# Patient Record
Sex: Male | Born: 1945
Health system: Southern US, Community
[De-identification: ages and names within clinical notes are randomized; demographics above are authoritative.]

## PROBLEM LIST (undated history)

## (undated) DIAGNOSIS — K760 Fatty (change of) liver, not elsewhere classified: Secondary | ICD-10-CM

## (undated) DIAGNOSIS — N400 Enlarged prostate without lower urinary tract symptoms: Secondary | ICD-10-CM

## (undated) DIAGNOSIS — M1990 Unspecified osteoarthritis, unspecified site: Secondary | ICD-10-CM

## (undated) DIAGNOSIS — E119 Type 2 diabetes mellitus without complications: Secondary | ICD-10-CM

## (undated) DIAGNOSIS — I2510 Atherosclerotic heart disease of native coronary artery without angina pectoris: Secondary | ICD-10-CM

## (undated) DIAGNOSIS — D649 Anemia, unspecified: Secondary | ICD-10-CM

## (undated) DIAGNOSIS — F419 Anxiety disorder, unspecified: Secondary | ICD-10-CM

## (undated) DIAGNOSIS — M51369 Other intervertebral disc degeneration, lumbar region without mention of lumbar back pain or lower extremity pain: Secondary | ICD-10-CM

## (undated) DIAGNOSIS — E785 Hyperlipidemia, unspecified: Secondary | ICD-10-CM

## (undated) DIAGNOSIS — J449 Chronic obstructive pulmonary disease, unspecified: Secondary | ICD-10-CM

## (undated) DIAGNOSIS — M5136 Other intervertebral disc degeneration, lumbar region: Secondary | ICD-10-CM

## (undated) DIAGNOSIS — K219 Gastro-esophageal reflux disease without esophagitis: Secondary | ICD-10-CM

## (undated) DIAGNOSIS — R0600 Dyspnea, unspecified: Secondary | ICD-10-CM

## (undated) DIAGNOSIS — I10 Essential (primary) hypertension: Secondary | ICD-10-CM

## (undated) DIAGNOSIS — R519 Headache, unspecified: Secondary | ICD-10-CM

## (undated) DIAGNOSIS — I4891 Unspecified atrial fibrillation: Secondary | ICD-10-CM

## (undated) HISTORY — DX: Unspecified atrial fibrillation: I48.91

## (undated) HISTORY — DX: Benign prostatic hyperplasia without lower urinary tract symptoms: N40.0

## (undated) HISTORY — DX: Other intervertebral disc degeneration, lumbar region without mention of lumbar back pain or lower extremity pain: M51.369

## (undated) HISTORY — DX: Other intervertebral disc degeneration, lumbar region: M51.36

## (undated) HISTORY — DX: Chronic obstructive pulmonary disease, unspecified: J44.9

## (undated) HISTORY — PX: TONSILLECTOMY: SUR1361

## (undated) HISTORY — DX: Fatty (change of) liver, not elsewhere classified: K76.0

## (undated) HISTORY — PX: HERNIA REPAIR: SHX51

## (undated) HISTORY — PX: BACK SURGERY: SHX140

---

## 2012-08-05 ENCOUNTER — Emergency Department (HOSPITAL_COMMUNITY): Payer: Medicare Other

## 2012-08-05 ENCOUNTER — Encounter (HOSPITAL_COMMUNITY): Payer: Self-pay | Admitting: Emergency Medicine

## 2012-08-05 ENCOUNTER — Emergency Department (HOSPITAL_COMMUNITY)
Admission: EM | Admit: 2012-08-05 | Discharge: 2012-08-05 | Disposition: A | Discharge status: Home / Self Care | Payer: Medicare Other | Attending: Emergency Medicine | Admitting: Emergency Medicine

## 2012-08-05 DIAGNOSIS — R350 Frequency of micturition: Secondary | ICD-10-CM | POA: Insufficient documentation

## 2012-08-05 DIAGNOSIS — M129 Arthropathy, unspecified: Secondary | ICD-10-CM | POA: Insufficient documentation

## 2012-08-05 DIAGNOSIS — Z79899 Other long term (current) drug therapy: Secondary | ICD-10-CM | POA: Insufficient documentation

## 2012-08-05 DIAGNOSIS — R300 Dysuria: Secondary | ICD-10-CM | POA: Insufficient documentation

## 2012-08-05 DIAGNOSIS — E119 Type 2 diabetes mellitus without complications: Secondary | ICD-10-CM | POA: Insufficient documentation

## 2012-08-05 DIAGNOSIS — N509 Disorder of male genital organs, unspecified: Secondary | ICD-10-CM | POA: Insufficient documentation

## 2012-08-05 DIAGNOSIS — E785 Hyperlipidemia, unspecified: Secondary | ICD-10-CM | POA: Insufficient documentation

## 2012-08-05 DIAGNOSIS — R310 Gross hematuria: Secondary | ICD-10-CM | POA: Insufficient documentation

## 2012-08-05 DIAGNOSIS — K219 Gastro-esophageal reflux disease without esophagitis: Secondary | ICD-10-CM | POA: Insufficient documentation

## 2012-08-05 DIAGNOSIS — F411 Generalized anxiety disorder: Secondary | ICD-10-CM | POA: Insufficient documentation

## 2012-08-05 DIAGNOSIS — I10 Essential (primary) hypertension: Secondary | ICD-10-CM | POA: Insufficient documentation

## 2012-08-05 DIAGNOSIS — R319 Hematuria, unspecified: Secondary | ICD-10-CM

## 2012-08-05 DIAGNOSIS — F17200 Nicotine dependence, unspecified, uncomplicated: Secondary | ICD-10-CM | POA: Insufficient documentation

## 2012-08-05 HISTORY — DX: Unspecified osteoarthritis, unspecified site: M19.90

## 2012-08-05 HISTORY — DX: Hyperlipidemia, unspecified: E78.5

## 2012-08-05 HISTORY — DX: Anxiety disorder, unspecified: F41.9

## 2012-08-05 HISTORY — DX: Gastro-esophageal reflux disease without esophagitis: K21.9

## 2012-08-05 HISTORY — DX: Type 2 diabetes mellitus without complications: E11.9

## 2012-08-05 HISTORY — DX: Essential (primary) hypertension: I10

## 2012-08-05 LAB — CBC WITH DIFFERENTIAL/PLATELET
Basophils Absolute: 0 10*3/uL (ref 0.0–0.1)
Basophils Relative: 0 % (ref 0–1)
Eosinophils Absolute: 0.2 10*3/uL (ref 0.0–0.7)
Eosinophils Relative: 2 % (ref 0–5)
HCT: 49.6 % (ref 39.0–52.0)
Hemoglobin: 16.9 g/dL (ref 13.0–17.0)
Lymphocytes Relative: 36 % (ref 12–46)
Lymphs Abs: 3.4 10*3/uL (ref 0.7–4.0)
MCH: 31.9 pg (ref 26.0–34.0)
MCHC: 34.1 g/dL (ref 30.0–36.0)
MCV: 93.8 fL (ref 78.0–100.0)
Monocytes Absolute: 0.7 10*3/uL (ref 0.1–1.0)
Monocytes Relative: 7 % (ref 3–12)
Neutro Abs: 5 10*3/uL (ref 1.7–7.7)
Neutrophils Relative %: 54 % (ref 43–77)
Platelets: 125 10*3/uL — ABNORMAL LOW (ref 150–400)
RBC: 5.29 MIL/uL (ref 4.22–5.81)
RDW: 12.9 % (ref 11.5–15.5)
WBC: 9.2 10*3/uL (ref 4.0–10.5)

## 2012-08-05 LAB — BASIC METABOLIC PANEL
BUN: 18 mg/dL (ref 6–23)
CO2: 26 mEq/L (ref 19–32)
Calcium: 9.9 mg/dL (ref 8.4–10.5)
Chloride: 99 mEq/L (ref 96–112)
Creatinine, Ser: 0.88 mg/dL (ref 0.50–1.35)
GFR calc Af Amer: >90 mL/min (ref >90)
GFR calc non Af Amer: 88 mL/min — ABNORMAL LOW (ref >90)
Glucose, Bld: 121 mg/dL — ABNORMAL HIGH (ref 70–99)
Potassium: 4.2 mEq/L (ref 3.5–5.1)
Sodium: 135 mEq/L (ref 135–145)

## 2012-08-05 LAB — URINALYSIS, ROUTINE W REFLEX MICROSCOPIC
Bilirubin Urine: NEGATIVE
Glucose, UA: NEGATIVE mg/dL
Ketones, ur: NEGATIVE mg/dL
Leukocytes, UA: NEGATIVE
Nitrite: NEGATIVE
Protein, ur: NEGATIVE mg/dL
Specific Gravity, Urine: 1.025 (ref 1.005–1.030)
Urobilinogen, UA: 0.2 mg/dL (ref 0.0–1.0)
pH: 5.5 (ref 5.0–8.0)

## 2012-08-05 LAB — URINE MICROSCOPIC-ADD ON

## 2012-08-05 MED ORDER — CIPROFLOXACIN HCL 500 MG PO TABS: 500.0000 mg | ORAL_TABLET | Freq: Two times a day (BID) | ORAL | Status: DC

## 2012-08-05 NOTE — ED Notes (Signed)
Pt c/o blood in urine that started yesterday. Pt states sometimes his urine is clear and sometimes is has blood/clots in it.

## 2012-08-05 NOTE — ED Provider Notes (Signed)
History   This chart was scribed for Nicolas Lyons, MD by Nicolas Berry, ED Scribe. The patient was seen in room APA07/APA07. Patient's care was started at 0912.  CSN: [id number]  Arrival date & time 08/05/12  4098   First MD Initiated Contact with Patient 08/05/12 [phone]      Chief Complaint  Patient presents with  . Hematuria   Patient is a 66 y.o. male presenting with hematuria. The history is provided by the patient. No language interpreter was used.  Hematuria This is a recurrent problem. The current episode started yesterday. The problem has been waxing and waning since onset. He describes the hematuria as gross hematuria. The hematuria occurs throughout his entire urinary stream. He reports clotting at the middle of his urine stream. The pain is mild. Urine color: bright red. Irritative symptoms do not include frequency or nocturia. Pertinent negatives include no abdominal pain, facial swelling, nausea or vomiting. He is sexually active. His past medical history is significant for hypertension and tobacco use. There is no history of kidney stones.    Nicolas Berry is a 66 y.o. male followed by the [facility] hospital who presents to the Emergency Department complaining of 1 day of recurrent, waxing and waning, moderate to sever bright red hematuria without dysuria. Pt typically experience mild hesitancy, increased frequency, and urgency at baseline, though symptoms represent a moderate deviation. He reports moderate testicular pain yesterday, though pain subsided prior to onset. Last night, Pt had "flush bright red, clot spotted" hematuria. The next 3 urination were described as abnormally clear, and this morning Pt's urine is persistently pink. Symptoms have not been treated PTA. Pt also reports blood in semen 2 weeks ago, after using a penile enlargement pump.No abdominal pain, fever, chills, cough, congestion, rhinorrhea, chest pain, SOB, or n/v/d. Pt denies h/o PSA testing or kidney stones.  Medical Hx includes HTN, GERD, Diabetes mellitus w/o complication, and hyperlipidemia. Pt is a current 2 pack/day smoker, admits the use of marijuana, and denies the consumption of alcohol.     Past Medical History  Diagnosis Date  . Hypertension   . Diabetes mellitus without complication   . GERD (gastroesophageal reflux disease)   . Hyperlipidemia   . Arthritis   . Anxiety     Past Surgical History  Procedure Date  . Hernia repair   . Back surgery     No family history on file.  History  Substance Use Topics  . Smoking status: Current Every Day Smoker -- 2.0 packs/day  . Smokeless tobacco: Not on file  . Alcohol Use: No      Review of Systems  HENT: Negative for facial swelling.   Gastrointestinal: Negative for nausea, vomiting and abdominal pain.  Genitourinary: Positive for hematuria and testicular pain. Negative for frequency and nocturia.  All other systems reviewed and are negative.    Allergies  Review of patient's allergies indicates no known allergies.  Home Medications   Current Outpatient Rx  Name  Route  Sig  Dispense  Refill  . ACARBOSE 50 MG PO TABS   Oral   Take 25 mg by mouth daily.         . ASPIRIN EC 81 MG PO TBEC   Oral   Take 81 mg by mouth daily.         Marland Kitchen CLONAZEPAM 0.5 MG PO TABS   Oral   Take 0.5 mg by mouth 2 (two) times daily as needed.         Marland Kitchen  HYDROCODONE-ACETAMINOPHEN 5-325 MG PO TABS   Oral   Take 1 tablet by mouth 2 (two) times daily.         Marland Kitchen LISINOPRIL 40 MG PO TABS   Oral   Take 40 mg by mouth daily.         Marland Kitchen METFORMIN HCL 1000 MG PO TABS   Oral   Take 1,000 mg by mouth 2 (two) times daily.         Marland Kitchen FISH OIL 1000 MG PO CAPS   Oral   Take 1,000 mg by mouth daily.          Marland Kitchen OMEPRAZOLE 20 MG PO CPDR   Oral   Take 20 mg by mouth daily.         Marland Kitchen SIMVASTATIN 80 MG PO TABS   Oral   Take 40 mg by mouth at bedtime.         Marland Kitchen VITAMIN D (ERGOCALCIFEROL) 50000 UNITS PO CAPS   Oral    Take 50,000 Units by mouth every 30 (thirty) days.           BP 167/85  Pulse 83  Temp 97.7 F (36.5 C) (Oral)  Resp 15  Ht 5\' 10"  (1.778 m)  Wt 189 lb (85.73 kg)  BMI 27.12 kg/m2  SpO2 97%  Physical Exam  Constitutional: He is oriented to person, place, and time. He appears well-developed and well-nourished.  HENT:  Head: Normocephalic and atraumatic.  Mouth/Throat: No oropharyngeal exudate.  Eyes: EOM are normal. Pupils are equal, round, and reactive to light. No scleral icterus.  Neck: Normal range of motion. Neck supple. No tracheal deviation present.  Cardiovascular: Normal rate, regular rhythm and normal heart sounds.   No murmur heard. Pulmonary/Chest: Effort normal and breath sounds normal. No respiratory distress. He has no wheezes.  Abdominal: Soft. He exhibits no mass. There is no rebound and no guarding.       Mild LLQ tenderness to palpation.  Musculoskeletal: Normal range of motion.       No CVA tenderness.  Neurological: He is alert and oriented to person, place, and time. No cranial nerve deficit.  Skin: Skin is warm and dry. No rash noted.  Psychiatric: He has a normal mood and affect. His behavior is normal.    ED Course  Procedures DIAGNOSTIC STUDIES: Oxygen Saturation is 97% on room air, normal by my interpretation.    COORDINATION OF CARE: 09:45- Evaluated Pt. Pt is awake, alert, and without distress. 09:56- Ordered CT Abdomen Pelvis Wo Contrast 1 time imaging.    Labs Reviewed  URINALYSIS, ROUTINE W REFLEX MICROSCOPIC - Abnormal; Notable for the following:    Hgb urine dipstick LARGE (*)     All other components within normal limits  CBC WITH DIFFERENTIAL - Abnormal; Notable for the following:    Platelets 125 (*)     All other components within normal limits  BASIC METABOLIC PANEL - Abnormal; Notable for the following:    Glucose, Bld 121 (*)     GFR calc non Af Amer 88 (*)     All other components within normal limits  URINE  MICROSCOPIC-ADD ON   Ct Abdomen Pelvis Wo Contrast  08/05/2012  *RADIOLOGY REPORT*  Clinical Data: Low back pain and hematuria.  CT ABDOMEN AND PELVIS WITHOUT CONTRAST  Technique:  Multidetector CT imaging of the abdomen and pelvis was performed following the standard protocol without intravenous contrast.  Comparison: None.  Findings: Lung bases are clear.  There  is no evidence for free air.  Unenhanced CT was performed per clinician order.  Lack of IV contrast limits sensitivity and specificity, especially for evaluation of abdominal/pelvic solid viscera.  There is no evidence for kidney stones or hydronephrosis.  There is diffuse low density throughout the liver parenchyma suggesting hepatic steatosis.  In addition, there are multiple low- density structures scattered throughout the liver parenchyma.  The largest lesion is in the inferior right hepatic lobe and measures up to 3.4 cm.  The Hounsfield units of the largest lesions are water attenuation.  These findings are indeterminate but most likely represent hepatic cysts.  Normal appearance of the spleen.  There is a 0.4 cm low density structure near the pancreatic head and neck.  Hounsfield units suggest that this may be a fatty lesion.  There are mitral annular calcifications.  No gross abnormality to the stomach, duodenum, adrenal glands or gallbladder.  There are prominent lymph nodes in the upper abdomen within the gastrohepatic ligament.  Largest node roughly measures 1.6 cm in the short axis on sequence #3, image 21. Precaval lymph node measures 1.2 cm in short axis on image 28. There are atherosclerotic calcifications involving the abdominal aorta without aneurysmal dilatation.  Right iliac lymph node measures 0.8 cm in the short axis on image 69.  Prostate is prominent for size and measures 5.2 cm in the transverse dimension with calcifications.  There is diverticulosis involving the sigmoid colon without evidence of acute inflammation. Normal  appearance of the appendix.  No evidence for bowel dilatation.  There is irregularity involving the right iliac wing this may be postsurgical or post-traumatic.  Hypertrophy of the facets in the lower lumbar spine consistent with degenerative changes.  There is a disc space loss at L5-S1 and disc bulge at L4-L5 with canal narrowing at this level.  IMPRESSION: No evidence for kidney stones.  Degenerative facet and disc disease in the lower lumbar spine. There is concern for central canal narrowing at L4-L5 as described. This could be further evaluated with a lumbar spine MRI if needed.  Diffuse low density of the liver is suggestive for hepatic steatosis.  There are multiple low-density structures throughout the liver and the largest have water attenuation.  Findings most likely represent cysts although they are indeterminate on this noncontrast examination.  In addition, the patient has prominent lymph nodes in the upper abdomen.  These findings are nonspecific but could be reactive.  Please correlate with any history of malignancy.  If there is concern for a neoplastic process, recommend further evaluation with MRI.  0.4 cm low density structure in the pancreatic neck is indeterminate and could represent focal fat.  Diverticulosis without acute inflammation.   Original Report Authenticated By: Richarda Overlie, M.D.      No diagnosis found.    MDM  The patient presents with painless hematuria.  The ua shows blood but no infection.  The ct reveals an enlarged prostate but no other acute process.  I will treat him with cipro for presumptive prostatitis and arrange follow up with urology in the next few days.        I personally performed the services described in this documentation, which was scribed in my presence. The recorded information has been reviewed and is accurate.      Nicolas Lyons, MD 08/05/12 1315

## 2012-08-23 ENCOUNTER — Other Ambulatory Visit (HOSPITAL_COMMUNITY): Payer: Self-pay | Admitting: Family Medicine

## 2012-08-23 DIAGNOSIS — R9389 Abnormal findings on diagnostic imaging of other specified body structures: Secondary | ICD-10-CM

## 2012-08-25 ENCOUNTER — Ambulatory Visit (HOSPITAL_COMMUNITY)
Admission: RE | Admit: 2012-08-25 | Discharge: 2012-08-25 | Disposition: A | Discharge status: Home / Self Care | Payer: Medicare Other | Source: Ambulatory Visit | Attending: Family Medicine | Admitting: Family Medicine

## 2012-08-25 DIAGNOSIS — R310 Gross hematuria: Secondary | ICD-10-CM | POA: Insufficient documentation

## 2012-08-25 DIAGNOSIS — R9389 Abnormal findings on diagnostic imaging of other specified body structures: Secondary | ICD-10-CM | POA: Insufficient documentation

## 2012-08-25 DIAGNOSIS — K7689 Other specified diseases of liver: Secondary | ICD-10-CM | POA: Insufficient documentation

## 2012-08-25 MED ORDER — GADOBENATE DIMEGLUMINE 529 MG/ML IV SOLN
17.0000 mL | Freq: Once | INTRAVENOUS | Status: AC | PRN
  Administered 2012-08-25: 17 mL via INTRAVENOUS

## 2012-09-09 ENCOUNTER — Ambulatory Visit (INDEPENDENT_AMBULATORY_CARE_PROVIDER_SITE_OTHER): Payer: Medicare Other | Admitting: Family Medicine

## 2012-09-09 ENCOUNTER — Encounter: Payer: Self-pay | Admitting: Family Medicine

## 2012-09-09 VITALS — BP 124/72 | HR 93 | Resp 18 | Ht 69.0 in | Wt 185.1 lb

## 2012-09-09 DIAGNOSIS — I10 Essential (primary) hypertension: Secondary | ICD-10-CM | POA: Insufficient documentation

## 2012-09-09 DIAGNOSIS — Z720 Tobacco use: Secondary | ICD-10-CM | POA: Insufficient documentation

## 2012-09-09 DIAGNOSIS — F411 Generalized anxiety disorder: Secondary | ICD-10-CM

## 2012-09-09 DIAGNOSIS — J449 Chronic obstructive pulmonary disease, unspecified: Secondary | ICD-10-CM

## 2012-09-09 DIAGNOSIS — E119 Type 2 diabetes mellitus without complications: Secondary | ICD-10-CM | POA: Insufficient documentation

## 2012-09-09 DIAGNOSIS — F17200 Nicotine dependence, unspecified, uncomplicated: Secondary | ICD-10-CM

## 2012-09-09 DIAGNOSIS — M159 Polyosteoarthritis, unspecified: Secondary | ICD-10-CM | POA: Insufficient documentation

## 2012-09-09 DIAGNOSIS — N400 Enlarged prostate without lower urinary tract symptoms: Secondary | ICD-10-CM | POA: Insufficient documentation

## 2012-09-09 DIAGNOSIS — E785 Hyperlipidemia, unspecified: Secondary | ICD-10-CM

## 2012-09-09 DIAGNOSIS — F419 Anxiety disorder, unspecified: Secondary | ICD-10-CM | POA: Insufficient documentation

## 2012-09-09 NOTE — Assessment & Plan Note (Signed)
He is being followed by urology. For hematuria as well as BPH. I will obtain records regarding this he is due for cystoscopy

## 2012-09-09 NOTE — Patient Instructions (Signed)
I will obtain records from Dr.Tapper/ Dr .Donnetta Hail Refer Gastroenterology for colonoscopy and swallowing Take the mirlax/PEG once a day in water or clear juice for bowel Work on the smoking! I will get your last set of labs  F/U 2 months for diabetes

## 2012-09-09 NOTE — Assessment & Plan Note (Signed)
He is a chronic smoker's cough. He smokes 2 packs a day. He would need an update PFT. I will obtain his records he states that he is on Symbicort and albuterol his pharmacy only has the Provera he may be getting the Symbicort from the [employer].

## 2012-09-09 NOTE — Assessment & Plan Note (Signed)
Continue statin therapy.

## 2012-09-09 NOTE — Assessment & Plan Note (Signed)
Advise him to take medications as prescribed. I will obtain his last set of labs and his A1c his fasting blood sugars look good.

## 2012-09-09 NOTE — Assessment & Plan Note (Signed)
Blood pressure looks good today we'll continue lisinopril at current dose.

## 2012-09-09 NOTE — Assessment & Plan Note (Signed)
We discussed tobacco cessation he has a very heavy smoker. He states that he wants to quit cold [place].

## 2012-09-09 NOTE — Assessment & Plan Note (Signed)
He'll continue his Norco prescribed by the [place], he typically takes this twice a day

## 2012-09-09 NOTE — Assessment & Plan Note (Signed)
He states that he has done very well. Advised not to stop the benzodiazepines cold [place] because of withdrawal symptoms. He can decrease to one tablet a day for while and we will see how he does at the next visit before stopping completely

## 2012-09-09 NOTE — Progress Notes (Signed)
  Subjective:    Patient ID: Nicolas Berry, male    DOB: 26-Jul-1946, 66 y.o.   MRN: [id number]  HPI Patient presents to establish care. Previous PCP Dr. Margo Common in [facility]. He is also followed at the [facility]. He was in the branch of the [employer] for 3 years. He made a career out of Warden/ranger.  Medications and history reviewed.  He is due for colonoscopy. He also has episodes of his pills and food getting stuck upon swallowing. He has pain after he eats most days. He uses omeprazole daily.  Anxiety his history of anxiety secondary to some stressful moments in his life. He's been taking Klonopin twice a day. He's never been on any long-acting medication. He wants to start tapering off of this. He does not think he needs at this time.  Chronic cough he has history of chronic cough initially did not state he has COPD but states he uses out the albuterol/ Symbicort inhaler. He smokes 2 packs per day. He's had a PFT to see this was done many years ago.  Diabetes mellitus states his last A1c was 7.1% he was started on Acarbose at the last visit he has been on metformin 1 g twice a day. His fasting blood sugars are typically 100-120. He does not take acarbose daily as prescribed  Chronic pain- history of degenerative disc disease and knee pain. He's on hydrocodone prescribed by the [place]. He also has osteoarthritis.  Hematuria his being worked up currently by urology for hematuria. Initially started on November 9 after coming back from her trip. He has CT scan which revealed fatty liver and enlarged prostate. He also has benign cyst on his liver. He is due for cystoscopy in January with [facility] Review of Systems  GEN- denies fatigue, fever, weight loss,weakness, recent illness HEENT- denies eye drainage, change in vision, nasal discharge, CVS- denies chest pain, palpitations RESP- denies SOB,+ cough, wheeze ABD- denies N/V, change in stools, abd  pain GU- denies dysuria, hematuria, dribbling, incontinence MSK- denies joint pain, muscle aches, injury Neuro- denies headache, dizziness, syncope, seizure activity, occ tingling numbness of feet       Objective:   Physical Exam GEN- NAD, alert and oriented x3 HEENT- PERRL, EOMI, non injected sclera, pink conjunctiva, MMM, oropharynx clear Neck- Supple,  CVS- RRR, no murmur RESP-CTAB, harsh smokers cough ABD-NABS,Soft,NT,ND  EXT- No edema Pulses- Radial, DP- 2+ Psych- normal affect and mood Diabetic- no open lesion, normal monofilament        Assessment & Plan:

## 2012-10-07 ENCOUNTER — Ambulatory Visit (INDEPENDENT_AMBULATORY_CARE_PROVIDER_SITE_OTHER): Payer: Medicare Other | Admitting: Urology

## 2012-10-07 DIAGNOSIS — N138 Other obstructive and reflux uropathy: Secondary | ICD-10-CM

## 2012-10-07 DIAGNOSIS — R310 Gross hematuria: Secondary | ICD-10-CM

## 2012-10-07 DIAGNOSIS — N529 Male erectile dysfunction, unspecified: Secondary | ICD-10-CM

## 2012-10-07 DIAGNOSIS — N401 Enlarged prostate with lower urinary tract symptoms: Secondary | ICD-10-CM

## 2013-02-07 ENCOUNTER — Ambulatory Visit (INDEPENDENT_AMBULATORY_CARE_PROVIDER_SITE_OTHER): Payer: Medicare Other | Admitting: Urology

## 2013-02-07 DIAGNOSIS — R310 Gross hematuria: Secondary | ICD-10-CM

## 2013-02-07 DIAGNOSIS — N400 Enlarged prostate without lower urinary tract symptoms: Secondary | ICD-10-CM

## 2013-02-07 DIAGNOSIS — N529 Male erectile dysfunction, unspecified: Secondary | ICD-10-CM

## 2013-04-07 ENCOUNTER — Ambulatory Visit: Payer: Medicare Other | Admitting: Urology

## 2013-05-16 ENCOUNTER — Ambulatory Visit (INDEPENDENT_AMBULATORY_CARE_PROVIDER_SITE_OTHER): Payer: Medicare Other | Admitting: Urology

## 2013-05-16 DIAGNOSIS — N400 Enlarged prostate without lower urinary tract symptoms: Secondary | ICD-10-CM

## 2014-05-29 ENCOUNTER — Ambulatory Visit: Payer: Medicare Other | Admitting: Urology

## 2014-07-10 ENCOUNTER — Ambulatory Visit (INDEPENDENT_AMBULATORY_CARE_PROVIDER_SITE_OTHER): Payer: Medicare Other | Admitting: Urology

## 2014-07-10 DIAGNOSIS — R351 Nocturia: Secondary | ICD-10-CM

## 2014-07-10 DIAGNOSIS — N400 Enlarged prostate without lower urinary tract symptoms: Secondary | ICD-10-CM

## 2015-07-30 ENCOUNTER — Ambulatory Visit: Payer: Medicare Other | Admitting: Urology

## 2016-05-17 ENCOUNTER — Inpatient Hospital Stay (HOSPITAL_COMMUNITY)
Admission: EM | Admit: 2016-05-17 | Discharge: 2016-05-19 | DRG: 309 | Disposition: A | Discharge status: Home / Self Care | Payer: Medicare Other | Attending: Internal Medicine | Admitting: Internal Medicine

## 2016-05-17 ENCOUNTER — Encounter (HOSPITAL_COMMUNITY): Payer: Self-pay | Admitting: Emergency Medicine

## 2016-05-17 ENCOUNTER — Emergency Department (HOSPITAL_COMMUNITY): Payer: Medicare Other

## 2016-05-17 DIAGNOSIS — I4891 Unspecified atrial fibrillation: Principal | ICD-10-CM

## 2016-05-17 DIAGNOSIS — Z833 Family history of diabetes mellitus: Secondary | ICD-10-CM

## 2016-05-17 DIAGNOSIS — Z720 Tobacco use: Secondary | ICD-10-CM | POA: Diagnosis present

## 2016-05-17 DIAGNOSIS — I10 Essential (primary) hypertension: Secondary | ICD-10-CM | POA: Diagnosis present

## 2016-05-17 DIAGNOSIS — Z7984 Long term (current) use of oral hypoglycemic drugs: Secondary | ICD-10-CM

## 2016-05-17 DIAGNOSIS — I248 Other forms of acute ischemic heart disease: Secondary | ICD-10-CM | POA: Diagnosis present

## 2016-05-17 DIAGNOSIS — N400 Enlarged prostate without lower urinary tract symptoms: Secondary | ICD-10-CM | POA: Diagnosis not present

## 2016-05-17 DIAGNOSIS — J449 Chronic obstructive pulmonary disease, unspecified: Secondary | ICD-10-CM | POA: Diagnosis not present

## 2016-05-17 DIAGNOSIS — E119 Type 2 diabetes mellitus without complications: Secondary | ICD-10-CM | POA: Diagnosis not present

## 2016-05-17 DIAGNOSIS — Z23 Encounter for immunization: Secondary | ICD-10-CM

## 2016-05-17 DIAGNOSIS — K219 Gastro-esophageal reflux disease without esophagitis: Secondary | ICD-10-CM | POA: Diagnosis present

## 2016-05-17 DIAGNOSIS — J439 Emphysema, unspecified: Secondary | ICD-10-CM | POA: Diagnosis not present

## 2016-05-17 DIAGNOSIS — I480 Paroxysmal atrial fibrillation: Secondary | ICD-10-CM | POA: Diagnosis not present

## 2016-05-17 DIAGNOSIS — Z79891 Long term (current) use of opiate analgesic: Secondary | ICD-10-CM

## 2016-05-17 DIAGNOSIS — D72829 Elevated white blood cell count, unspecified: Secondary | ICD-10-CM | POA: Diagnosis present

## 2016-05-17 DIAGNOSIS — Z7982 Long term (current) use of aspirin: Secondary | ICD-10-CM

## 2016-05-17 DIAGNOSIS — K5900 Constipation, unspecified: Secondary | ICD-10-CM | POA: Diagnosis present

## 2016-05-17 DIAGNOSIS — M159 Polyosteoarthritis, unspecified: Secondary | ICD-10-CM | POA: Diagnosis present

## 2016-05-17 DIAGNOSIS — R7989 Other specified abnormal findings of blood chemistry: Secondary | ICD-10-CM

## 2016-05-17 DIAGNOSIS — F17210 Nicotine dependence, cigarettes, uncomplicated: Secondary | ICD-10-CM | POA: Diagnosis present

## 2016-05-17 DIAGNOSIS — I959 Hypotension, unspecified: Secondary | ICD-10-CM | POA: Diagnosis present

## 2016-05-17 DIAGNOSIS — E785 Hyperlipidemia, unspecified: Secondary | ICD-10-CM | POA: Diagnosis present

## 2016-05-17 DIAGNOSIS — Z8052 Family history of malignant neoplasm of bladder: Secondary | ICD-10-CM

## 2016-05-17 DIAGNOSIS — Z7951 Long term (current) use of inhaled steroids: Secondary | ICD-10-CM

## 2016-05-17 DIAGNOSIS — R778 Other specified abnormalities of plasma proteins: Secondary | ICD-10-CM | POA: Diagnosis present

## 2016-05-17 DIAGNOSIS — R079 Chest pain, unspecified: Secondary | ICD-10-CM

## 2016-05-17 DIAGNOSIS — K760 Fatty (change of) liver, not elsewhere classified: Secondary | ICD-10-CM | POA: Diagnosis not present

## 2016-05-17 DIAGNOSIS — Z7901 Long term (current) use of anticoagulants: Secondary | ICD-10-CM

## 2016-05-17 DIAGNOSIS — R0602 Shortness of breath: Secondary | ICD-10-CM | POA: Diagnosis not present

## 2016-05-17 LAB — URINALYSIS, ROUTINE W REFLEX MICROSCOPIC
Bilirubin Urine: NEGATIVE
Glucose, UA: NEGATIVE mg/dL
Hgb urine dipstick: NEGATIVE
Leukocytes, UA: NEGATIVE
Nitrite: NEGATIVE
Protein, ur: NEGATIVE mg/dL
Specific Gravity, Urine: 1.02 (ref 1.005–1.030)
pH: 5.5 (ref 5.0–8.0)

## 2016-05-17 LAB — CBC
HCT: 46.3 % (ref 39.0–52.0)
Hemoglobin: 15.5 g/dL (ref 13.0–17.0)
MCH: 32 pg (ref 26.0–34.0)
MCHC: 33.5 g/dL (ref 30.0–36.0)
MCV: 95.7 fL (ref 78.0–100.0)
Platelets: 156 10*3/uL (ref 150–400)
RBC: 4.84 MIL/uL (ref 4.22–5.81)
RDW: 13.4 % (ref 11.5–15.5)
WBC: 13.4 10*3/uL — ABNORMAL HIGH (ref 4.0–10.5)

## 2016-05-17 LAB — BASIC METABOLIC PANEL
Anion gap: 7 (ref 5–15)
BUN: 25 mg/dL — ABNORMAL HIGH (ref 6–20)
CO2: 23 mmol/L (ref 22–32)
Calcium: 9.4 mg/dL (ref 8.9–10.3)
Chloride: 107 mmol/L (ref 101–111)
Creatinine, Ser: 1.24 mg/dL (ref 0.61–1.24)
GFR calc Af Amer: >60 mL/min (ref >60)
GFR calc non Af Amer: 58 mL/min — ABNORMAL LOW (ref >60)
Glucose, Bld: 106 mg/dL — ABNORMAL HIGH (ref 65–99)
Potassium: 4 mmol/L (ref 3.5–5.1)
Sodium: 137 mmol/L (ref 135–145)

## 2016-05-17 LAB — PROTIME-INR
INR: 1.05
Prothrombin Time: 13.7 seconds (ref 11.4–15.2)

## 2016-05-17 LAB — TSH: TSH: 2.164 u[IU]/mL (ref 0.350–4.500)

## 2016-05-17 LAB — APTT: aPTT: 34 seconds (ref 24–36)

## 2016-05-17 LAB — D-DIMER, QUANTITATIVE: D-Dimer, Quant: 0.44 ug/mL-FEU (ref 0.00–0.50)

## 2016-05-17 LAB — MAGNESIUM: Magnesium: 1.5 mg/dL — ABNORMAL LOW (ref 1.7–2.4)

## 2016-05-17 LAB — TROPONIN I: Troponin I: 0.03 ng/mL (ref <0.03)

## 2016-05-17 MED ORDER — FINASTERIDE 5 MG PO TABS
5.0000 mg | ORAL_TABLET | Freq: Every day | ORAL | Status: DC
  Administered 2016-05-18 – 2016-05-19 (×2): 5 mg via ORAL
  Filled 2016-05-17 (×3): qty 1

## 2016-05-17 MED ORDER — CLONAZEPAM 0.5 MG PO TABS: 0.5000 mg | ORAL_TABLET | Freq: Two times a day (BID) | ORAL | Status: DC | PRN

## 2016-05-17 MED ORDER — MOMETASONE FURO-FORMOTEROL FUM 200-5 MCG/ACT IN AERO
2.0000 | INHALATION_SPRAY | Freq: Two times a day (BID) | RESPIRATORY_TRACT | Status: DC
  Administered 2016-05-18 – 2016-05-19 (×2): 2 via RESPIRATORY_TRACT
  Filled 2016-05-17: qty 8.8

## 2016-05-17 MED ORDER — ASPIRIN EC 81 MG PO TBEC
81.0000 mg | DELAYED_RELEASE_TABLET | Freq: Every day | ORAL | Status: DC
  Administered 2016-05-18 – 2016-05-19 (×2): 81 mg via ORAL
  Filled 2016-05-17 (×2): qty 1

## 2016-05-17 MED ORDER — HEPARIN BOLUS VIA INFUSION
4000.0000 [IU] | Freq: Once | INTRAVENOUS | Status: AC
  Administered 2016-05-17: 4000 [IU] via INTRAVENOUS

## 2016-05-17 MED ORDER — ASPIRIN 81 MG PO CHEW
324.0000 mg | CHEWABLE_TABLET | Freq: Once | ORAL | Status: AC
  Administered 2016-05-17: 324 mg via ORAL
  Filled 2016-05-17: qty 4

## 2016-05-17 MED ORDER — SODIUM CHLORIDE 0.9 % IV SOLN
INTRAVENOUS | Status: DC
  Administered 2016-05-17: 10 mL/h via INTRAVENOUS
  Administered 2016-05-18: 16:00:00 via INTRAVENOUS

## 2016-05-17 MED ORDER — INSULIN ASPART 100 UNIT/ML ~~LOC~~ SOLN
0.0000 [IU] | Freq: Three times a day (TID) | SUBCUTANEOUS | Status: DC
  Administered 2016-05-18 – 2016-05-19 (×2): 2 [IU] via SUBCUTANEOUS

## 2016-05-17 MED ORDER — DILTIAZEM HCL 100 MG IV SOLR
5.0000 mg/h | INTRAVENOUS | Status: DC
  Administered 2016-05-17: 5 mg/h via INTRAVENOUS
  Filled 2016-05-17: qty 100

## 2016-05-17 MED ORDER — ATORVASTATIN CALCIUM 40 MG PO TABS
40.0000 mg | ORAL_TABLET | Freq: Every day | ORAL | Status: DC
  Administered 2016-05-18 – 2016-05-19 (×2): 40 mg via ORAL
  Filled 2016-05-17 (×2): qty 1

## 2016-05-17 MED ORDER — DILTIAZEM HCL 100 MG IV SOLR
5.0000 mg/h | INTRAVENOUS | Status: DC
  Administered 2016-05-17: 7.5 mg/h via INTRAVENOUS

## 2016-05-17 MED ORDER — ONDANSETRON HCL 4 MG/2ML IJ SOLN
4.0000 mg | Freq: Four times a day (QID) | INTRAMUSCULAR | Status: DC | PRN
Start: 2016-05-17 — End: 2016-05-19

## 2016-05-17 MED ORDER — SODIUM CHLORIDE 0.9 % IV BOLUS (SEPSIS)
500.0000 mL | Freq: Once | INTRAVENOUS | Status: AC
  Administered 2016-05-17: 500 mL via INTRAVENOUS

## 2016-05-17 MED ORDER — HEPARIN (PORCINE) IN NACL 100-0.45 UNIT/ML-% IJ SOLN
1000.0000 [IU]/h | INTRAMUSCULAR | Status: DC
  Administered 2016-05-17 – 2016-05-18 (×2): 1000 [IU]/h via INTRAVENOUS
  Filled 2016-05-17 (×2): qty 250

## 2016-05-17 MED ORDER — OXYCODONE-ACETAMINOPHEN 5-325 MG PO TABS: 1.0000 | ORAL_TABLET | Freq: Four times a day (QID) | ORAL | Status: DC | PRN

## 2016-05-17 MED ORDER — PNEUMOCOCCAL VAC POLYVALENT 25 MCG/0.5ML IJ INJ
0.5000 mL | INJECTION | INTRAMUSCULAR | Status: AC
  Administered 2016-05-18: 0.5 mL via INTRAMUSCULAR
  Filled 2016-05-17: qty 0.5

## 2016-05-17 MED ORDER — DILTIAZEM LOAD VIA INFUSION
10.0000 mg | Freq: Once | INTRAVENOUS | Status: AC
Start: 2016-05-17 — End: 2016-05-17
  Administered 2016-05-17: 10 mg via INTRAVENOUS
  Filled 2016-05-17: qty 10

## 2016-05-17 NOTE — ED Notes (Signed)
Attempted to call report to ICU at 2016 was told they would call me back. Re-attempt to call report made by myself at this time spoke with Jeneen Rinks who said it'd be another half an hour.

## 2016-05-17 NOTE — ED Provider Notes (Signed)
[name] DEPT Provider Note   CSN: [id number] Arrival date & time: 05/17/16  1621     History   Chief Complaint Chief Complaint  Patient presents with  . Hypotension    HPI Nicolas Berry is a 70 y.o. male.  The history is provided by the patient and the spouse.  Patient presents with generalized weakness. States he feels bad. States that he became dizzy. States he has felt like this over the last few weeks when he takes his lisinopril and metformin. States it got worse today. Does not feel his heart racing. He has had swelling in his right leg and needs a knee replacement. Also has some shortness of breath. States he was golfing today and began to feel worse. No real chest pain. Has swelling in his right knee. No fevers or chills.  Found to be in new onset A. fib with RVR. No history of A. fib. Chadsvasc score of 3 for age hypertension and diabetes.  Past Medical History:  Diagnosis Date  . Anxiety    OA multiple joints  . Arthritis   . BPH (benign prostatic hyperplasia)   . COPD (chronic obstructive pulmonary disease) ([place])   . DDD (degenerative disc disease), lumbar   . Diabetes mellitus without complication ([place])   . Fatty liver   . GERD (gastroesophageal reflux disease)   . Hyperlipidemia   . Hypertension     Patient Active Problem List   Diagnosis Date Noted  . Diabetes mellitus ([place]) 09/09/2012  . Essential hypertension, benign 09/09/2012  . Hyperlipidemia 09/09/2012  . BPH (benign prostatic hyperplasia) 09/09/2012  . COPD (chronic obstructive pulmonary disease) ([place]) 09/09/2012  . Generalized OA 09/09/2012  . Anxiety 09/09/2012  . Tobacco use 09/09/2012    Past Surgical History:  Procedure Laterality Date  . BACK SURGERY    . HERNIA REPAIR         Home Medications    Prior to Admission medications   Medication Sig Start Date End Date Taking? Authorizing Provider  acarbose (PRECOSE) 50 MG tablet Take 25 mg by mouth daily.   Yes Historical  Provider, MD  albuterol (PROVENTIL HFA;VENTOLIN HFA) 108 (90 BASE) MCG/ACT inhaler Inhale 2 puffs into the lungs every 4 (four) hours as needed.   Yes Historical Provider, MD  aspirin EC 81 MG tablet Take 81 mg by mouth daily.   Yes Historical Provider, MD  budesonide-formoterol (SYMBICORT) 160-4.5 MCG/ACT inhaler Inhale 2 puffs into the lungs 2 (two) times daily. dOSE UNKNOWN   Yes Historical Provider, MD  clonazePAM (KLONOPIN) 0.5 MG tablet Take 0.5 mg by mouth 2 (two) times daily as needed.   Yes Historical Provider, MD  finasteride (PROSCAR) 5 MG tablet Take 5 mg by mouth daily.   Yes Historical Provider, MD  HYDROcodone-acetaminophen (NORCO/VICODIN) 5-325 MG per tablet Take 1 tablet by mouth every 6 (six) hours as needed.    Yes Historical Provider, MD  lisinopril (PRINIVIL,ZESTRIL) 40 MG tablet Take 40 mg by mouth daily.   Yes Historical Provider, MD  meloxicam (MOBIC) 15 MG tablet Take 7.5 mg by mouth daily.   Yes Historical Provider, MD  metFORMIN (GLUCOPHAGE) 1000 MG tablet Take 1,000 mg by mouth 2 (two) times daily.   Yes Historical Provider, MD  simvastatin (ZOCOR) 80 MG tablet Take 40 mg by mouth at bedtime.   Yes Historical Provider, MD  traZODone (DESYREL) 100 MG tablet Take 100 mg by mouth at bedtime.   Yes Historical Provider, MD  Vitamin D, Ergocalciferol, (DRISDOL)  50000 UNITS CAPS Take 50,000 Units by mouth every 30 (thirty) days.   Yes Historical Provider, MD    Family History Family History  Problem Relation Age of Onset  . Cancer Mother     bladder  . Diabetes Father   . Cancer Sister     breast     Social History Social History  Substance Use Topics  . Smoking status: Current Every Day Smoker    Packs/day: 2.00    Types: Cigarettes  . Smokeless tobacco: Never Used  . Alcohol use No     Allergies   Review of patient's allergies indicates no known allergies.   Review of Systems Review of Systems  Constitutional: Positive for fatigue. Negative for  activity change and appetite change.  Eyes: Negative for pain.  Respiratory: Positive for shortness of breath. Negative for chest tightness.   Cardiovascular: Negative for chest pain and leg swelling.  Gastrointestinal: Negative for abdominal pain, diarrhea, nausea and vomiting.  Genitourinary: Negative for flank pain.  Musculoskeletal: Negative for back pain and neck stiffness.  Skin: Negative for rash.  Neurological: Positive for weakness. Negative for numbness and headaches.  Psychiatric/Behavioral: Negative for behavioral problems.     Physical Exam Updated Vital Signs BP 94/72   Pulse 120   Temp 98.1 F (36.7 C) (Temporal)   Resp (!) 30   Ht 5\' 10"  (1.778 m)   Wt 150 lb (68 kg)   SpO2 94%   BMI 21.52 kg/m   Physical Exam  Constitutional: He appears well-developed.  HENT:  Head: Atraumatic.  Eyes: EOM are normal.  Neck: Neck supple.  Cardiovascular:  Irregular tachycardia  Pulmonary/Chest: Effort normal.  Abdominal: Soft. There is no tenderness.  Musculoskeletal: He exhibits edema.  Effusion of right knee. Slight swelling in the area around the knee.  Neurological: He is alert.  Skin: Skin is warm.  Psychiatric: He has a normal mood and affect.     ED Treatments / Results  Labs (all labs ordered are listed, but only abnormal results are displayed) Labs Reviewed  BASIC METABOLIC PANEL - Abnormal; Notable for the following:       Result Value   Glucose, Bld 106 (*)    BUN 25 (*)    GFR calc non Af Amer 58 (*)    All other components within normal limits  CBC - Abnormal; Notable for the following:    WBC 13.4 (*)    All other components within normal limits  TROPONIN I - Abnormal; Notable for the following:    Troponin I 0.03 (*)    All other components within normal limits  MAGNESIUM - Abnormal; Notable for the following:    Magnesium 1.5 (*)    All other components within normal limits  D-DIMER, QUANTITATIVE (NOT AT [facility])  APTT  PROTIME-INR  TSH    URINALYSIS, ROUTINE W REFLEX MICROSCOPIC (NOT AT [facility])  CBC  HEPARIN LEVEL (UNFRACTIONATED)  CBG MONITORING, ED    EKG  EKG Interpretation  Date/Time:  Sunday May 17 2016 16:37:57 EDT Ventricular Rate:  141 PR Interval:    QRS Duration: 66 QT Interval:  252 QTC Calculation: 385 R Axis:   -5 Text Interpretation:  Atrial fibrillation with rapid ventricular response Nonspecific ST abnormality Abnormal ECG Confirmed by Alvino Chapel  MD, Aslan Himes [phone]) on 05/17/2016 4:49:37 PM       Radiology Dg Chest Port 1 View  Result Date: 05/17/2016 CLINICAL DATA:  Worsening shortness of breath today with tachycardia. Smoker. EXAM: PORTABLE  CHEST 1 VIEW COMPARISON:  Limited correlation made with abdominal CT 08/05/2012. FINDINGS: 1713 hours. Lordotic positioning. The heart size and mediastinal contours are normal. The lungs are clear. There is no pleural effusion or pneumothorax. No acute osseous findings are identified. Telemetry leads overlie the chest. IMPRESSION: No active cardiopulmonary process. Electronically Signed   By: Richardean Sale M.D.   On: 05/17/2016 17:29    Procedures Procedures (including critical care time)  Medications Ordered in ED Medications  diltiazem (CARDIZEM) 1 mg/mL load via infusion 10 mg (10 mg Intravenous Bolus from Bag 05/17/16 1732)    And  diltiazem (CARDIZEM) 100 mg in dextrose 5 % 100 mL (1 mg/mL) infusion (7.5 mg/hr Intravenous Rate/Dose Change 05/17/16 1833)  heparin bolus via infusion 4,000 Units (not administered)    Followed by  heparin ADULT infusion 100 units/mL (25000 units/255mL sodium chloride 0.45%) (not administered)  sodium chloride 0.9 % bolus 500 mL (0 mLs Intravenous Stopped 05/17/16 1852)  aspirin chewable tablet 324 mg (324 mg Oral Given 05/17/16 1831)     Initial Impression / Assessment and Plan / ED Course  I have reviewed the triage vital signs and the nursing notes.  Pertinent labs & imaging results that were available during my  care of the patient were reviewed by me and considered in my medical decision making (see chart for details).  Clinical Course    Patient presents feeling weak. States that feeling on and off for a couple weeks. Occasional chest pain during that time 2. Found to be in new onset A. fib with RVR. Some hypertension. Heart rate improved with Cardizem. Troponin minimally elevated. EKG reassuring for ischemia. Will start heparin and Cardizem admit to internal medicine.  CRITICAL CARE Performed by: Mackie Pai Total critical care time: 30 minutes Critical care time was exclusive of separately billable procedures and treating other patients. Critical care was necessary to treat or prevent imminent or life-threatening deterioration. Critical care was time spent personally by me on the following activities: development of treatment plan with patient and/or surrogate as well as nursing, discussions with consultants, evaluation of patient's response to treatment, examination of patient, obtaining history from patient or surrogate, ordering and performing treatments and interventions, ordering and review of laboratory studies, ordering and review of radiographic studies, pulse oximetry and re-evaluation of patient's condition.   Final Clinical Impressions(s) / ED Diagnoses   Final diagnoses:  Atrial fibrillation with rapid ventricular response [facility])    New Prescriptions New Prescriptions   No medications on file     Davonna Belling, MD 05/17/16 [phone]

## 2016-05-17 NOTE — H&P (Signed)
TRH H&P   Patient Demographics:    Nicolas Berry, is a 70 y.o. male  MRN: [id number]  DOB - May 24, 1946  Admit Date - 05/17/2016  Outpatient Primary MD for the patient is TAPPER,DAVID B, MD   Patient coming from: Home   Chief Complaint  Patient presents with  . Hypotension      HPI:    Nicolas Berry  is a 70 y.o. male, With history of diabetes mellitus, hypertension, osteoarthritis, fatty liver, hyperlipidemia who came to the hospital after patient felt dizzy and generalized weakness while playing called. Patient says he feels like that he takes simvastatin and metformin. Today it became worse so he came to the hospital for further evaluation. He also had shortness of breath but no chest pain.  He denies passing out. No nausea vomiting or diarrhea.   In the ED EKG revealed new onset atrial fibrillation, with CHA2DS2VASc score of 3 Patient started on heparin infusion per pharmacy, IV Cardizem.    Review of systems:    In addition to the HPI above,  No Fever-chills, No Headache, No changes with Vision or hearing, No problems swallowing food or Liquids, No Abdominal pain, No Nausea or Vomiting, bowel movements are regular, No Blood in stool or Urine, No dysuria, No new skin rashes or bruises, No new joints pains-aches,  No new weakness, tingling, numbness in any extremity, No recent weight gain or loss, No polyuria, polydypsia or polyphagia, No significant Mental Stressors.  A full 10 point Review of Systems was done, except as stated above, all other Review of Systems were negative.   With Past History of the following :    Past Medical History:  Diagnosis Date  . Anxiety    OA multiple joints  . Arthritis   . BPH (benign prostatic hyperplasia)   . COPD (chronic obstructive pulmonary disease) ([place])   . DDD (degenerative disc disease), lumbar   . Diabetes mellitus without  complication ([place])   . Fatty liver   . GERD (gastroesophageal reflux disease)   . Hyperlipidemia   . Hypertension       Past Surgical History:  Procedure Laterality Date  . BACK SURGERY    . HERNIA REPAIR        Social History:     Social History  Substance Use Topics  . Smoking status: Current Every Day Smoker    Packs/day: 2.00    Types: Cigarettes  . Smokeless tobacco: Never Used  . Alcohol use No       Family History :     Family History  Problem Relation Age of Onset  . Cancer Mother     bladder  . Diabetes Father   . Cancer Sister     breast       Home Medications:   Prior to Admission medications   Medication Sig Start Date End Date Taking? Authorizing Provider  acarbose (PRECOSE) 50 MG tablet Take 25 mg by mouth daily.   Yes Historical Provider, MD  albuterol (PROVENTIL  HFA;VENTOLIN HFA) 108 (90 BASE) MCG/ACT inhaler Inhale 2 puffs into the lungs every 4 (four) hours as needed.   Yes Historical Provider, MD  aspirin EC 81 MG tablet Take 81 mg by mouth daily.   Yes Historical Provider, MD  budesonide-formoterol (SYMBICORT) 160-4.5 MCG/ACT inhaler Inhale 2 puffs into the lungs 2 (two) times daily. dOSE UNKNOWN   Yes Historical Provider, MD  clonazePAM (KLONOPIN) 0.5 MG tablet Take 0.5 mg by mouth 2 (two) times daily as needed.   Yes Historical Provider, MD  finasteride (PROSCAR) 5 MG tablet Take 5 mg by mouth daily.   Yes Historical Provider, MD  HYDROcodone-acetaminophen (NORCO/VICODIN) 5-325 MG per tablet Take 1 tablet by mouth every 6 (six) hours as needed.    Yes Historical Provider, MD  lisinopril (PRINIVIL,ZESTRIL) 40 MG tablet Take 40 mg by mouth daily.   Yes Historical Provider, MD  meloxicam (MOBIC) 15 MG tablet Take 7.5 mg by mouth daily.   Yes Historical Provider, MD  metFORMIN (GLUCOPHAGE) 1000 MG tablet Take 1,000 mg by mouth 2 (two) times daily.   Yes Historical Provider, MD  simvastatin (ZOCOR) 80 MG tablet Take 40 mg by mouth at bedtime.    Yes Historical Provider, MD  traZODone (DESYREL) 100 MG tablet Take 100 mg by mouth at bedtime.   Yes Historical Provider, MD  Vitamin D, Ergocalciferol, (DRISDOL) 50000 UNITS CAPS Take 50,000 Units by mouth every 30 (thirty) days.   Yes Historical Provider, MD     Allergies:    No Known Allergies   Physical Exam:   Vitals  Blood pressure 94/72, pulse 120, temperature 98.1 F (36.7 C), temperature source Temporal, resp. rate (!) 30, height 5\' 10"  (1.778 m), weight 68 kg (150 lb), SpO2 94 %.   1. General Caucasian male* lying in bed in NAD, cooperative with exam   2. Normal affect and insight, Awake Alert, Oriented X 3.  3. No F.N deficits, ALL C.Nerves Intact, Strength 5/5 all 4 extremities, Sensation intact all 4 extremities, Plantars down going.  4. Ears and Eyes appear Normal, Conjunctivae clear, PERRLA. Moist Oral Mucosa.  5. Supple Neck, No JVD, No cervical lymphadenopathy appriciated, No Carotid Bruits.  6. Symmetrical Chest wall movement, Good air movement bilaterally, CTAB.  7.  irregular rhythm, No Gallops, Rubs, 3/6 systolic murmur in the mitral area, No Parasternal Heave.No Leg edema  8. Positive Bowel Sounds, Abdomen Soft, No tenderness, No organomegaly appriciated,No rebound -guarding or rigidity.  9.  No Cyanosis, Normal Skin Turgor, No Skin Rash or Bruise.  10. Good muscle tone,   right knee joint swollen, nontender no warmth noted, no erythema, no effusions, Normal ROM.      Data Review:    CBC  Recent Labs Lab 05/17/16 1646  WBC 13.4*  HGB 15.5  HCT 46.3  PLT 156  MCV 95.7  MCH 32.0  MCHC 33.5  RDW 13.4   ------------------------------------------------------------------------------------------------------------------  Chemistries   Recent Labs Lab 05/17/16 1646  NA 137  K 4.0  CL 107  CO2 23  GLUCOSE 106*  BUN 25*  CREATININE 1.24  CALCIUM 9.4  MG 1.5*    ------------------------------------------------------------------------------------------------------------------  ------------------------------------------------------------------------------------------------------------------ GFR: Estimated Creatinine Clearance: 54.1 mL/min (by C-G formula based on SCr of 1.24 mg/dL). Liver Function Tests: No results for input(s): AST, ALT, ALKPHOS, BILITOT, PROT, ALBUMIN in the last 168 hours. No results for input(s): LIPASE, AMYLASE in the last 168 hours. No results for input(s): AMMONIA in the last 168 hours. Coagulation Profile:  Recent Labs  Lab 05/17/16 1646  INR 1.05   Cardiac Enzymes:  Recent Labs Lab 05/17/16 1646  TROPONINI 0.03*   Thyroid Function Tests:  Recent Labs  05/17/16 1714  TSH 2.164   Anemia Panel: No results for input(s): VITAMINB12, FOLATE, FERRITIN, TIBC, IRON, RETICCTPCT in the last 72 hours.  --------------------------------------------------------------------------------------------------------------- Urine analysis:    Component Value Date/Time   COLORURINE YELLOW 08/05/2012 0957   APPEARANCEUR CLEAR 08/05/2012 0957   LABSPEC 1.025 08/05/2012 0957   PHURINE 5.5 08/05/2012 0957   GLUCOSEU NEGATIVE 08/05/2012 0957   HGBUR LARGE (A) 08/05/2012 0957   BILIRUBINUR NEGATIVE 08/05/2012 0957   KETONESUR NEGATIVE 08/05/2012 0957   PROTEINUR NEGATIVE 08/05/2012 0957   UROBILINOGEN 0.2 08/05/2012 0957   NITRITE NEGATIVE 08/05/2012 0957   LEUKOCYTESUR NEGATIVE 08/05/2012 0957      ----------------------------------------------------------------------------------------------------------------   Imaging Results:    Dg Chest Port 1 View  Result Date: 05/17/2016 CLINICAL DATA:  Worsening shortness of breath today with tachycardia. Smoker. EXAM: PORTABLE CHEST 1 VIEW COMPARISON:  Limited correlation made with abdominal CT 08/05/2012. FINDINGS: 1713 hours. Lordotic positioning. The heart size and  mediastinal contours are normal. The lungs are clear. There is no pleural effusion or pneumothorax. No acute osseous findings are identified. Telemetry leads overlie the chest. IMPRESSION: No active cardiopulmonary process. Electronically Signed   By: Richardean Sale M.D.   On: 05/17/2016 17:29    My personal review of EKG: Atrial fibrillation    Assessment & Plan:    Active Problems:   Diabetes mellitus ([place])   Essential hypertension, benign   BPH (benign prostatic hyperplasia)   COPD (chronic obstructive pulmonary disease) ([facility])   Atrial fibrillation ([place])   1. New onset atrial fibrillation-patient presenting with new onset A. fib, started on heparin per pharmacy, Cardizem infusion. Admit to stepdown, check echo in and. Cardiology consultation.CHA2DS2VASc score is 3, patient will require lifelong anticoagulation .  2. Diabetes mellitus - hold metformin, start sliding scale insulin with NovoLog.  3. Hypertension -patient started on Cardizem, hold lisinopril at this time.  4. COPD - stable, patient is a smoker smokes 2 packs per day. Continue Symbicort  5.  Tobacco abuse- counsuled  for smoking   DVT  Prophylaxis-   patient on heparin    Family Communication: No family at bedside   Code Status:  Full code  Admission status: Observation    Time spent in minutes : 60 minutes   Kiyo Heal S M.D on 05/17/2016 at 7:54 PM  Between 7am to 7pm - Pager - [phone]. After 7pm go to [email or web address] - password [facility]  [facility] - Office  [phone]

## 2016-05-17 NOTE — ED Triage Notes (Signed)
Pt reports he played golf today and became dizzy while playing and just not feeling well.  Thought his blood pressure may have been high and went home and took his blood pressure medication.  Wife states pt's blood pressure has been running low, but he continues to take his meds as directed.

## 2016-05-17 NOTE — Progress Notes (Signed)
ANTICOAGULATION CONSULT NOTE - Initial Consult  Pharmacy Consult for heparin Indication: atrial fibrillation  No Known Allergies  Patient Measurements: Height: 5\' 10"  (177.8 cm) Weight: 150 lb (68 kg) IBW/kg (Calculated) : 73 HEPARIN DW (KG): 68  Vital Signs: Temp: 98.1 F (36.7 C) (08/27 1635) Temp Source: Temporal (08/27 1635) BP: 91/61 (08/27 1800) Pulse Rate: 106 (08/27 1830)  Labs:  Recent Labs  05/17/16 1646  HGB 15.5  HCT 46.3  PLT 156  APTT 34  LABPROT 13.7  INR 1.05  CREATININE 1.24  TROPONINI 0.03*    Estimated Creatinine Clearance: 54.1 mL/min (by C-G formula based on SCr of 1.24 mg/dL).   Medical History: Past Medical History:  Diagnosis Date  . Anxiety    OA multiple joints  . Arthritis   . BPH (benign prostatic hyperplasia)   . COPD (chronic obstructive pulmonary disease) ([place])   . DDD (degenerative disc disease), lumbar   . Diabetes mellitus without complication ([place])   . Fatty liver   . GERD (gastroesophageal reflux disease)   . Hyperlipidemia   . Hypertension     Medications:  See med rec  Assessment: 70 yo male presented with dizziness and not feeling well over last few weeks. His heart was racing and he had swelling in his right leg.  Has some SOB. Found to be in new onset Afib. Chadsvasc score of 3. Heparin to be initiated.  Goal of Therapy:  Heparin level 0.3-0.7 units/ml Monitor platelets by anticoagulation protocol: Yes   Plan:  Give 4000 units bolus x 1 Start heparin infusion at 1000 units/hr Check anti-Xa level in 6-8 hours and daily while on heparin Continue to monitor H&H and platelets  Isac Sarna, BS Vena [place], BCPS Clinical Pharmacist Pager [phone] 05/17/2016,6:40 PM

## 2016-05-17 NOTE — ED Notes (Signed)
Report given to [facility] in ICU, all questions answered

## 2016-05-18 ENCOUNTER — Observation Stay (HOSPITAL_BASED_OUTPATIENT_CLINIC_OR_DEPARTMENT_OTHER): Payer: Medicare Other

## 2016-05-18 DIAGNOSIS — I481 Persistent atrial fibrillation: Secondary | ICD-10-CM

## 2016-05-18 DIAGNOSIS — D72829 Elevated white blood cell count, unspecified: Secondary | ICD-10-CM | POA: Diagnosis not present

## 2016-05-18 DIAGNOSIS — I248 Other forms of acute ischemic heart disease: Secondary | ICD-10-CM | POA: Diagnosis not present

## 2016-05-18 DIAGNOSIS — K760 Fatty (change of) liver, not elsewhere classified: Secondary | ICD-10-CM | POA: Diagnosis not present

## 2016-05-18 DIAGNOSIS — Z8052 Family history of malignant neoplasm of bladder: Secondary | ICD-10-CM | POA: Diagnosis not present

## 2016-05-18 DIAGNOSIS — E119 Type 2 diabetes mellitus without complications: Secondary | ICD-10-CM | POA: Diagnosis not present

## 2016-05-18 DIAGNOSIS — Z833 Family history of diabetes mellitus: Secondary | ICD-10-CM | POA: Diagnosis not present

## 2016-05-18 DIAGNOSIS — K219 Gastro-esophageal reflux disease without esophagitis: Secondary | ICD-10-CM | POA: Diagnosis present

## 2016-05-18 DIAGNOSIS — E785 Hyperlipidemia, unspecified: Secondary | ICD-10-CM | POA: Diagnosis not present

## 2016-05-18 DIAGNOSIS — N400 Enlarged prostate without lower urinary tract symptoms: Secondary | ICD-10-CM | POA: Diagnosis not present

## 2016-05-18 DIAGNOSIS — I959 Hypotension, unspecified: Secondary | ICD-10-CM | POA: Diagnosis not present

## 2016-05-18 DIAGNOSIS — Z7951 Long term (current) use of inhaled steroids: Secondary | ICD-10-CM | POA: Diagnosis not present

## 2016-05-18 DIAGNOSIS — Z7982 Long term (current) use of aspirin: Secondary | ICD-10-CM | POA: Diagnosis not present

## 2016-05-18 DIAGNOSIS — I10 Essential (primary) hypertension: Secondary | ICD-10-CM | POA: Diagnosis not present

## 2016-05-18 DIAGNOSIS — J449 Chronic obstructive pulmonary disease, unspecified: Secondary | ICD-10-CM | POA: Diagnosis not present

## 2016-05-18 DIAGNOSIS — Z23 Encounter for immunization: Secondary | ICD-10-CM | POA: Diagnosis not present

## 2016-05-18 DIAGNOSIS — Z79891 Long term (current) use of opiate analgesic: Secondary | ICD-10-CM | POA: Diagnosis not present

## 2016-05-18 DIAGNOSIS — Z7984 Long term (current) use of oral hypoglycemic drugs: Secondary | ICD-10-CM | POA: Diagnosis not present

## 2016-05-18 DIAGNOSIS — I4891 Unspecified atrial fibrillation: Secondary | ICD-10-CM | POA: Diagnosis not present

## 2016-05-18 DIAGNOSIS — J439 Emphysema, unspecified: Secondary | ICD-10-CM | POA: Diagnosis not present

## 2016-05-18 DIAGNOSIS — K5900 Constipation, unspecified: Secondary | ICD-10-CM | POA: Diagnosis present

## 2016-05-18 DIAGNOSIS — R079 Chest pain, unspecified: Secondary | ICD-10-CM

## 2016-05-18 DIAGNOSIS — M159 Polyosteoarthritis, unspecified: Secondary | ICD-10-CM | POA: Diagnosis present

## 2016-05-18 DIAGNOSIS — R7989 Other specified abnormal findings of blood chemistry: Secondary | ICD-10-CM

## 2016-05-18 DIAGNOSIS — F17210 Nicotine dependence, cigarettes, uncomplicated: Secondary | ICD-10-CM | POA: Diagnosis not present

## 2016-05-18 LAB — ECHOCARDIOGRAM COMPLETE
AO mean calculated velocity dopler: 142 cm/s
AV Area VTI index: 1.4 cm2/m2
AV Area VTI: 2.46 cm2
AV Area mean vel: 2.15 cm2
AV Mean grad: 9 mmHg
AV Peak grad: 15 mmHg
AV VEL mean LVOT/AV: 0.68
AV area mean vel ind: 1.14 cm2/m2
AV peak Index: 1.31
AV pk vel: 194 cm/s
AV vel: 2.64
Ao pk vel: 0.78 m/s
E decel time: 303 ms
E/e' ratio: 17.29
FS: 46 % — AB (ref 28–44)
Height: 70 in
IVS/LV PW RATIO, ED: 1.28
LA ID, A-P, ES: 40 mm
LA diam end sys: 40 mm
LA diam index: 2.12 cm/m2
LA vol A4C: 63.7 mL
LA vol index: 30.3 mL/m2
LA vol: 57 mL
LV E/e' medial: 17.29
LV E/e'average: 17.29
LV PW d: 12.9 mm — AB (ref 0.6–1.1)
LV dias vol index: 35 mL/m2
LV dias vol: 66 mL (ref 62–150)
LV e' LATERAL: 6.42 cm/s
LV sys vol index: 12 mL/m2
LV sys vol: 22 mL (ref 21–61)
LVOT SV: 116 mL
LVOT VTI: 36.8 cm
LVOT area: 3.14 cm2
LVOT diameter: 20 mm
LVOT peak VTI: 0.84 cm
LVOT peak grad rest: 9 mmHg
LVOT peak vel: 152 cm/s
Lateral S' vel: 18.8 cm/s
MV Dec: 303
MV Peak grad: 5 mmHg
MV pk A vel: 136 m/s
MV pk E vel: 111 m/s
RV sys press: 32 mmHg
Reg peak vel: 268 cm/s
Simpson's disk: 66
Stroke v: 43 mL
TAPSE: 27.7 mm
TDI e' lateral: 6.42
TDI e' medial: 5.77
TR max vel: 268 cm/s
VTI: 43.8 cm
Valve area index: 1.4
Valve area: 2.64 cm2
Weight: 2532.64 [oz_av]

## 2016-05-18 LAB — BASIC METABOLIC PANEL WITH GFR
Anion gap: 7 (ref 5–15)
BUN: 21 mg/dL — ABNORMAL HIGH (ref 6–20)
CO2: 24 mmol/L (ref 22–32)
Calcium: 8.8 mg/dL — ABNORMAL LOW (ref 8.9–10.3)
Chloride: 106 mmol/L (ref 101–111)
Creatinine, Ser: 0.87 mg/dL (ref 0.61–1.24)
GFR calc Af Amer: >60 mL/min
GFR calc non Af Amer: >60 mL/min
Glucose, Bld: 106 mg/dL — ABNORMAL HIGH (ref 65–99)
Potassium: 3.7 mmol/L (ref 3.5–5.1)
Sodium: 137 mmol/L (ref 135–145)

## 2016-05-18 LAB — LIPID PANEL
Cholesterol: 118 mg/dL (ref 0–200)
HDL: 31 mg/dL — ABNORMAL LOW (ref >40)
LDL Cholesterol: 58 mg/dL (ref 0–99)
Total CHOL/HDL Ratio: 3.8 RATIO
Triglycerides: 143 mg/dL (ref <150)
VLDL: 29 mg/dL (ref 0–40)

## 2016-05-18 LAB — GLUCOSE, CAPILLARY
Glucose-Capillary: 91 mg/dL (ref 65–99)
Glucose-Capillary: 114 mg/dL — ABNORMAL HIGH (ref 65–99)
Glucose-Capillary: 158 mg/dL — ABNORMAL HIGH (ref 65–99)
Glucose-Capillary: 103 mg/dL — ABNORMAL HIGH (ref 65–99)

## 2016-05-18 LAB — TROPONIN I
Troponin I: 0.24 ng/mL
Troponin I: 0.27 ng/mL
Troponin I: 0.22 ng/mL

## 2016-05-18 LAB — CBC
HCT: 41.6 % (ref 39.0–52.0)
Hemoglobin: 13.8 g/dL (ref 13.0–17.0)
MCH: 31.8 pg (ref 26.0–34.0)
MCHC: 33.2 g/dL (ref 30.0–36.0)
MCV: 95.9 fL (ref 78.0–100.0)
Platelets: 133 10*3/uL — ABNORMAL LOW (ref 150–400)
RBC: 4.34 MIL/uL (ref 4.22–5.81)
RDW: 13.5 % (ref 11.5–15.5)
WBC: 10.3 10*3/uL (ref 4.0–10.5)

## 2016-05-18 LAB — MAGNESIUM: Magnesium: 1.4 mg/dL — ABNORMAL LOW (ref 1.7–2.4)

## 2016-05-18 LAB — HEPARIN LEVEL (UNFRACTIONATED)
Heparin Unfractionated: 0.34 IU/mL (ref 0.30–0.70)
Heparin Unfractionated: 0.42 IU/mL (ref 0.30–0.70)

## 2016-05-18 LAB — T4, FREE: Free T4: 0.93 ng/dL (ref 0.61–1.12)

## 2016-05-18 LAB — BRAIN NATRIURETIC PEPTIDE: B Natriuretic Peptide: 96 pg/mL (ref 0.0–100.0)

## 2016-05-18 LAB — MRSA PCR SCREENING: MRSA by PCR: NEGATIVE

## 2016-05-18 MED ORDER — CARVEDILOL 3.125 MG PO TABS
3.1250 mg | ORAL_TABLET | Freq: Two times a day (BID) | ORAL | Status: DC
  Administered 2016-05-18 – 2016-05-19 (×3): 3.125 mg via ORAL
  Filled 2016-05-18 (×3): qty 1

## 2016-05-18 MED ORDER — TRAZODONE HCL 50 MG PO TABS
100.0000 mg | ORAL_TABLET | Freq: Every day | ORAL | Status: DC
  Administered 2016-05-18: 100 mg via ORAL
  Filled 2016-05-18: qty 2

## 2016-05-18 MED ORDER — MAGNESIUM SULFATE 2 GM/50ML IV SOLN
2.0000 g | INTRAVENOUS | Status: AC
  Administered 2016-05-18 (×2): 2 g via INTRAVENOUS
  Filled 2016-05-18 (×2): qty 50

## 2016-05-18 MED ORDER — MILK AND MOLASSES ENEMA: 1.0000 | Freq: Once | RECTAL | Status: DC

## 2016-05-18 MED ORDER — MAGNESIUM SULFATE 4 GM/100ML IV SOLN: 4.0000 g | Freq: Once | INTRAVENOUS | Status: DC

## 2016-05-18 MED ORDER — APIXABAN 5 MG PO TABS
5.0000 mg | ORAL_TABLET | Freq: Two times a day (BID) | ORAL | Status: DC
  Administered 2016-05-18 – 2016-05-19 (×2): 5 mg via ORAL
  Filled 2016-05-18 (×3): qty 1

## 2016-05-18 MED ORDER — MAGNESIUM CITRATE PO SOLN
1.0000 | Freq: Once | ORAL | Status: AC
  Administered 2016-05-18: 1 via ORAL
  Filled 2016-05-18: qty 296

## 2016-05-18 NOTE — Progress Notes (Signed)
**Note De-Identified Tong Pieczynski Obfuscation** STAT EKG completed reported to RN and placed in patient chart.

## 2016-05-18 NOTE — Progress Notes (Addendum)
PROGRESS NOTE    Nicolas Berry  [id number] DOB: 27-Jul-1946 DOA: 05/17/2016 PCP: Nicolas Lair, MD    Brief Narrative:  58 yom with a past hx of DM, HTN, COPD, HLD, and afib presented with complaints of generalized weakness. While being evaluated he was noted to have an elevated WBC of 13.4 and an elevated troponin of 0.23. He was also noted to be hypotensive.  EKG revealed a new onset of atrial fibrillation with a CHADSVASC score of 3. Pt was admitted for further evaluation atrial fibrillation.    Assessment & Plan:   Active Problems:   Diabetes mellitus ([place])   Essential hypertension, benign   BPH (benign prostatic hyperplasia)   COPD (chronic obstructive pulmonary disease) ([facility])   Atrial fibrillation ([place])   1. Afib with RVR. His EKG revealed a new onset of atrial fibrillation with rapid ventricular response. He has a CHADSVASC score of 3 and he was started on heparin and Cardizem infusion. Since that time, he has converted back to sinus rhythm. Check echo. TSH is in normal range. Consult cardiology. Will likely be switched to eliquis prior to discharge. Started on low dose coreg to help maintain sinus rhythm 2. Elevated troponin. Troponin is 0.27, trending up. Will cycle troponin. Will check echo. Likely demand ischemia in the setting of tachycardia 3. DM. Continue to hold metformin. Continue SSI.  4. HTN. Continue to hold lisinopril. Blood pressures are soft 5. HLD. Continue statins.   6. COPD. Stable. Pt is a smoker, he smokes two packs a day. Continue outpatient regimen.  7. Tobacco abuse. Counseled on the importance of cessation.    DVT prophylaxis: Heparin infusion  Code Status: Full Family Communication: no  family bedside Disposition Plan: Discharge home once improved    Consultants:   Cardiology  Procedures:   None   Antimicrobials:  None    Subjective: Overall feeling better. Complains of constipation. No chest pain. Shortness of breath  improving.  Objective: Vitals:   05/18/16 0330 05/18/16 0345 05/18/16 0400 05/18/16 0500  BP: 98/60 (!) 87/63 114/60   Pulse: 61 (!) 59 67   Resp: 16 16 15    Temp:   (!) 96.8 F (36 C)   TempSrc:   Oral   SpO2: 96% 95% 94%   Weight:    71.8 kg (158 lb 4.6 oz)  Height:        Intake/Output Summary (Last 24 hours) at 05/18/16 0602 Last data filed at 05/18/16 0500  Gross per 24 hour  Intake              840 ml  Output              650 ml  Net              190 ml   Filed Weights   05/17/16 1633 05/17/16 2220 05/18/16 0500  Weight: 68 kg (150 lb) 71.8 kg (158 lb 4.6 oz) 71.8 kg (158 lb 4.6 oz)    Examination:  General exam: Appears calm and comfortable  Respiratory system: Clear to auscultation. Respiratory effort normal. Cardiovascular system: S1 & S2 heard, RRR. No JVD, murmurs, rubs, gallops or clicks. No pedal edema. Gastrointestinal system: Abdomen is nondistended, soft and nontender. No organomegaly or masses felt. Normal bowel sounds heard. Central nervous system: Alert and oriented. No focal neurological deficits. Extremities: Symmetric 5 x 5 power. Skin: No rashes, lesions or ulcers Psychiatry: Judgement and insight appear normal. Mood & affect appropriate.     Data  Reviewed: I have personally reviewed following labs and imaging studies  CBC:  Recent Labs Lab 05/17/16 1646  WBC 13.4*  HGB 15.5  HCT 46.3  MCV 95.7  PLT [id number]   Basic Metabolic Panel:  Recent Labs Lab 05/17/16 1646 05/17/16 2332 05/18/16 0442  NA 137  --  137  K 4.0  --  3.7  CL 107  --  106  CO2 23  --  24  GLUCOSE 106*  --  106*  BUN 25*  --  21*  CREATININE 1.24  --  0.87  CALCIUM 9.4  --  8.8*  MG 1.5* 1.4*  --    GFR: Estimated Creatinine Clearance: 81.4 mL/min (by C-G formula based on SCr of 0.87 mg/dL). Liver Function Tests: No results for input(s): AST, ALT, ALKPHOS, BILITOT, PROT, ALBUMIN in the last 168 hours. No results for input(s): LIPASE, AMYLASE in the last 168  hours. No results for input(s): AMMONIA in the last 168 hours. Coagulation Profile:  Recent Labs Lab 05/17/16 1646  INR 1.05   Cardiac Enzymes:  Recent Labs Lab 05/17/16 1646 05/17/16 2332  TROPONINI 0.03* 0.24*   BNP (last 3 results) No results for input(s): PROBNP in the last 8760 hours. HbA1C: No results for input(s): HGBA1C in the last 72 hours. CBG: No results for input(s): GLUCAP in the last 168 hours. Lipid Profile: No results for input(s): CHOL, HDL, LDLCALC, TRIG, CHOLHDL, LDLDIRECT in the last 72 hours. Thyroid Function Tests:  Recent Labs  05/17/16 1714  TSH 2.164   Anemia Panel: No results for input(s): VITAMINB12, FOLATE, FERRITIN, TIBC, IRON, RETICCTPCT in the last 72 hours. Sepsis Labs: No results for input(s): PROCALCITON, LATICACIDVEN in the last 168 hours.  No results found for this or any previous visit (from the past 240 hour(s)).       Radiology Studies: Dg Chest Port 1 View  Result Date: 05/17/2016 CLINICAL DATA:  Worsening shortness of breath today with tachycardia. Smoker. EXAM: PORTABLE CHEST 1 VIEW COMPARISON:  Limited correlation made with abdominal CT 08/05/2012. FINDINGS: 1713 hours. Lordotic positioning. The heart size and mediastinal contours are normal. The lungs are clear. There is no pleural effusion or pneumothorax. No acute osseous findings are identified. Telemetry leads overlie the chest. IMPRESSION: No active cardiopulmonary process. Electronically Signed   By: Nicolas Berry M.D.   On: 05/17/2016 17:29        Scheduled Meds: . aspirin EC  81 mg Oral Daily  . atorvastatin  40 mg Oral q1800  . finasteride  5 mg Oral Daily  . insulin aspart  0-9 Units Subcutaneous TID WC  . mometasone-formoterol  2 puff Inhalation BID  . pneumococcal 23 valent vaccine  0.5 mL Intramuscular Tomorrow-1000   Continuous Infusions: . sodium chloride 10 mL/hr (05/17/16 2215)  . diltiazem (CARDIZEM) infusion Stopped (05/18/16 0200)  .  heparin 1,000 Units/hr (05/18/16 0400)     LOS: 0 days    Time spent: 25 minutes     Nicolas Dike, MD [facility] If 7PM-7AM, please contact night-coverage [email or web address] Password [facility] 05/18/2016, 6:02 AM   Addendum 18:00:  Discussed with Dr. Harrington Berry, since patient has elevated troponin, he will undergo stress test in AM. Continue on low dose beta blocker for. Transition heparin infusion to oral agent. If stress test negative, anticipate discharge home tomorrow. If stress test is positive, he will need further work up. Transfer to telemetry.  Nicolas Berry

## 2016-05-18 NOTE — Progress Notes (Signed)
ANTICOAGULATION CONSULT NOTE - follow up  Pharmacy Consult for heparin Indication: atrial fibrillation  No Known Allergies  Patient Measurements: Height: 5\' 10"  (177.8 cm) Weight: 158 lb 4.6 oz (71.8 kg) IBW/kg (Calculated) : 73 HEPARIN DW (KG): 71.8  Vital Signs: Temp: 97 F (36.1 C) (08/28 1221) Temp Source: Oral (08/28 1221) BP: 90/62 (08/28 0600) Pulse Rate: 57 (08/28 0600)  Labs:  Recent Labs  05/17/16 1646 05/17/16 2332 05/18/16 0442 05/18/16 1035  HGB 15.5  --  13.8  --   HCT 46.3  --  41.6  --   PLT 156  --  133*  --   APTT 34  --   --   --   LABPROT 13.7  --   --   --   INR 1.05  --   --   --   HEPARINUNFRC  --   --  0.34 0.42  CREATININE 1.24  --  0.87  --   TROPONINI 0.03* 0.24* 0.27* 0.22*   Estimated Creatinine Clearance: 81.4 mL/min (by C-G formula based on SCr of 0.87 mg/dL).  Medical History: Past Medical History:  Diagnosis Date  . Anxiety    OA multiple joints  . Arthritis   . BPH (benign prostatic hyperplasia)   . COPD (chronic obstructive pulmonary disease) ([place])   . DDD (degenerative disc disease), lumbar   . Diabetes mellitus without complication ([place])   . Fatty liver   . GERD (gastroesophageal reflux disease)   . Hyperlipidemia   . Hypertension    Medications:  See med rec  Assessment: 70 yo male presented with dizziness and not feeling well over last few weeks. His heart was racing and he had swelling in his right leg.  Has some SOB. Found to be in new onset Afib. Chadsvasc score of 3. Heparin was initiated.  HL therapeutic x 2 consecutive checks.    Goal of Therapy:  Heparin level 0.3-0.7 units/ml Monitor platelets by anticoagulation protocol: Yes   Plan:  Continue Heparin at 1000 units/hr HL and CBC daily while on Heparin  Hart Robinsons, PharmD Clinical Pharmacist Pager:  [phone] 05/18/2016

## 2016-05-18 NOTE — Progress Notes (Signed)
ANTICOAGULATION CONSULT NOTE - follow up  Pharmacy Consult for heparin Indication: atrial fibrillation  No Known Allergies  Patient Measurements: Height: 5\' 10"  (177.8 cm) Weight: 158 lb 4.6 oz (71.8 kg) IBW/kg (Calculated) : 73 HEPARIN DW (KG): 71.8  Vital Signs: Temp: 96.8 F (36 C) (08/28 0400) Temp Source: Oral (08/28 0400) BP: 90/62 (08/28 0600) Pulse Rate: 57 (08/28 0600)  Labs:  Recent Labs  05/17/16 1646 05/17/16 2332 05/18/16 0442  HGB 15.5  --   --   HCT 46.3  --   --   PLT 156  --   --   APTT 34  --   --   LABPROT 13.7  --   --   INR 1.05  --   --   HEPARINUNFRC  --   --  0.34  CREATININE 1.24  --  0.87  TROPONINI 0.03* 0.24* 0.27*    Estimated Creatinine Clearance: 81.4 mL/min (by C-G formula based on SCr of 0.87 mg/dL).   Medical History: Past Medical History:  Diagnosis Date  . Anxiety    OA multiple joints  . Arthritis   . BPH (benign prostatic hyperplasia)   . COPD (chronic obstructive pulmonary disease) ([place])   . DDD (degenerative disc disease), lumbar   . Diabetes mellitus without complication ([place])   . Fatty liver   . GERD (gastroesophageal reflux disease)   . Hyperlipidemia   . Hypertension    Medications:  See med rec  Assessment: 70 yo male presented with dizziness and not feeling well over last few weeks. His heart was racing and he had swelling in his right leg.  Has some SOB. Found to be in new onset Afib. Chadsvasc score of 3. Heparin was initiated.  HL this morning is therapeutic.  Goal of Therapy:  Heparin level 0.3-0.7 units/ml Monitor platelets by anticoagulation protocol: Yes   Plan:  Continue Heparin at 1000 units/hr F/U confirmatory level in 6 hrs HL and CBC daily while on Heparin  Hart Robinsons, PharmD Clinical Pharmacist Pager:  [phone] 05/18/2016

## 2016-05-18 NOTE — Care Management Obs Status (Signed)
[facility] NOTIFICATION   Patient Details  Name: Nicolas Berry MRN: [id number] Date of Birth: [birth year]   Medicare Observation Status Notification Given:  Yes    Sherald Barge, RN 05/18/2016, 2:59 PM

## 2016-05-18 NOTE — Consult Note (Signed)
Reason for Consult:   New AF with RVR  Requesting Physician: [facility] Primary Cardiologist New -Dr Harrington Challenger  HPI:   70 y/o [place] followed at the [place]. He has a history of HTN, DM, dyslipidemia, and heavy smoking (2 ppd). He has no prior history of CAD or MI. He presented to the ED at [facility] 05/18/16 with generalized weakness. The pt reports he had been been golfing and returned home and began to feel poorly. He says he "just didn't feel well". He denies any chest pain but his wife says he has complained to her of chest pain off and on in the last few months.  Initially the pt attributed his weakness to an interaction between Glucophage and Zocor. He had actually already contacted the [place] to been seen about this but then last evening his symptoms progressed and he asked his wife to take him to the ED after he checked his B/P at home and found it to be low. He denies any awareness of tachycardia.  In the ED he was noted to be hypotensive and in AF with RVR. He also had an elevated Troponin- 0.03-on adm, pk of 0.27.  His EKG showed AF with RVR. He has since converted to NSR on Diltiazem IV, though he is off this now and on Coreg (new for him).     PMHx:  Past Medical History:  Diagnosis Date  . Anxiety    OA multiple joints  . Arthritis   . BPH (benign prostatic hyperplasia)   . COPD (chronic obstructive pulmonary disease) ([place])   . DDD (degenerative disc disease), lumbar   . Diabetes mellitus without complication ([place])   . Fatty liver   . GERD (gastroesophageal reflux disease)   . Hyperlipidemia   . Hypertension     Past Surgical History:  Procedure Laterality Date  . BACK SURGERY    . HERNIA REPAIR      SOCHx:  reports that he has been smoking Cigarettes.  He has been smoking about 2.00 packs per day. He has never used smokeless tobacco. He reports that he uses drugs, including Marijuana. He reports that he does not drink alcohol.  FAMHx: Family History   Problem Relation Age of Onset  . Cancer Mother     bladder  . Diabetes Father   . Cancer Sister     breast     ALLERGIES: No Known Allergies  ROS: Review of Systems: General: negative for chills, fever, night sweats or weight changes.  Cardiovascular: negative for  dyspnea on exertion, edema, orthopnea, palpitations, paroxysmal nocturnal dyspnea or shortness of breath HEENT: negative for any visual disturbances, blindness, glaucoma Dermatological: negative for rash Respiratory: negative for cough, hemoptysis, or wheezing Urologic: negative for hematuria or dysuria Abdominal: negative for nausea, vomiting, diarrhea, bright red blood per rectum, melena, or hematemesis Neurologic: negative for visual changes, syncope, or dizziness Musculoskeletal: negative for back pain, joint pain, or swelling Psych: cooperative and appropriate All other systems reviewed and are otherwise negative except as noted above.   HOME MEDICATIONS: Prior to Admission medications   Medication Sig Start Date End Date Taking? Authorizing Provider  acarbose (PRECOSE) 50 MG tablet Take 25 mg by mouth daily.   Yes Historical Provider, MD  albuterol (PROVENTIL HFA;VENTOLIN HFA) 108 (90 BASE) MCG/ACT inhaler Inhale 2 puffs into the lungs every 4 (four) hours as needed.   Yes Historical Provider, MD  aspirin EC 81 MG tablet Take 81  mg by mouth daily.   Yes Historical Provider, MD  budesonide-formoterol (SYMBICORT) 160-4.5 MCG/ACT inhaler Inhale 2 puffs into the lungs 2 (two) times daily. dOSE UNKNOWN   Yes Historical Provider, MD  clonazePAM (KLONOPIN) 0.5 MG tablet Take 0.5 mg by mouth 2 (two) times daily as needed.   Yes Historical Provider, MD  finasteride (PROSCAR) 5 MG tablet Take 5 mg by mouth daily.   Yes Historical Provider, MD  HYDROcodone-acetaminophen (NORCO/VICODIN) 5-325 MG per tablet Take 1 tablet by mouth every 6 (six) hours as needed.    Yes Historical Provider, MD  lisinopril (PRINIVIL,ZESTRIL) 40  MG tablet Take 40 mg by mouth daily.   Yes Historical Provider, MD  meloxicam (MOBIC) 15 MG tablet Take 7.5 mg by mouth daily.   Yes Historical Provider, MD  metFORMIN (GLUCOPHAGE) 1000 MG tablet Take 1,000 mg by mouth 2 (two) times daily.   Yes Historical Provider, MD  simvastatin (ZOCOR) 80 MG tablet Take 40 mg by mouth at bedtime.   Yes Historical Provider, MD  traZODone (DESYREL) 100 MG tablet Take 100 mg by mouth at bedtime.   Yes Historical Provider, MD  Vitamin D, Ergocalciferol, (DRISDOL) 50000 UNITS CAPS Take 50,000 Units by mouth every 30 (thirty) days.   Yes Historical Provider, MD    HOSPITAL MEDICATIONS: I have reviewed the patient's current medications.  VITALS: Blood pressure (!) 146/83, pulse 74, temperature 97 F (36.1 C), temperature source Oral, resp. rate 15, height 5\' 10"  (1.778 m), weight 158 lb 4.6 oz (71.8 kg), SpO2 98 %.  PHYSICAL EXAM: General appearance: alert, cooperative and no distress Neck: no JVD and LCA bruit Lungs: clear to auscultation bilaterally Heart: regular rate and rhythm Abdomen: soft, non-tender; bowel sounds normal; no masses,  no organomegaly Extremities: extremities normal, atraumatic, no cyanosis or edema Pulses: 2+ and symmetric Skin: Skin color, texture, turgor normal. No rashes or lesions Neurologic: Grossly normal  LABS: Results for orders placed or performed during the hospital encounter of 05/17/16 (from the past 24 hour(s))  Basic metabolic panel     Status: Abnormal   Collection Time: 05/17/16  4:46 PM  Result Value Ref Range   Sodium 137 135 - 145 mmol/L   Potassium 4.0 3.5 - 5.1 mmol/L   Chloride 107 101 - 111 mmol/L   CO2 23 22 - 32 mmol/L   Glucose, Bld 106 (H) 65 - 99 mg/dL   BUN 25 (H) 6 - 20 mg/dL   Creatinine, Ser 1.24 0.61 - 1.24 mg/dL   Calcium 9.4 8.9 - 10.3 mg/dL   GFR calc non Af Amer 58 (L) >60 mL/min   GFR calc Af Amer >60 >60 mL/min   Anion gap 7 5 - 15  CBC     Status: Abnormal   Collection Time:  05/17/16  4:46 PM  Result Value Ref Range   WBC 13.4 (H) 4.0 - 10.5 K/uL   RBC 4.84 4.22 - 5.81 MIL/uL   Hemoglobin 15.5 13.0 - 17.0 g/dL   HCT 46.3 39.0 - 52.0 %   MCV 95.7 78.0 - 100.0 fL   MCH 32.0 26.0 - 34.0 pg   MCHC 33.5 30.0 - 36.0 g/dL   RDW 13.4 11.5 - 15.5 %   Platelets 156 150 - 400 K/uL  D-dimer, quantitative (not at [facility])     Status: None   Collection Time: 05/17/16  4:46 PM  Result Value Ref Range   D-Dimer, Quant 0.44 0.00 - 0.50 ug/mL-FEU  APTT  (IF patient is  taking Pradaxa)     Status: None   Collection Time: 05/17/16  4:46 PM  Result Value Ref Range   aPTT 34 24 - 36 seconds  Protime-INR (if pt is taking coumadin)     Status: None   Collection Time: 05/17/16  4:46 PM  Result Value Ref Range   Prothrombin Time 13.7 11.4 - 15.2 seconds   INR 1.05   Troponin I     Status: Abnormal   Collection Time: 05/17/16  4:46 PM  Result Value Ref Range   Troponin I 0.03 (HH) <0.03 ng/mL  Magnesium     Status: Abnormal   Collection Time: 05/17/16  4:46 PM  Result Value Ref Range   Magnesium 1.5 (L) 1.7 - 2.4 mg/dL  TSH     Status: None   Collection Time: 05/17/16  5:14 PM  Result Value Ref Range   TSH 2.164 0.350 - 4.500 uIU/mL  Urinalysis, Routine w reflex microscopic     Status: Abnormal   Collection Time: 05/17/16  8:00 PM  Result Value Ref Range   Color, Urine YELLOW YELLOW   APPearance CLEAR CLEAR   Specific Gravity, Urine 1.020 1.005 - 1.030   pH 5.5 5.0 - 8.0   Glucose, UA NEGATIVE NEGATIVE mg/dL   Hgb urine dipstick NEGATIVE NEGATIVE   Bilirubin Urine NEGATIVE NEGATIVE   Ketones, ur TRACE (A) NEGATIVE mg/dL   Protein, ur NEGATIVE NEGATIVE mg/dL   Nitrite NEGATIVE NEGATIVE   Leukocytes, UA NEGATIVE NEGATIVE  T4, free     Status: None   Collection Time: 05/17/16 11:32 PM  Result Value Ref Range   Free T4 0.93 0.61 - 1.12 ng/dL  Magnesium     Status: Abnormal   Collection Time: 05/17/16 11:32 PM  Result Value Ref Range   Magnesium 1.4 (L) 1.7 - 2.4  mg/dL  Troponin I     Status: Abnormal   Collection Time: 05/17/16 11:32 PM  Result Value Ref Range   Troponin I 0.24 (HH) <0.03 ng/mL  Brain natriuretic peptide     Status: None   Collection Time: 05/17/16 11:32 PM  Result Value Ref Range   B Natriuretic Peptide 96.0 0.0 - 100.0 pg/mL  MRSA PCR Screening     Status: None   Collection Time: 05/18/16 12:30 AM  Result Value Ref Range   MRSA by PCR NEGATIVE NEGATIVE  CBC     Status: Abnormal   Collection Time: 05/18/16  4:42 AM  Result Value Ref Range   WBC 10.3 4.0 - 10.5 K/uL   RBC 4.34 4.22 - 5.81 MIL/uL   Hemoglobin 13.8 13.0 - 17.0 g/dL   HCT 41.6 39.0 - 52.0 %   MCV 95.9 78.0 - 100.0 fL   MCH 31.8 26.0 - 34.0 pg   MCHC 33.2 30.0 - 36.0 g/dL   RDW 13.5 11.5 - 15.5 %   Platelets 133 (L) 150 - 400 K/uL  Heparin level (unfractionated)     Status: None   Collection Time: 05/18/16  4:42 AM  Result Value Ref Range   Heparin Unfractionated 0.34 0.30 - 0.70 IU/mL  Troponin I     Status: Abnormal   Collection Time: 05/18/16  4:42 AM  Result Value Ref Range   Troponin I 0.27 (HH) <0.03 ng/mL  Basic metabolic panel     Status: Abnormal   Collection Time: 05/18/16  4:42 AM  Result Value Ref Range   Sodium 137 135 - 145 mmol/L   Potassium 3.7 3.5 - 5.1  mmol/L   Chloride 106 101 - 111 mmol/L   CO2 24 22 - 32 mmol/L   Glucose, Bld 106 (H) 65 - 99 mg/dL   BUN 21 (H) 6 - 20 mg/dL   Creatinine, Ser 0.87 0.61 - 1.24 mg/dL   Calcium 8.8 (L) 8.9 - 10.3 mg/dL   GFR calc non Af Amer >60 >60 mL/min   GFR calc Af Amer >60 >60 mL/min   Anion gap 7 5 - 15  Lipid panel     Status: Abnormal   Collection Time: 05/18/16  4:42 AM  Result Value Ref Range   Cholesterol 118 0 - 200 mg/dL   Triglycerides 143 <150 mg/dL   HDL 31 (L) >40 mg/dL   Total CHOL/HDL Ratio 3.8 RATIO   VLDL 29 0 - 40 mg/dL   LDL Cholesterol 58 0 - 99 mg/dL  Glucose, capillary     Status: None   Collection Time: 05/18/16  7:30 AM  Result Value Ref Range    Glucose-Capillary 91 65 - 99 mg/dL   Comment 1 Notify RN    Comment 2 Document in Chart   Troponin I     Status: Abnormal   Collection Time: 05/18/16 10:35 AM  Result Value Ref Range   Troponin I 0.22 (HH) <0.03 ng/mL  Heparin level (unfractionated)     Status: None   Collection Time: 05/18/16 10:35 AM  Result Value Ref Range   Heparin Unfractionated 0.42 0.30 - 0.70 IU/mL  Glucose, capillary     Status: Abnormal   Collection Time: 05/18/16 11:50 AM  Result Value Ref Range   Glucose-Capillary 114 (H) 65 - 99 mg/dL   Comment 1 Notify RN    Comment 2 Document in Chart     EKG: pending  IMAGING: Dg Chest Port 1 View  Result Date: 05/17/2016 CLINICAL DATA:  Worsening shortness of breath today with tachycardia. Smoker. EXAM: PORTABLE CHEST 1 VIEW COMPARISON:  Limited correlation made with abdominal CT 08/05/2012. FINDINGS: 1713 hours. Lordotic positioning. The heart size and mediastinal contours are normal. The lungs are clear. There is no pleural effusion or pneumothorax. No acute osseous findings are identified. Telemetry leads overlie the chest. IMPRESSION: No active cardiopulmonary process. Electronically Signed   By: Richardean Sale M.D.   On: 05/17/2016 17:29   Echo: 05/18/16 Study Conclusions  - Left ventricle: Turbulent flow through LVOT. Systolic anterior   motio of MV leaflets Peak gradient through LV/LVOT /AV is 15 mm   Hg at rest No signifiant obstruction at rest. The cavity size was   normal. Wall thickness was increased in a pattern of mild LVH.   Systolic function was vigorous. The estimated ejection fraction   was in the range of 65% to 70%. Doppler parameters are consistent   with abnormal left ventricular relaxation (grade 1 diastolic   dysfunction). - Aortic valve: AV is thickened, calcified with minimally   restricted motion. - Mitral valve: SAM of mitral valve leaflets. Calcified annulus.   Mildly thickened leaflets . - Left atrium: The atrium was mildly  dilated. - Pulmonary arteries: PA peak pressure: 32 mm Hg (S).  IMPRESSION: Active Problems:   Atrial fibrillation with RVR (HCC)   Diabetes mellitus (HCC)   Essential hypertension, benign   COPD (chronic obstructive pulmonary disease) (HCC)   Chest pain with moderate risk of acute coronary syndrome   Hyperlipidemia   BPH (benign prostatic hyperplasia)   Tobacco use   RECOMMENDATION: Check EKG in NSR. Will review with MD,  he needs further ischemic evaluation. The question is should this be done as an OP or in patient with his Troponin elelvation, (the pt wants to go home tonight). CHADs VASc is 3 for DM, HTN, and age, he will need anticoagulation.   Time Spent Directly with Patient: 5 minutes  Kerin Ransom, [place] beeper 05/18/2016, 4:21 PM   Pt seen and examined  I agree with findingas as noted above by L Kilroy Pt is a 70 yo with history of HTN, DM, tobacco use  Active  Has had intermitt chest discomfort in past  Yesterday felt bad  Came to ER  Hypotensive  HR high  IN Afib wit hRVR  Converted to SR  Trop mildly elevated at 0.27   ON exam Lungs CTA  Cardiac RRR Gr II/VI systolic murmur at base    Ext without edema Echo today Vigorous LV function  SAM with turbulent flow through LVOT  AV mildly thickened with miinimally restricted motion.  Peak gradient through LV/LVOT/AV is 15 mm Hg   I have reviewed results with pt  1.  Afib  Will need to follow  WOuld do well on b blocker as BP tolerates SHould be on anticoag as CHADSVASc is 3.  2.  Chest discomfort/mild trop elevaton  May be explained by afib with RVR, hypotension Options 1Cath vs 2 Lexiscan myovue  Discussed with pt pros/cons Will plan on lexiscan myovue (has knee problems ) tomorrow. If normal continue medial Rx   3 BP follow   4  Aortic valve  Echo with SAM, turbulent flow through LVOT and mildly restricted AV motion  Peak gradient 15 mm Hg  Avoid dehydration.  Keep HR controlled  Will need to be followed  over time.    4  Tob  Needs to quit

## 2016-05-18 NOTE — Progress Notes (Signed)
12 LEAD EKG OBTAINED D/T PT CONVERTING TO NSR. CARDIZEM IS NOW TURNED OFF.

## 2016-05-18 NOTE — Care Management Note (Signed)
Case Management Note  Patient Details  Name: Rennard Deruiter MRN: [id number] Date of Birth: 1946/02/04  Subjective/Objective:                  Pt is from home, lives with his wife and is ind with ADL's. He has a new Dx of a-fib and will be discharged on Eliquis. He was given 30-day free voucher for Rx, He will need two Rx, one to be sent to [place] to be filled by mail and one so pt can have immediate supply. Pt plans to return home with self care at DC. No HH or DME needs. Pt will need cardiology f/u and he requests [facility] office in [place] or [place].   Action/Plan: No further CM needs. Anticipate DC home later today.   Expected Discharge Date:    05/18/2016              Expected Discharge Plan:  Home/Self Care  In-House Referral:  NA  Discharge planning Services  CM Consult  Post Acute Care Choice:  NA Choice offered to:  NA  DME Arranged:    DME Agency:     HH Arranged:    HH Agency:     Status of Service:  Completed, signed off  If discussed at [employer] of Stay Meetings, dates discussed:    Additional Comments:  Sherald Barge, RN 05/18/2016, 3:06 PM

## 2016-05-18 NOTE — Progress Notes (Signed)
0134 Troponin 0.24, Dr.Lama notified.

## 2016-05-18 NOTE — Progress Notes (Signed)
*  PRELIMINARY RESULTS* Echocardiogram 2D Echocardiogram has been performed.  Nicolas Berry 05/18/2016, 3:22 PM

## 2016-05-19 ENCOUNTER — Inpatient Hospital Stay (HOSPITAL_COMMUNITY): Payer: Medicare Other

## 2016-05-19 ENCOUNTER — Encounter (HOSPITAL_COMMUNITY): Payer: Self-pay

## 2016-05-19 DIAGNOSIS — E785 Hyperlipidemia, unspecified: Secondary | ICD-10-CM

## 2016-05-19 DIAGNOSIS — I4891 Unspecified atrial fibrillation: Principal | ICD-10-CM

## 2016-05-19 DIAGNOSIS — R778 Other specified abnormalities of plasma proteins: Secondary | ICD-10-CM | POA: Diagnosis present

## 2016-05-19 DIAGNOSIS — E119 Type 2 diabetes mellitus without complications: Secondary | ICD-10-CM

## 2016-05-19 DIAGNOSIS — J439 Emphysema, unspecified: Secondary | ICD-10-CM

## 2016-05-19 DIAGNOSIS — Z720 Tobacco use: Secondary | ICD-10-CM

## 2016-05-19 DIAGNOSIS — I10 Essential (primary) hypertension: Secondary | ICD-10-CM

## 2016-05-19 DIAGNOSIS — R7989 Other specified abnormal findings of blood chemistry: Secondary | ICD-10-CM

## 2016-05-19 DIAGNOSIS — R079 Chest pain, unspecified: Secondary | ICD-10-CM

## 2016-05-19 DIAGNOSIS — Z7901 Long term (current) use of anticoagulants: Secondary | ICD-10-CM

## 2016-05-19 LAB — CBC
HCT: 44.5 % (ref 39.0–52.0)
Hemoglobin: 14.7 g/dL (ref 13.0–17.0)
MCH: 31.5 pg (ref 26.0–34.0)
MCHC: 33 g/dL (ref 30.0–36.0)
MCV: 95.5 fL (ref 78.0–100.0)
Platelets: 122 10*3/uL — ABNORMAL LOW (ref 150–400)
RBC: 4.66 MIL/uL (ref 4.22–5.81)
RDW: 13.5 % (ref 11.5–15.5)
WBC: 9.7 10*3/uL (ref 4.0–10.5)

## 2016-05-19 LAB — NM MYOCAR MULTI W/SPECT W/WALL MOTION / EF
LV dias vol: 67 mL (ref 62–150)
LV sys vol: 18 mL
Peak HR: 100 {beats}/min
RATE: 0.27
Rest HR: 60 {beats}/min
SDS: 0
SRS: 2
SSS: 2
TID: 0.99

## 2016-05-19 LAB — GLUCOSE, CAPILLARY
Glucose-Capillary: 126 mg/dL — ABNORMAL HIGH (ref 65–99)
Glucose-Capillary: 190 mg/dL — ABNORMAL HIGH (ref 65–99)
Glucose-Capillary: 84 mg/dL (ref 65–99)

## 2016-05-19 LAB — HEMOGLOBIN A1C
Hgb A1c MFr Bld: 5.8 % — ABNORMAL HIGH (ref 4.8–5.6)
Mean Plasma Glucose: 120 mg/dL

## 2016-05-19 MED ORDER — REGADENOSON 0.4 MG/5ML IV SOLN
INTRAVENOUS | Status: AC
  Administered 2016-05-19: 0.4 mg via INTRAVENOUS
  Filled 2016-05-19: qty 5

## 2016-05-19 MED ORDER — TECHNETIUM TC 99M TETROFOSMIN IV KIT
10.0000 | PACK | Freq: Once | INTRAVENOUS | Status: AC | PRN
  Administered 2016-05-19: 10 via INTRAVENOUS

## 2016-05-19 MED ORDER — APIXABAN 5 MG PO TABS: 5.0000 mg | ORAL_TABLET | Freq: Two times a day (BID) | ORAL | 1 refills | Status: DC

## 2016-05-19 MED ORDER — CARVEDILOL 3.125 MG PO TABS: 3.1250 mg | ORAL_TABLET | Freq: Two times a day (BID) | ORAL | 1 refills | Status: DC

## 2016-05-19 MED ORDER — SODIUM CHLORIDE 0.9% FLUSH
INTRAVENOUS | Status: AC
  Administered 2016-05-19: 10 mL via INTRAVENOUS
  Filled 2016-05-19: qty 10

## 2016-05-19 MED ORDER — TECHNETIUM TC 99M TETROFOSMIN IV KIT
30.0000 | PACK | Freq: Once | INTRAVENOUS | Status: AC | PRN
  Administered 2016-05-19: 30 via INTRAVENOUS

## 2016-05-19 MED ORDER — OFF THE BEAT BOOK
Freq: Once | Status: AC
  Administered 2016-05-19: 15:00:00
  Filled 2016-05-19: qty 1

## 2016-05-19 NOTE — Progress Notes (Signed)
PROGRESS NOTE    Nicolas Berry  [id number] DOB: 1946/07/17 DOA: 05/17/2016 PCP: Deloria Lair, MD    Brief Narrative:  39 yom with a past hx of DM, HTN, COPD, HLD, and afib presented with complaints of generalized weakness. He was found to have new onset A fib with RVR and a mildly elevated troponin. He was treated with cardizem and anticoagulation. He has since converted back to sinus rhythm and started on low dose coreg. Cardiology following and will perform stress test later today.  Assessment & Plan:   Active Problems:   Diabetes mellitus ([place])   Essential hypertension, benign   Hyperlipidemia   BPH (benign prostatic hyperplasia)   COPD (chronic obstructive pulmonary disease) (HCC)   Tobacco use   Atrial fibrillation with RVR (HCC)   Chest pain with moderate risk of acute coronary syndrome   Atrial fibrillation ([place]) 1. Afib with RVR. His EKG revealed a new onset of atrial fibrillation with rapid ventricular response. He has a CHADSVASC score of 3 and he was started on heparin and Cardizem infusion. Since that time, he has converted back to sinus rhythm. Echo revealed grade 1 diastolic dysfunction with an EF of 65-70%. TSH is in normal range. Cardiology following. Heparin infusion has been transitioned to eliquis. Continue on low dose coreg to help maintain sinus rhythm. Plans for stress test later today. If stress test is negative, anticipate discharge home later today 2. Elevated troponin. Troponin is 0.27. Echo as below. Plans for stress test later today 3. DM. Continue to hold metformin. Continue SSI.  4. HTN. Restart lisinopril on discharge. Blood pressures are stable.  5. HLD. Continue statins.   6. COPD. Stable. Pt is a smoker, he smokes two packs a day. Continue outpatient regimen.  7. Tobacco abuse. Counseled on the importance of cessation.    DVT prophylaxis: Eliquis Code Status: Full Family Communication: No family bedside Disposition Plan: Discharge home  once improved    Consultants:   Cardiology   Procedures:   Echo  - Left ventricle: Turbulent flow through LVOT. Systolic anterior   motio of MV leaflets Peak gradient through LV/LVOT /AV is 15 mm   Hg at rest No signifiant obstruction at rest. The cavity size was   normal. Wall thickness was increased in a pattern of mild LVH.   Systolic function was vigorous. The estimated ejection fraction   was in the range of 65% to 70%. Doppler parameters are consistent   with abnormal left ventricular relaxation (grade 1 diastolic   dysfunction). - Aortic valve: AV is thickened, calcified with minimally   restricted motion. - Mitral valve: SAM of mitral valve leaflets. Calcified annulus.   Mildly thickened leaflets . - Left atrium: The atrium was mildly dilated. - Pulmonary arteries: PA peak pressure: 32 mm Hg (S).   Antimicrobials:   None    Subjective: No chest pain or palpitations, feeling better, wants to go home.  Objective: Vitals:   05/19/16 0200 05/19/16 0300 05/19/16 0400 05/19/16 0500  BP: (!) 106/53 (!) 107/53 104/70 (!) 149/85  Pulse: 65 62 65 88  Resp:      Temp:   98.2 F (36.8 C)   TempSrc:   Oral   SpO2: 97% 97% 94% 96%  Weight:    69.3 kg (152 lb 12.5 oz)  Height:        Intake/Output Summary (Last 24 hours) at 05/19/16 0643 Last data filed at 05/18/16 1800  Gross per 24 hour  Intake  387.5 ml  Output              975 ml  Net           -587.5 ml   Filed Weights   05/17/16 2220 05/18/16 0500 05/19/16 0500  Weight: 71.8 kg (158 lb 4.6 oz) 71.8 kg (158 lb 4.6 oz) 69.3 kg (152 lb 12.5 oz)    Examination:  General exam: Appears calm and comfortable  Respiratory system: Clear to auscultation. Respiratory effort normal. Cardiovascular system: S1 & S2 heard, RRR. No JVD, murmurs, rubs, gallops or clicks. No pedal edema. Gastrointestinal system: Abdomen is nondistended, soft and nontender. No organomegaly or masses felt. Normal bowel sounds  heard. Central nervous system: Alert and oriented. No focal neurological deficits. Extremities: Symmetric 5 x 5 power. Skin: No rashes, lesions or ulcers Psychiatry: Judgement and insight appear normal. Mood & affect appropriate.     Data Reviewed: I have personally reviewed following labs and imaging studies  CBC:  Recent Labs Lab 05/17/16 1646 05/18/16 0442 05/19/16 0436  WBC 13.4* 10.3 9.7  HGB 15.5 13.8 14.7  HCT 46.3 41.6 44.5  MCV 95.7 95.9 95.5  PLT 156 133* [id number]*   Basic Metabolic Panel:  Recent Labs Lab 05/17/16 1646 05/17/16 2332 05/18/16 0442  NA 137  --  137  K 4.0  --  3.7  CL 107  --  106  CO2 23  --  24  GLUCOSE 106*  --  106*  BUN 25*  --  21*  CREATININE 1.24  --  0.87  CALCIUM 9.4  --  8.8*  MG 1.5* 1.4*  --    GFR: Estimated Creatinine Clearance: 78.5 mL/min (by C-G formula based on SCr of 0.87 mg/dL). Liver Function Tests: No results for input(s): AST, ALT, ALKPHOS, BILITOT, PROT, ALBUMIN in the last 168 hours. No results for input(s): LIPASE, AMYLASE in the last 168 hours. No results for input(s): AMMONIA in the last 168 hours. Coagulation Profile:  Recent Labs Lab 05/17/16 1646  INR 1.05   Cardiac Enzymes:  Recent Labs Lab 05/17/16 1646 05/17/16 2332 05/18/16 0442 05/18/16 1035  TROPONINI 0.03* 0.24* 0.27* 0.22*   BNP (last 3 results) No results for input(s): PROBNP in the last 8760 hours. HbA1C:  Recent Labs  05/17/16 2332  HGBA1C 5.8*   CBG:  Recent Labs Lab 05/18/16 0730 05/18/16 1150 05/18/16 1624 05/18/16 2117  GLUCAP 91 114* 158* 103*   Lipid Profile:  Recent Labs  05/18/16 0442  CHOL 118  HDL 31*  LDLCALC 58  TRIG 143  CHOLHDL 3.8   Thyroid Function Tests:  Recent Labs  05/17/16 1714 05/17/16 2332  TSH 2.164  --   FREET4  --  0.93   Anemia Panel: No results for input(s): VITAMINB12, FOLATE, FERRITIN, TIBC, IRON, RETICCTPCT in the last 72 hours. Sepsis Labs: No results for input(s):  PROCALCITON, LATICACIDVEN in the last 168 hours.  Recent Results (from the past 240 hour(s))  MRSA PCR Screening     Status: None   Collection Time: 05/18/16 12:30 AM  Result Value Ref Range Status   MRSA by PCR NEGATIVE NEGATIVE Final    Comment:        The GeneXpert MRSA Assay (FDA approved for NASAL specimens only), is one component of a comprehensive MRSA colonization surveillance program. It is not intended to diagnose MRSA infection nor to guide or monitor treatment for MRSA infections.          Radiology Studies: Dg  Chest Port 1 View  Result Date: 05/17/2016 CLINICAL DATA:  Worsening shortness of breath today with tachycardia. Smoker. EXAM: PORTABLE CHEST 1 VIEW COMPARISON:  Limited correlation made with abdominal CT 08/05/2012. FINDINGS: 1713 hours. Lordotic positioning. The heart size and mediastinal contours are normal. The lungs are clear. There is no pleural effusion or pneumothorax. No acute osseous findings are identified. Telemetry leads overlie the chest. IMPRESSION: No active cardiopulmonary process. Electronically Signed   By: Richardean Sale M.D.   On: 05/17/2016 17:29        Scheduled Meds: . apixaban  5 mg Oral BID  . aspirin EC  81 mg Oral Daily  . atorvastatin  40 mg Oral q1800  . carvedilol  3.125 mg Oral BID WC  . finasteride  5 mg Oral Daily  . insulin aspart  0-9 Units Subcutaneous TID WC  . mometasone-formoterol  2 puff Inhalation BID  . off the beat book   Does not apply Once  . traZODone  100 mg Oral QHS   Continuous Infusions: . sodium chloride 10 mL/hr at 05/18/16 1624  . diltiazem (CARDIZEM) infusion Stopped (05/18/16 0200)     LOS: 1 day    Time spent: 25 minutes     Kathie Dike, MD [facility] If 7PM-7AM, please contact night-coverage [email or web address] Password [facility] 05/19/2016, 6:43 AM

## 2016-05-19 NOTE — Discharge Summary (Signed)
Physician Discharge Summary  Nicolas Berry [id number] DOB: 12/23/1945 DOA: 05/17/2016  PCP: Deloria Lair, MD  Admit date: 05/17/2016 Discharge date: 05/19/2016  Admitted From: home Disposition: home  Recommendations for Outpatient Follow-up:  1. Follow up with PCP in 1-2 weeks   Home Health: Equipment/Devices:  Discharge Condition:stable CODE STATUS: full Diet recommendation: Heart Healthy  Brief/Interim Summary: 1. Afib with RVR. His EKG revealed a new onset of atrial fibrillation with rapid ventricular response. He has a CHADSVASC score of 3 and he was started on heparin and Cardizem infusion. Since that time, he has converted back to sinus rhythm. Echo revealed grade 1 diastolic dysfunction with an EF of 65-70%. TSH is in normal range. Cardiology following. Heparin infusion has been transitioned to eliquis. Continue on low dose coreg to help maintain sinus rhythm.  2. Elevated troponin. Troponin is 0.27. Echo as below. Stress test performed by cardiology was found to be a low risk study. 3. DM. Continue to hold metformin. Continue SSI.  4. HTN. Restart lisinopril on discharge. Blood pressures are stable.  5. HLD. Continue statins.  6. COPD. Stable. Pt is a smoker, he smokes two packs a day. Continue outpatient regimen.  7. Tobacco abuse. Counseled on the importance of cessation.   Discharge Diagnoses:  Active Problems:   Diabetes mellitus ([place])   Essential hypertension, benign   Hyperlipidemia   BPH (benign prostatic hyperplasia)   COPD (chronic obstructive pulmonary disease) (HCC)   Tobacco use   Atrial fibrillation with RVR (HCC)   Chest pain with moderate risk of acute coronary syndrome   Anticoagulated-Eliquis started   Troponin level elevated    Discharge Instructions  Discharge Instructions    Amb referral to [facility] Clinic    Complete by:  As directed   Diet - low sodium heart healthy    Complete by:  As directed   Increase activity slowly    Complete  by:  As directed       Medication List    STOP taking these medications   meloxicam 15 MG tablet Commonly known as:  MOBIC     TAKE these medications   acarbose 50 MG tablet Commonly known as:  PRECOSE Take 25 mg by mouth daily.   albuterol 108 (90 Base) MCG/ACT inhaler Commonly known as:  PROVENTIL HFA;VENTOLIN HFA Inhale 2 puffs into the lungs every 4 (four) hours as needed.   apixaban 5 MG Tabs tablet Commonly known as:  ELIQUIS Take 1 tablet (5 mg total) by mouth 2 (two) times daily.   aspirin EC 81 MG tablet Take 81 mg by mouth daily.   budesonide-formoterol 160-4.5 MCG/ACT inhaler Commonly known as:  SYMBICORT Inhale 2 puffs into the lungs 2 (two) times daily. dOSE UNKNOWN   carvedilol 3.125 MG tablet Commonly known as:  COREG Take 1 tablet (3.125 mg total) by mouth 2 (two) times daily with a meal.   clonazePAM 0.5 MG tablet Commonly known as:  KLONOPIN Take 0.5 mg by mouth 2 (two) times daily as needed.   finasteride 5 MG tablet Commonly known as:  PROSCAR Take 5 mg by mouth daily.   HYDROcodone-acetaminophen 5-325 MG tablet Commonly known as:  NORCO/VICODIN Take 1 tablet by mouth every 6 (six) hours as needed.   lisinopril 40 MG tablet Commonly known as:  PRINIVIL,ZESTRIL Take 40 mg by mouth daily.   metFORMIN 1000 MG tablet Commonly known as:  GLUCOPHAGE Take 1,000 mg by mouth 2 (two) times daily.   simvastatin 80 MG tablet Commonly  known as:  ZOCOR Take 40 mg by mouth at bedtime.   traZODone 100 MG tablet Commonly known as:  DESYREL Take 100 mg by mouth at bedtime.   Vitamin D (Ergocalciferol) 50000 units Caps capsule Commonly known as:  DRISDOL Take 50,000 Units by mouth every 30 (thirty) days.       No Known Allergies  Consultations:  cardiology   Procedures/Studies: Nm Myocar Multi W/spect W/wall Motion / Ef  Result Date: 05/19/2016  There was no ST segment deviation noted during stress.  No T wave inversion was noted  during stress.  The study is normal.  This is a low risk study.  The left ventricular ejection fraction is hyperdynamic (>65%).    Dg Chest Port 1 View  Result Date: 05/17/2016 CLINICAL DATA:  Worsening shortness of breath today with tachycardia. Smoker. EXAM: PORTABLE CHEST 1 VIEW COMPARISON:  Limited correlation made with abdominal CT 08/05/2012. FINDINGS: 1713 hours. Lordotic positioning. The heart size and mediastinal contours are normal. The lungs are clear. There is no pleural effusion or pneumothorax. No acute osseous findings are identified. Telemetry leads overlie the chest. IMPRESSION: No active cardiopulmonary process. Electronically Signed   By: Richardean Sale M.D.   On: 05/17/2016 17:29    (   Subjective: Feeling better, no palpitations or chest pain  Discharge Exam: Vitals:   05/19/16 1445 05/19/16 1500  BP:  (!) 153/96  Pulse:    Resp: 13 15  Temp:     Vitals:   05/19/16 0830 05/19/16 1435 05/19/16 1445 05/19/16 1500  BP:  (!) 145/92  (!) 153/96  Pulse:      Resp:  17 13 15   Temp:      TempSrc:      SpO2: 98%     Weight:      Height:        General: Pt is alert, awake, not in acute distress Cardiovascular: RRR, S1/S2 +, no rubs, no gallops Respiratory: CTA bilaterally, no wheezing, no rhonchi Abdominal: Soft, NT, ND, bowel sounds + Extremities: no edema, no cyanosis    The results of significant diagnostics from this hospitalization (including imaging, microbiology, ancillary and laboratory) are listed below for reference.     Microbiology: Recent Results (from the past 240 hour(s))  MRSA PCR Screening     Status: None   Collection Time: 05/18/16 12:30 AM  Result Value Ref Range Status   MRSA by PCR NEGATIVE NEGATIVE Final    Comment:        The GeneXpert MRSA Assay (FDA approved for NASAL specimens only), is one component of a comprehensive MRSA colonization surveillance program. It is not intended to diagnose MRSA infection nor to guide  or monitor treatment for MRSA infections.      Labs: BNP (last 3 results)  Recent Labs  05/17/16 2332  BNP [id number]   Basic Metabolic Panel:  Recent Labs Lab 05/17/16 1646 05/17/16 2332 05/18/16 0442  NA 137  --  137  K 4.0  --  3.7  CL 107  --  106  CO2 23  --  24  GLUCOSE 106*  --  106*  BUN 25*  --  21*  CREATININE 1.24  --  0.87  CALCIUM 9.4  --  8.8*  MG 1.5* 1.4*  --    Liver Function Tests: No results for input(s): AST, ALT, ALKPHOS, BILITOT, PROT, ALBUMIN in the last 168 hours. No results for input(s): LIPASE, AMYLASE in the last 168 hours. No results for  input(s): AMMONIA in the last 168 hours. CBC:  Recent Labs Lab 05/17/16 1646 05/18/16 0442 05/19/16 0436  WBC 13.4* 10.3 9.7  HGB 15.5 13.8 14.7  HCT 46.3 41.6 44.5  MCV 95.7 95.9 95.5  PLT 156 133* 122*   Cardiac Enzymes:  Recent Labs Lab 05/17/16 1646 05/17/16 2332 05/18/16 0442 05/18/16 1035  TROPONINI 0.03* 0.24* 0.27* 0.22*   BNP: Invalid input(s): POCBNP CBG:  Recent Labs Lab 05/18/16 1624 05/18/16 2117 05/19/16 0734 05/19/16 1202 05/19/16 1601  GLUCAP 158* 103* 126* 190* 84   D-Dimer  Recent Labs  05/17/16 1646  DDIMER 0.44   Hgb A1c  Recent Labs  05/17/16 2332  HGBA1C 5.8*   Lipid Profile  Recent Labs  05/18/16 0442  CHOL 118  HDL 31*  LDLCALC 58  TRIG 143  CHOLHDL 3.8   Thyroid function studies  Recent Labs  05/17/16 1714  TSH 2.164   Anemia work up No results for input(s): VITAMINB12, FOLATE, FERRITIN, TIBC, IRON, RETICCTPCT in the last 72 hours. Urinalysis    Component Value Date/Time   COLORURINE YELLOW 05/17/2016 2000   APPEARANCEUR CLEAR 05/17/2016 2000   LABSPEC 1.020 05/17/2016 2000   PHURINE 5.5 05/17/2016 2000   GLUCOSEU NEGATIVE 05/17/2016 2000   HGBUR NEGATIVE 05/17/2016 2000   BILIRUBINUR NEGATIVE 05/17/2016 2000   KETONESUR TRACE (A) 05/17/2016 2000   PROTEINUR NEGATIVE 05/17/2016 2000   UROBILINOGEN 0.2 08/05/2012 0957    NITRITE NEGATIVE 05/17/2016 2000   LEUKOCYTESUR NEGATIVE 05/17/2016 2000   Sepsis Labs Invalid input(s): PROCALCITONIN,  WBC,  LACTICIDVEN Microbiology Recent Results (from the past 240 hour(s))  MRSA PCR Screening     Status: None   Collection Time: 05/18/16 12:30 AM  Result Value Ref Range Status   MRSA by PCR NEGATIVE NEGATIVE Final    Comment:        The GeneXpert MRSA Assay (FDA approved for NASAL specimens only), is one component of a comprehensive MRSA colonization surveillance program. It is not intended to diagnose MRSA infection nor to guide or monitor treatment for MRSA infections.      Time coordinating discharge: Over 30 minutes  SIGNED:   Kathie Dike, MD  [facility] 05/19/2016, 4:25 PM Pager   If 7PM-7AM, please contact night-coverage [email or web address] Password [email or web address]

## 2016-05-19 NOTE — Progress Notes (Signed)
Discharge instructions reviewed with patient and wife. Report understanding of provided instructions. Prescriptions faxed to [facility], and provided to patient and wife. IV access discontinued. Patient left unit in wheelchair in stable condition.

## 2016-05-19 NOTE — Discharge Instructions (Signed)
Information on my medicine - ELIQUIS (apixaban)  This medication education was reviewed with me or my healthcare representative as part of my discharge preparation.  Why was Eliquis prescribed for you? Eliquis was prescribed to treat blood clots that may have been found in the veins of your legs (deep vein thrombosis) or in your lungs (pulmonary embolism) and to reduce the risk of them occurring again.  What do You need to know about Eliquis ? The starting dose is 10 mg (two 5 mg tablets) taken TWICE daily for the FIRST SEVEN (7) DAYS, then on (enter date)  05/26/16  the dose is reduced to ONE 5 mg tablet taken TWICE daily.  Eliquis may be taken with or without food.   Try to take the dose about the same time in the morning and in the evening. If you have difficulty swallowing the tablet whole please discuss with your pharmacist how to take the medication safely.  Take Eliquis exactly as prescribed and DO NOT stop taking Eliquis without talking to the doctor who prescribed the medication.  Stopping may increase your risk of developing a new blood clot.  Refill your prescription before you run out.  After discharge, you should have regular check-up appointments with your healthcare provider that is prescribing your Eliquis.    What do you do if you miss a dose? If a dose of ELIQUIS is not taken at the scheduled time, take it as soon as possible on the same day and twice-daily administration should be resumed. The dose should not be doubled to make up for a missed dose.  Important Safety Information A possible side effect of Eliquis is bleeding. You should call your healthcare provider right away if you experience any of the following: ? Bleeding from an injury or your nose that does not stop. ? Unusual colored urine (red or dark brown) or unusual colored stools (red or black). ? Unusual bruising for unknown reasons. ? A serious fall or if you hit your head (even if there is no  bleeding).  Some medicines may interact with Eliquis and might increase your risk of bleeding or clotting while on Eliquis. To help avoid this, consult your healthcare provider or pharmacist prior to using any new prescription or non-prescription medications, including herbals, vitamins, non-steroidal anti-inflammatory drugs (NSAIDs) and supplements.  This website has more information on Eliquis (apixaban): http://www.eliquis.com/eliquis/home

## 2016-05-19 NOTE — Progress Notes (Signed)
Subjective:  No chest pain overnight.  Objective:  Vital Signs in the last 24 hours: Temp:  [97 F (36.1 C)-98.4 F (36.9 C)] 97.8 F (36.6 C) (08/29 0817) Pulse Rate:  [60-88] 67 (08/29 0825) Resp:  [14-23] 23 (08/29 0100) BP: (104-149)/(53-88) 142/86 (08/29 0825) SpO2:  [94 %-99 %] 98 % (08/29 0830) Weight:  [152 lb 12.5 oz (69.3 kg)] 152 lb 12.5 oz (69.3 kg) (08/29 0500)  Intake/Output from previous day:  Intake/Output Summary (Last 24 hours) at 05/19/16 1114 Last data filed at 05/18/16 1800  Gross per 24 hour  Intake            387.5 ml  Output              475 ml  Net            -87.5 ml    Physical Exam: General appearance: alert, cooperative and no distress Lungs: clear to auscultation bilaterally Heart: regular rate and rhythm Skin: Skin color, texture, turgor normal. No rashes or lesions Neurologic: Grossly normal   Rate: 68  Rhythm: normal sinus rhythm  Lab Results:  Recent Labs  05/18/16 0442 05/19/16 0436  WBC 10.3 9.7  HGB 13.8 14.7  PLT 133* 122*    Recent Labs  05/17/16 1646 05/18/16 0442  NA 137 137  K 4.0 3.7  CL 107 106  CO2 23 24  GLUCOSE 106* 106*  BUN 25* 21*  CREATININE 1.24 0.87    Recent Labs  05/18/16 0442 05/18/16 1035  TROPONINI 0.27* 0.22*    Recent Labs  05/17/16 1646  INR 1.05    Scheduled Meds: . apixaban  5 mg Oral BID  . aspirin EC  81 mg Oral Daily  . atorvastatin  40 mg Oral q1800  . carvedilol  3.125 mg Oral BID WC  . finasteride  5 mg Oral Daily  . insulin aspart  0-9 Units Subcutaneous TID WC  . mometasone-formoterol  2 puff Inhalation BID  . off the beat book   Does not apply Once  . regadenoson      . sodium chloride flush      . traZODone  100 mg Oral QHS   Continuous Infusions: . sodium chloride 10 mL/hr at 05/18/16 1624  . diltiazem (CARDIZEM) infusion Stopped (05/18/16 0200)   PRN Meds:.clonazePAM, ondansetron (ZOFRAN) IV, oxyCODONE-acetaminophen, technetium tetrofosmin,  technetium tetrofosmin   Imaging: Imaging results have been reviewed  Cardiac Studies:Echo 05/18/16 Study Conclusions  - Left ventricle: Turbulent flow through LVOT. Systolic anterior   motio of MV leaflets Peak gradient through LV/LVOT /AV is 15 mm   Hg at rest No signifiant obstruction at rest. The cavity size was   normal. Wall thickness was increased in a pattern of mild LVH.   Systolic function was vigorous. The estimated ejection fraction   was in the range of 65% to 70%. Doppler parameters are consistent   with abnormal left ventricular relaxation (grade 1 diastolic   dysfunction). - Aortic valve: AV is thickened, calcified with minimally   restricted motion. - Mitral valve: SAM of mitral valve leaflets. Calcified annulus.   Mildly thickened leaflets . - Left atrium: The atrium was mildly dilated. - Pulmonary arteries: PA peak pressure: 32 mm Hg (S).  Assessment/Plan:  70 y/o [place] followed at the [place]. He has a history of HTN, DM, dyslipidemia, and heavy smoking (2 ppd). He has no prior history of CAD or MI. He presented to the ED at  [facility] 05/18/16 with generalized weakness. In the ED he was noted to be hypotensive and in AF with RVR. He also had an elevated Troponin- 0.03-on adm, pk of 0.27. He has since converted to NSR.  He is CHADs VASc is 3 for DM, HTN, and age. Eliquis added.    Active Problems:   Atrial fibrillation with RVR (HCC)   Troponin level elevated   Diabetes mellitus (HCC)   Essential hypertension, benign   COPD (chronic obstructive pulmonary disease) (HCC)   Chest pain with moderate risk of acute coronary syndrome   Anticoagulated-Eliquis started   Hyperlipidemia   BPH (benign prostatic hyperplasia)   Tobacco use   PLAN: Myoview today- images pending.   Kerin Ransom PA-C 05/19/2016, 11:14 AM [phone]  Patient examined chart reviewed Maintaining NSR On Eliquis now Getting myovue today No chest  Pain this am  Lungs with no wheezing  but Clinical COPD  Jenkins Rouge

## 2016-05-20 ENCOUNTER — Telehealth (HOSPITAL_COMMUNITY): Payer: Self-pay | Admitting: *Deleted

## 2016-05-20 NOTE — Telephone Encounter (Signed)
Pt was on ED referral to afib workqueue.  I callled to schedule appt for patient but he is declining visit at this time until he goes to the [place] for f/u and they determine if he needs to come see [place].  Pt was given phone number for f/u if he changes his mind

## 2016-05-21 ENCOUNTER — Emergency Department (HOSPITAL_COMMUNITY): Payer: Medicare Other

## 2016-05-21 ENCOUNTER — Emergency Department (HOSPITAL_COMMUNITY)
Admission: EM | Admit: 2016-05-21 | Discharge: 2016-05-21 | Disposition: A | Discharge status: Home / Self Care | Payer: Medicare Other | Attending: Emergency Medicine | Admitting: Emergency Medicine

## 2016-05-21 ENCOUNTER — Encounter (HOSPITAL_COMMUNITY): Payer: Self-pay | Admitting: Radiology

## 2016-05-21 DIAGNOSIS — Z7984 Long term (current) use of oral hypoglycemic drugs: Secondary | ICD-10-CM | POA: Diagnosis not present

## 2016-05-21 DIAGNOSIS — Z79899 Other long term (current) drug therapy: Secondary | ICD-10-CM | POA: Diagnosis not present

## 2016-05-21 DIAGNOSIS — E119 Type 2 diabetes mellitus without complications: Secondary | ICD-10-CM | POA: Insufficient documentation

## 2016-05-21 DIAGNOSIS — Z7982 Long term (current) use of aspirin: Secondary | ICD-10-CM | POA: Insufficient documentation

## 2016-05-21 DIAGNOSIS — I4891 Unspecified atrial fibrillation: Secondary | ICD-10-CM | POA: Diagnosis not present

## 2016-05-21 DIAGNOSIS — R05 Cough: Secondary | ICD-10-CM | POA: Diagnosis not present

## 2016-05-21 DIAGNOSIS — I10 Essential (primary) hypertension: Secondary | ICD-10-CM | POA: Insufficient documentation

## 2016-05-21 DIAGNOSIS — J40 Bronchitis, not specified as acute or chronic: Secondary | ICD-10-CM | POA: Diagnosis not present

## 2016-05-21 DIAGNOSIS — F17210 Nicotine dependence, cigarettes, uncomplicated: Secondary | ICD-10-CM | POA: Diagnosis not present

## 2016-05-21 DIAGNOSIS — R0602 Shortness of breath: Secondary | ICD-10-CM | POA: Diagnosis not present

## 2016-05-21 DIAGNOSIS — R059 Cough, unspecified: Secondary | ICD-10-CM

## 2016-05-21 DIAGNOSIS — J449 Chronic obstructive pulmonary disease, unspecified: Secondary | ICD-10-CM | POA: Insufficient documentation

## 2016-05-21 LAB — BASIC METABOLIC PANEL
Anion gap: 9 (ref 5–15)
BUN: 18 mg/dL (ref 6–20)
CO2: 26 mmol/L (ref 22–32)
Calcium: 9.8 mg/dL (ref 8.9–10.3)
Chloride: 101 mmol/L (ref 101–111)
Creatinine, Ser: 1.11 mg/dL (ref 0.61–1.24)
GFR calc Af Amer: >60 mL/min (ref >60)
GFR calc non Af Amer: >60 mL/min (ref >60)
Glucose, Bld: 114 mg/dL — ABNORMAL HIGH (ref 65–99)
Potassium: 4.2 mmol/L (ref 3.5–5.1)
Sodium: 136 mmol/L (ref 135–145)

## 2016-05-21 LAB — CBC
HCT: 47.5 % (ref 39.0–52.0)
Hemoglobin: 16 g/dL (ref 13.0–17.0)
MCH: 32.3 pg (ref 26.0–34.0)
MCHC: 33.7 g/dL (ref 30.0–36.0)
MCV: 96 fL (ref 78.0–100.0)
Platelets: 151 10*3/uL (ref 150–400)
RBC: 4.95 MIL/uL (ref 4.22–5.81)
RDW: 13.5 % (ref 11.5–15.5)
WBC: 11.1 10*3/uL — ABNORMAL HIGH (ref 4.0–10.5)

## 2016-05-21 LAB — TROPONIN I
Troponin I: 0.03 ng/mL (ref <0.03)
Troponin I: 0.03 ng/mL (ref <0.03)

## 2016-05-21 LAB — PROTIME-INR
INR: 1.37
Prothrombin Time: 17 seconds — ABNORMAL HIGH (ref 11.4–15.2)

## 2016-05-21 MED ORDER — AZITHROMYCIN 250 MG PO TABS
500.0000 mg | ORAL_TABLET | Freq: Once | ORAL | Status: AC
  Administered 2016-05-21: 500 mg via ORAL
  Filled 2016-05-21: qty 2

## 2016-05-21 MED ORDER — IOPAMIDOL (ISOVUE-370) INJECTION 76%
100.0000 mL | Freq: Once | INTRAVENOUS | Status: AC | PRN
  Administered 2016-05-21: 100 mL via INTRAVENOUS

## 2016-05-21 MED ORDER — ALBUTEROL SULFATE (2.5 MG/3ML) 0.083% IN NEBU
5.0000 mg | INHALATION_SOLUTION | Freq: Once | RESPIRATORY_TRACT | Status: AC
  Administered 2016-05-21: 5 mg via RESPIRATORY_TRACT
  Filled 2016-05-21: qty 6

## 2016-05-21 MED ORDER — ALBUTEROL SULFATE HFA 108 (90 BASE) MCG/ACT IN AERS
2.0000 | INHALATION_SPRAY | RESPIRATORY_TRACT | Status: DC | PRN
  Administered 2016-05-21: 2 via RESPIRATORY_TRACT
  Filled 2016-05-21: qty 6.7

## 2016-05-21 MED ORDER — AZITHROMYCIN 250 MG PO TABS: ORAL_TABLET | ORAL | 0 refills | Status: DC

## 2016-05-21 MED ORDER — IPRATROPIUM BROMIDE 0.02 % IN SOLN
0.5000 mg | Freq: Once | RESPIRATORY_TRACT | Status: AC
  Administered 2016-05-21: 0.5 mg via RESPIRATORY_TRACT
  Filled 2016-05-21: qty 2.5

## 2016-05-21 MED ORDER — ALBUTEROL SULFATE HFA 108 (90 BASE) MCG/ACT IN AERS: 2.0000 | INHALATION_SPRAY | RESPIRATORY_TRACT | 1 refills | Status: DC | PRN

## 2016-05-21 NOTE — ED Notes (Signed)
CRITICAL VALUE ALERT  Critical value received:  Troponin 0.03  Date of notification:  05/21/2016  Time of notification:  1206  Nurse who received alert:  Elmyra Ricks  Responding MD:  Scotty Court

## 2016-05-21 NOTE — ED Provider Notes (Signed)
[facility] DEPT Provider Note   CSN: [id number] Arrival date & time: 05/21/16  1100     History   Chief Complaint Chief Complaint  Patient presents with  . Shortness of Breath    HPI Nicolas Berry is a 70 y.o. male.  Patient w recent dx afib, on eliquis, hx smoking/copd, c/o increased cough in the past several days. Moderate, episodic, persistent., felt worse today.  Mild sob. Occasional brings up clear to whitish phlegm.  No sore throat or runny nose. No fever or chills. Denies chest pain or discomfort. Patient states it feels as if something is stuck in chest, that is preventing him from coughing stuff up.  No wt loss. No sweats. Recently stopped smoking a few days ago after admission w afib. Denies hx cad. No exertional or episodic chest pain or discomfort. No pleuritic pain. No leg pain or swelling.      The history is provided by the patient and the spouse.  Shortness of Breath  Associated symptoms include cough. Pertinent negatives include no fever, no headaches, no sore throat, no neck pain, no chest pain, no vomiting, no abdominal pain, no rash and no leg swelling.    Past Medical History:  Diagnosis Date  . Anxiety    OA multiple joints  . Arthritis   . BPH (benign prostatic hyperplasia)   . COPD (chronic obstructive pulmonary disease) ([place])   . DDD (degenerative disc disease), lumbar   . Diabetes mellitus without complication ([place])   . Fatty liver   . GERD (gastroesophageal reflux disease)   . Hyperlipidemia   . Hypertension     Patient Active Problem List   Diagnosis Date Noted  . Anticoagulated-Eliquis started 05/19/2016  . Troponin level elevated 05/19/2016  . Chest pain with moderate risk of acute coronary syndrome 05/18/2016  . Atrial fibrillation with RVR ([place]) 05/17/2016  . Diabetes mellitus ([place]) 09/09/2012  . Essential hypertension, benign 09/09/2012  . Hyperlipidemia 09/09/2012  . BPH (benign prostatic hyperplasia) 09/09/2012  . COPD  (chronic obstructive pulmonary disease) ([place]) 09/09/2012  . Generalized OA 09/09/2012  . Anxiety 09/09/2012  . Tobacco use 09/09/2012    Past Surgical History:  Procedure Laterality Date  . BACK SURGERY    . HERNIA REPAIR         Home Medications    Prior to Admission medications   Medication Sig Start Date End Date Taking? Authorizing Provider  acarbose (PRECOSE) 50 MG tablet Take 25 mg by mouth daily.    Historical Provider, MD  albuterol (PROVENTIL HFA;VENTOLIN HFA) 108 (90 BASE) MCG/ACT inhaler Inhale 2 puffs into the lungs every 4 (four) hours as needed.    Historical Provider, MD  apixaban (ELIQUIS) 5 MG TABS tablet Take 1 tablet (5 mg total) by mouth 2 (two) times daily. 05/19/16   Kathie Dike, MD  aspirin EC 81 MG tablet Take 81 mg by mouth daily.    Historical Provider, MD  budesonide-formoterol (SYMBICORT) 160-4.5 MCG/ACT inhaler Inhale 2 puffs into the lungs 2 (two) times daily. dOSE UNKNOWN    Historical Provider, MD  carvedilol (COREG) 3.125 MG tablet Take 1 tablet (3.125 mg total) by mouth 2 (two) times daily with a meal. 05/19/16   Kathie Dike, MD  clonazePAM (KLONOPIN) 0.5 MG tablet Take 0.5 mg by mouth 2 (two) times daily as needed.    Historical Provider, MD  finasteride (PROSCAR) 5 MG tablet Take 5 mg by mouth daily.    Historical Provider, MD  HYDROcodone-acetaminophen (NORCO/VICODIN) 5-325 MG  per tablet Take 1 tablet by mouth every 6 (six) hours as needed.     Historical Provider, MD  lisinopril (PRINIVIL,ZESTRIL) 40 MG tablet Take 40 mg by mouth daily.    Historical Provider, MD  metFORMIN (GLUCOPHAGE) 1000 MG tablet Take 1,000 mg by mouth 2 (two) times daily.    Historical Provider, MD  simvastatin (ZOCOR) 80 MG tablet Take 40 mg by mouth at bedtime.    Historical Provider, MD  traZODone (DESYREL) 100 MG tablet Take 100 mg by mouth at bedtime.    Historical Provider, MD  Vitamin D, Ergocalciferol, (DRISDOL) 50000 UNITS CAPS Take 50,000 Units by mouth every  30 (thirty) days.    Historical Provider, MD    Family History Family History  Problem Relation Age of Onset  . Cancer Mother     bladder  . Diabetes Father   . Cancer Sister     breast     Social History Social History  Substance Use Topics  . Smoking status: Current Every Day Smoker    Packs/day: 2.00    Types: Cigarettes  . Smokeless tobacco: Never Used  . Alcohol use No     Allergies   Review of patient's allergies indicates no known allergies.   Review of Systems Review of Systems  Constitutional: Negative for chills and fever.  HENT: Negative for sore throat.   Eyes: Negative for redness.  Respiratory: Positive for cough and shortness of breath.   Cardiovascular: Negative for chest pain and leg swelling.  Gastrointestinal: Negative for abdominal pain and vomiting.  Genitourinary: Negative for flank pain.  Musculoskeletal: Negative for back pain and neck pain.  Skin: Negative for rash.  Neurological: Negative for headaches.  Hematological: Does not bruise/bleed easily.  Psychiatric/Behavioral: Negative for confusion.     Physical Exam Updated Vital Signs Pulse 66   Temp 97.8 F (36.6 C) (Oral)   Resp 16   Ht 5\' 10"  (1.778 m)   Wt 68.9 kg   SpO2 97%   BMI 21.81 kg/m   Physical Exam  Constitutional: He appears well-developed and well-nourished. No distress.  HENT:  Head: Atraumatic.  Mouth/Throat: Oropharynx is clear and moist.  Eyes: Conjunctivae are normal.  Neck: Neck supple. No tracheal deviation present.  Cardiovascular: Normal rate, regular rhythm, normal heart sounds and intact distal pulses.  Exam reveals no gallop and no friction rub.   No murmur heard. Pulmonary/Chest: Effort normal and breath sounds normal. No accessory muscle usage. No respiratory distress.  Abdominal: Soft. Bowel sounds are normal. He exhibits no distension. There is no tenderness.  Musculoskeletal: He exhibits no edema or tenderness.  Neurological: He is alert.    Skin: Skin is warm and dry. He is not diaphoretic.  Psychiatric: He has a normal mood and affect.  Nursing note and vitals reviewed.    ED Treatments / Results  Labs (all labs ordered are listed, but only abnormal results are displayed) Results for orders placed or performed during the hospital encounter of [id number]  Basic metabolic panel  Result Value Ref Range   Sodium 136 135 - 145 mmol/L   Potassium 4.2 3.5 - 5.1 mmol/L   Chloride 101 101 - 111 mmol/L   CO2 26 22 - 32 mmol/L   Glucose, Bld 114 (H) 65 - 99 mg/dL   BUN 18 6 - 20 mg/dL   Creatinine, Ser 1.11 0.61 - 1.24 mg/dL   Calcium 9.8 8.9 - 10.3 mg/dL   GFR calc non Af Amer >60 >60 mL/min  GFR calc Af Amer >60 >60 mL/min   Anion gap 9 5 - 15  CBC  Result Value Ref Range   WBC 11.1 (H) 4.0 - 10.5 K/uL   RBC 4.95 4.22 - 5.81 MIL/uL   Hemoglobin 16.0 13.0 - 17.0 g/dL   HCT 47.5 39.0 - 52.0 %   MCV 96.0 78.0 - 100.0 fL   MCH 32.3 26.0 - 34.0 pg   MCHC 33.7 30.0 - 36.0 g/dL   RDW 13.5 11.5 - 15.5 %   Platelets 151 150 - 400 K/uL  Troponin I  Result Value Ref Range   Troponin I 0.03 (HH) <0.03 ng/mL  Protime-INR (order if Patient is taking Coumadin / Warfarin)  Result Value Ref Range   Prothrombin Time 17.0 (H) 11.4 - 15.2 seconds   INR 1.37   Troponin I  Result Value Ref Range   Troponin I 0.03 (HH) <0.03 ng/mL   Dg Chest 2 View  Result Date: 05/21/2016 CLINICAL DATA:  70 year old male with shortness of breath and cough. EXAM: CHEST  2 VIEW COMPARISON:  05/17/2016 chest radiograph FINDINGS: The cardiomediastinal silhouette is unremarkable. There is no evidence of focal airspace disease, pulmonary edema, suspicious pulmonary nodule/mass, pleural effusion, or pneumothorax. No acute bony abnormalities are identified. IMPRESSION: No active cardiopulmonary disease. Electronically Signed   By: Margarette Canada M.D.   On: 05/21/2016 11:49   Ct Angio Chest Pe W Or Wo Contrast  Result Date: 05/21/2016 CLINICAL DATA:   70 year old with recent diagnosis of atrial fibrillation, presenting with acute onset of shortness of breath, dizziness and difficulty swallowing. Current history of hypertension and diabetes. EXAM: CT ANGIOGRAPHY CHEST WITH CONTRAST TECHNIQUE: Multidetector CT imaging of the chest was performed using the standard protocol during bolus administration of intravenous contrast. Multiplanar CT image reconstructions and MIPs were obtained to evaluate the vascular anatomy. CONTRAST:  100 mL Isovue 370 IV. COMPARISON:  No prior CT.  Chest x-ray 05/21/2016, 05/17/2016. FINDINGS: Technical quality:  Very good. Pulmonary embolism:  Absent. Cardiovascular: Heart mildly enlarged with left ventricular predominance and left ventricular hypertrophy. Mitral annular calcification. Mild aortic valvular calcification. Moderate three-vessel coronary atherosclerosis. No pericardial effusion. Mild atherosclerosis involving the thoracic and upper abdominal aorta without evidence of aneurysm or dissection. Direct origin of the left vertebral artery from the aortic arch. Mediastinum/Nodes: No pathologic lymphadenopathy. Mild circumferential wall thickening involving the upper thoracic esophagus and the distal esophagus just above the esophagogastric junction. No evidence of hiatal hernia. No discrete esophageal mass. Visualized thyroid gland unremarkable. Lungs/Pleura: Emphysematous changes throughout both lungs. Pulmonary parenchyma clear without localized airspace consolidation, interstitial disease, or parenchymal nodules or masses. Central airways patent without significant bronchial wall thickening. No pleural effusions. No pleural plaques or masses. Upper Abdomen: Multiple cysts involving the visualized liver. No significant abnormalities allowing for the early arterial phase of enhancement. Musculoskeletal: Degenerative disc disease and spondylosis involving the visualized lower cervical spine. Mild degenerative changes involving the  thoracic spine. No acute abnormalities. Review of the MIP images confirms the above findings. IMPRESSION: 1. No evidence of acute pulmonary embolism. 2. Mild cardiomegaly. Left ventricular hypertrophy. Mild aortic valvular calcification. 3. Moderate three-vessel coronary atherosclerosis and mild aortic atherosclerosis. 4. Mild circumferential wall thickening involving the upper thoracic esophagus and of the distal esophagus. Query esophagitis as no distinct mass is identified. Upper endoscopy may be confirmatory. 5. COPD/emphysema.  No acute cardiopulmonary disease. Electronically Signed   By: Evangeline Dakin M.D.   On: 05/21/2016 13:27   Nm Myocar Multi W/spect  W/wall Motion / Ef  Result Date: 05/19/2016  There was no ST segment deviation noted during stress.  No T wave inversion was noted during stress.  The study is normal.  This is a low risk study.  The left ventricular ejection fraction is hyperdynamic (>65%).    Dg Chest Port 1 View  Result Date: 05/17/2016 CLINICAL DATA:  Worsening shortness of breath today with tachycardia. Smoker. EXAM: PORTABLE CHEST 1 VIEW COMPARISON:  Limited correlation made with abdominal CT 08/05/2012. FINDINGS: 1713 hours. Lordotic positioning. The heart size and mediastinal contours are normal. The lungs are clear. There is no pleural effusion or pneumothorax. No acute osseous findings are identified. Telemetry leads overlie the chest. IMPRESSION: No active cardiopulmonary process. Electronically Signed   By: Richardean Sale M.D.   On: 05/17/2016 17:29    EKG  EKG Interpretation  Date/Time:  Thursday May 21 2016 11:10:31 EDT Ventricular Rate:  64 PR Interval:    QRS Duration: 85 QT Interval:  386 QTC Calculation: 399 R Axis:   -29 Text Interpretation:  Sinus rhythm No significant change since last tracing Confirmed by Ashok Cordia  MD, Lennette Bihari ([id number]) on 05/21/2016 11:36:22 AM       Radiology Nm Myocar Multi W/spect W/wall Motion / Ef  Result Date:  05/19/2016  There was no ST segment deviation noted during stress.  No T wave inversion was noted during stress.  The study is normal.  This is a low risk study.  The left ventricular ejection fraction is hyperdynamic (>65%).     Procedures Procedures (including critical care time)  Medications Ordered in ED Medications - No data to display   Initial Impression / Assessment and Plan / ED Course  I have reviewed the triage vital signs and the nursing notes.  Pertinent labs & imaging results that were available during my care of the patient were reviewed by me and considered in my medical decision making (see chart for details).  Clinical Course    Iv ns. Continuous pulse ox and monitor. Labs. Cxr.  Reviewed nursing notes and prior charts for additional history.   Labs. Imaging studies.  CT neg for PE. No lung mass.  Esophageal thickening noted on ct - discuss w pt, including need for pcp/gi f/u, endoscopy. Pt voices understanding.    Recheck, no chest pain or discomfort. Breathing comfortably.  Episodic cough, w small amt sputum.  Pt also gives hx recent smoking cessation.    Mild congestion on repeat lung exam.  No cp or increased wob.  Will rx for bronchitis/copd.   mdi and abx given.   Initial and repeat trop decreased from prior, and not increasing between initial and repeat today. No cp or discomfort.   rec close pcp, gi, and card f/u as outpatient.  Patient currently appears stable for d/c.     Final Clinical Impressions(s) / ED Diagnoses   Final diagnoses:  None    New Prescriptions New Prescriptions   No medications on file     Lajean Saver, MD 05/21/16 1553

## 2016-05-21 NOTE — ED Triage Notes (Signed)
Pt with hx of recently diagnosed A fib reports SOB and difficulty swallowing. Pt also reports dizziness. A & Ox4, respirations even and unlabored, skin warm, dry, and intact.

## 2016-05-21 NOTE — Discharge Instructions (Addendum)
It was our pleasure to provide your ER care today - we hope that you feel better.  Take antibiotic as prescribed.   Use albuterol inhaler as need.  Keep up the good work, and continue with your smoking cessation.  From today's CT scan, the radiologist notes: Moderate three-vessel coronary atherosclerosis and mild aortic atherosclerosis. Mild circumferential wall thickening involving the upper thoracic esophagus and of the distal esophagus. Query esophagitis as no distinct mass is identified. Upper endoscopy may be confirmatory. COPD/emphysema.   For heart issues, follow up with cardiologist in the next 1-2 weeks - call office to arrange appointment.  For esophagus thickening, follow up with your doctor in the coming week - discuss with them referral to GI doctor for upper endoscopy.    Return to ER right away if worse, increased difficulty breathing, chest pain, weak/fainting, persistent fast heart beat, other concern.

## 2016-05-21 NOTE — ED Notes (Signed)
MD at bedside. 

## 2016-05-21 NOTE — ED Notes (Signed)
RT paged.

## 2016-06-15 ENCOUNTER — Ambulatory Visit (HOSPITAL_COMMUNITY)
Admission: RE | Admit: 2016-06-15 | Discharge: 2016-06-15 | Disposition: A | Discharge status: Home / Self Care | Payer: Medicare Other | Source: Ambulatory Visit | Attending: Nurse Practitioner | Admitting: Nurse Practitioner

## 2016-06-15 ENCOUNTER — Encounter (HOSPITAL_COMMUNITY): Payer: Self-pay | Admitting: Nurse Practitioner

## 2016-06-15 VITALS — BP 126/74 | HR 61 | Ht 70.0 in | Wt 151.2 lb

## 2016-06-15 DIAGNOSIS — M5136 Other intervertebral disc degeneration, lumbar region: Secondary | ICD-10-CM | POA: Diagnosis not present

## 2016-06-15 DIAGNOSIS — Z87891 Personal history of nicotine dependence: Secondary | ICD-10-CM | POA: Diagnosis not present

## 2016-06-15 DIAGNOSIS — J449 Chronic obstructive pulmonary disease, unspecified: Secondary | ICD-10-CM | POA: Diagnosis not present

## 2016-06-15 DIAGNOSIS — I480 Paroxysmal atrial fibrillation: Secondary | ICD-10-CM | POA: Insufficient documentation

## 2016-06-15 DIAGNOSIS — N400 Enlarged prostate without lower urinary tract symptoms: Secondary | ICD-10-CM | POA: Insufficient documentation

## 2016-06-15 DIAGNOSIS — R1310 Dysphagia, unspecified: Secondary | ICD-10-CM | POA: Insufficient documentation

## 2016-06-15 DIAGNOSIS — Z7984 Long term (current) use of oral hypoglycemic drugs: Secondary | ICD-10-CM | POA: Insufficient documentation

## 2016-06-15 DIAGNOSIS — Z7901 Long term (current) use of anticoagulants: Secondary | ICD-10-CM | POA: Insufficient documentation

## 2016-06-15 DIAGNOSIS — E119 Type 2 diabetes mellitus without complications: Secondary | ICD-10-CM | POA: Insufficient documentation

## 2016-06-15 DIAGNOSIS — F419 Anxiety disorder, unspecified: Secondary | ICD-10-CM | POA: Insufficient documentation

## 2016-06-15 DIAGNOSIS — I10 Essential (primary) hypertension: Secondary | ICD-10-CM | POA: Insufficient documentation

## 2016-06-15 DIAGNOSIS — R634 Abnormal weight loss: Secondary | ICD-10-CM | POA: Diagnosis not present

## 2016-06-15 DIAGNOSIS — K219 Gastro-esophageal reflux disease without esophagitis: Secondary | ICD-10-CM | POA: Diagnosis not present

## 2016-06-15 DIAGNOSIS — E785 Hyperlipidemia, unspecified: Secondary | ICD-10-CM | POA: Diagnosis not present

## 2016-06-15 DIAGNOSIS — R42 Dizziness and giddiness: Secondary | ICD-10-CM | POA: Diagnosis not present

## 2016-06-15 NOTE — Patient Instructions (Signed)
Your physician has recommended you make the following change in your medication:  1)Stop aspirin 2) IF you notice your heart rate staying above 100 (and your blood pressure is above 100 on the top) you may take an extra coreg 3.125mg   Talk with the [facility] concerning.Marland KitchenMarland Kitchen 1)difficulty swallowing 2)Weight loss 3)Dizziness 4)left sided bruit 5)Snoring and daytime drowsiness - possible need for sleep study   Scheduler will be in touch with you regarding appointment with cardiologist in [place].

## 2016-06-16 NOTE — Progress Notes (Signed)
Primary Care Physician: PROVIDER NOT [name] Referring Physician: [name]  f/u   Nicolas Berry is a 70 y.o. male with a h/o new onset afib 8/27 when he presented to ER  and found to have RVR. He was started on heparin and cardizem and converted to SR. He did smoke two packs a day but stopped when he was in the hospital, now with cessation x 6 weeks. Chadsvasc score of 3 (htn, DM, age) and was discharged on eliquis 5 mg a day.  He was seen in the ER 8/31, for bronchitis symptoms.He has not had any further symptoms with afib. He has cut down on caffeine. Possible sleep apnea. Feels like he is having trouble swallowing. C/o dizziness at times for years, out of the blue sometimes after he has been on his feet for a while. Also, has lost weight from 180-150 lbs over the last 6 months. At first was intentional, but cannot explain recent weight loss. He wants to discuss all these issues at the [facility] when seen next week. No alcohol use, active, but no regular exercise. No bleeding issues with apixaban. D/C on carvedilol 3.125 mg bid with ekg showing NSR at 61 bpm with lateral T wave changes. He did have an echo with normal EF, left atrium mildly dilated, 40 mm. CT of chest, no evidence of PE. LVH, Moderate three vessel CAD and mild aortic atherosclerosis. Mild thickening of the upper thoracic esophagus, but no distinct mass identified. COPD/emphysema. Stress Myoview with no T wave inversion, study low risk with EF of 65%.  Today, he denies symptoms of palpitations, chest pain, shortness of breath, orthopnea, PND, lower extremity edema, dizziness, presyncope, syncope, or neurologic sequela. The patient is tolerating medications without difficulties and is otherwise without complaint today.   Past Medical History:  Diagnosis Date  . Anxiety    OA multiple joints  . Arthritis   . BPH (benign prostatic hyperplasia)   . COPD (chronic obstructive pulmonary disease) ([place])   . DDD (degenerative disc disease),  lumbar   . Diabetes mellitus without complication ([place])   . Fatty liver   . GERD (gastroesophageal reflux disease)   . Hyperlipidemia   . Hypertension    Past Surgical History:  Procedure Laterality Date  . BACK SURGERY    . HERNIA REPAIR      Current Outpatient Prescriptions  Medication Sig Dispense Refill  . acarbose (PRECOSE) 50 MG tablet Take 25 mg by mouth daily.    Marland Kitchen albuterol (PROVENTIL HFA;VENTOLIN HFA) 108 (90 BASE) MCG/ACT inhaler Inhale 2 puffs into the lungs every 4 (four) hours as needed.    Marland Kitchen albuterol (PROVENTIL HFA;VENTOLIN HFA) 108 (90 Base) MCG/ACT inhaler Inhale 2 puffs into the lungs every 4 (four) hours as needed for wheezing or shortness of breath. 1 Inhaler 1  . apixaban (ELIQUIS) 5 MG TABS tablet Take 1 tablet (5 mg total) by mouth 2 (two) times daily. 60 tablet 1  . budesonide-formoterol (SYMBICORT) 160-4.5 MCG/ACT inhaler Inhale 2 puffs into the lungs 2 (two) times daily. dOSE UNKNOWN    . carvedilol (COREG) 3.125 MG tablet Take 1 tablet (3.125 mg total) by mouth 2 (two) times daily with a meal. 60 tablet 1  . clonazePAM (KLONOPIN) 0.5 MG tablet Take 0.5 mg by mouth 2 (two) times daily as needed.    . finasteride (PROSCAR) 5 MG tablet Take 5 mg by mouth daily.    Marland Kitchen HYDROcodone-acetaminophen (NORCO/VICODIN) 5-325 MG per tablet Take 1 tablet by mouth  every 6 (six) hours as needed.     Marland Kitchen lisinopril (PRINIVIL,ZESTRIL) 40 MG tablet Take 40 mg by mouth daily.    . metFORMIN (GLUCOPHAGE) 1000 MG tablet Take 1,000 mg by mouth 2 (two) times daily.    . simvastatin (ZOCOR) 80 MG tablet Take 40 mg by mouth at bedtime.    . traZODone (DESYREL) 100 MG tablet Take 100 mg by mouth at bedtime.    . Vitamin D, Ergocalciferol, (DRISDOL) 50000 UNITS CAPS Take 50,000 Units by mouth every 30 (thirty) days.     No current facility-administered medications for this encounter.     No Known Allergies  Social History   Social History  . Marital status: Married    Spouse name:  Nicolas Berry  . Number of children: Nicolas Berry  . Years of education: Nicolas Berry   Occupational History  . Not on file.   Social History Main Topics  . Smoking status: Former Smoker    Packs/day: 2.00    Types: Cigarettes  . Smokeless tobacco: Never Used     Comment: 04/2016-quit  . Alcohol use No  . Drug use:     Types: Marijuana  . Sexual activity: Not on file   Other Topics Concern  . Not on file   Social History Narrative  . No narrative on file    Family History  Problem Relation Age of Onset  . Cancer Mother     bladder  . Diabetes Father   . Cancer Sister     breast     ROS- All systems are reviewed and negative except as per the HPI above  Physical Exam: Vitals:   06/15/16 1328  BP: 126/74  Pulse: 61  Weight: 151 lb 3.2 oz (68.6 kg)  Height: 5\' 10"  (1.778 m)    GEN- The patient is well appearing, alert and oriented x 3 today.   Head- normocephalic, atraumatic Eyes-  Sclera clear, conjunctiva pink Ears- hearing intact Oropharynx- clear Neck- supple, no JVP, Soft bruit left neck Lymph- no cervical lymphadenopathy Lungs- Clear to ausculation bilaterally, normal work of breathing Heart- Regular rate and rhythm, no murmurs, rubs or gallops, PMI not laterally displaced GI- soft, NT, ND, + BS Extremities- no clubbing, cyanosis, or edema MS- no significant deformity or atrophy Skin- no rash or lesion Psych- euthymic mood, full affect Neuro- strength and sensation are intact  EKG- NSR, 61 bpm, pr int 144 ms, qrs int 72 ms, qtc 388 ms Epic records reviewed See HPI for results of echo, myoview, and CT of chest  Assessment and Plan: 1. PAF Staying in SR with addition of carvedilol 3.125 mg bid Continue eliquis  Continue off tobacco, minimal caffeine use Congratulated on tobacco cessation General afib education given to wife and pt as well as printed pamphlet   2. Weight loss, swallowing difficulties Is going to discuss at [facility] next week as well  3. Possible sleep  apnea Wants to address at the [place] as well  4.Dizziness Soft bruit left carotid Discuss at [facility] to see if carotid ultrasound is needed  Wishes to establish  with [facility] cardiology and will request appointment afib clinic as needed  Geroge Baseman. Latravious Levitt, [facility] [address] [place], [place] [address] [phone]

## 2016-07-07 ENCOUNTER — Ambulatory Visit (INDEPENDENT_AMBULATORY_CARE_PROVIDER_SITE_OTHER): Payer: Medicare Other | Admitting: Cardiology

## 2016-07-07 ENCOUNTER — Encounter: Payer: Self-pay | Admitting: Cardiology

## 2016-07-07 VITALS — BP 105/65 | HR 76 | Ht 70.0 in | Wt 151.4 lb

## 2016-07-07 DIAGNOSIS — I10 Essential (primary) hypertension: Secondary | ICD-10-CM

## 2016-07-07 DIAGNOSIS — I2510 Atherosclerotic heart disease of native coronary artery without angina pectoris: Secondary | ICD-10-CM

## 2016-07-07 DIAGNOSIS — I480 Paroxysmal atrial fibrillation: Secondary | ICD-10-CM

## 2016-07-07 MED ORDER — CARVEDILOL 3.125 MG PO TABS: 3.1250 mg | ORAL_TABLET | Freq: Two times a day (BID) | ORAL | 6 refills | Status: DC

## 2016-07-07 NOTE — Patient Instructions (Signed)
Your physician wants you to follow-up in: [place] DR. BRANCH  You will receive a reminder letter in the mail two months in advance. If you don't receive a letter, please call our office to schedule the follow-up appointment.  Your physician has recommended you make the following change in your medication:   CHANGE COREG 3.125 MG TWICE DAILY  HOLD LISINOPRIL UNTIL Friday AND CALL us WITH AN UPDATE  Thank you for choosing [facility]!!

## 2016-07-07 NOTE — Progress Notes (Signed)
Clinical Summary Nicolas Berry is a 70 y.o.male seen today for follow up, this is our first visit together.  1. Afib/PAF - previously seen in afib clinic - rate control with coreg, eliquis for stroke prevention. - no recent palpitations  2. CAD - incidental finding on recent CT PE - 04/2016 nuclear stress test no ischemia - occasional atypical chest pain.   3. Esophageal thickening - incidental finding on CT scan  4. COPD - emphsymatous changes noted on recent CT PE - followed by pcp  5. Dizziness - reports dizziness, can occur with lying, sitting or standing. Thinks worst after taking lisinopril.  - reports adequate hydration   SH: daughter just diagnosed with cancer.  Past Medical History:  Diagnosis Date  . Anxiety    OA multiple joints  . Arthritis   . BPH (benign prostatic hyperplasia)   . COPD (chronic obstructive pulmonary disease) ([place])   . DDD (degenerative disc disease), lumbar   . Diabetes mellitus without complication ([place])   . Fatty liver   . GERD (gastroesophageal reflux disease)   . Hyperlipidemia   . Hypertension      No Known Allergies   Current Outpatient Prescriptions  Medication Sig Dispense Refill  . acarbose (PRECOSE) 50 MG tablet Take 25 mg by mouth daily.    Marland Kitchen albuterol (PROVENTIL HFA;VENTOLIN HFA) 108 (90 BASE) MCG/ACT inhaler Inhale 2 puffs into the lungs every 4 (four) hours as needed.    Marland Kitchen albuterol (PROVENTIL HFA;VENTOLIN HFA) 108 (90 Base) MCG/ACT inhaler Inhale 2 puffs into the lungs every 4 (four) hours as needed for wheezing or shortness of breath. 1 Inhaler 1  . apixaban (ELIQUIS) 5 MG TABS tablet Take 1 tablet (5 mg total) by mouth 2 (two) times daily. 60 tablet 1  . budesonide-formoterol (SYMBICORT) 160-4.5 MCG/ACT inhaler Inhale 2 puffs into the lungs 2 (two) times daily. dOSE UNKNOWN    . carvedilol (COREG) 3.125 MG tablet Take 1 tablet (3.125 mg total) by mouth 2 (two) times daily with a meal. 60 tablet 1  . clonazePAM  (KLONOPIN) 0.5 MG tablet Take 0.5 mg by mouth 2 (two) times daily as needed.    . finasteride (PROSCAR) 5 MG tablet Take 5 mg by mouth daily.    Marland Kitchen HYDROcodone-acetaminophen (NORCO/VICODIN) 5-325 MG per tablet Take 1 tablet by mouth every 6 (six) hours as needed.     Marland Kitchen lisinopril (PRINIVIL,ZESTRIL) 40 MG tablet Take 40 mg by mouth daily.    . metFORMIN (GLUCOPHAGE) 1000 MG tablet Take 1,000 mg by mouth 2 (two) times daily.    . simvastatin (ZOCOR) 80 MG tablet Take 40 mg by mouth at bedtime.    . traZODone (DESYREL) 100 MG tablet Take 100 mg by mouth at bedtime.    . Vitamin D, Ergocalciferol, (DRISDOL) 50000 UNITS CAPS Take 50,000 Units by mouth every 30 (thirty) days.     No current facility-administered medications for this visit.      Past Surgical History:  Procedure Laterality Date  . BACK SURGERY    . HERNIA REPAIR       No Known Allergies    Family History  Problem Relation Age of Onset  . Cancer Mother     bladder  . Diabetes Father   . Cancer Sister     breast      Social History [name] reports that he has quit smoking. His smoking use included Cigarettes. He smoked 2.00 packs per day. He has never used  smokeless tobacco. [name] reports that he does not drink alcohol.   Review of Systems CONSTITUTIONAL: No weight loss, fever, chills, weakness or fatigue.  HEENT: Eyes: No visual loss, blurred vision, double vision or yellow sclerae.No hearing loss, sneezing, congestion, runny nose or sore throat.  SKIN: No rash or itching.  CARDIOVASCULAR: per HPI RESPIRATORY: No shortness of breath, cough or sputum.  GASTROINTESTINAL: No anorexia, nausea, vomiting or diarrhea. No abdominal pain or blood.  GENITOURINARY: No burning on urination, no polyuria NEUROLOGICAL:per HPI MUSCULOSKELETAL: No muscle, back pain, joint pain or stiffness.  LYMPHATICS: No enlarged nodes. No history of splenectomy.  PSYCHIATRIC: No history of depression or anxiety.    ENDOCRINOLOGIC: No reports of sweating, cold or heat intolerance. No polyuria or polydipsia.  Marland Kitchen   Physical Examination Vitals:   07/07/16 0923  BP: 105/65  Pulse: 76   Vitals:   07/07/16 0923  Weight: 151 lb 6.4 oz (68.7 kg)  Height: 5\' 10"  (1.778 m)    Gen: resting comfortably, no acute distress HEENT: no scleral icterus, pupils equal round and reactive, no palptable cervical adenopathy,  CV: RRR, 2/6 systolic murmur apex, no jvd Resp: Clear to auscultation bilaterally GI: abdomen is soft, non-tender, non-distended, normal bowel sounds, no hepatosplenomegaly MSK: extremities are warm, no edema.  Skin: warm, no rash Neuro:  no focal deficits Psych: appropriate affect   Diagnostic Studies  04/2016 echo Study Conclusions  - Left ventricle: Turbulent flow through LVOT. Systolic anterior   motio of MV leaflets Peak gradient through LV/LVOT /AV is 15 mm   Hg at rest No signifiant obstruction at rest. The cavity size was   normal. Wall thickness was increased in a pattern of mild LVH.   Systolic function was vigorous. The estimated ejection fraction   was in the range of 65% to 70%. Doppler parameters are consistent   with abnormal left ventricular relaxation (grade 1 diastolic   dysfunction). - Aortic valve: AV is thickened, calcified with minimally   restricted motion. - Mitral valve: SAM of mitral valve leaflets. Calcified annulus.   Mildly thickened leaflets . - Left atrium: The atrium was mildly dilated. - Pulmonary arteries: PA peak pressure: 32 mm Hg (S).  04/2016 CT PE IMPRESSION: 1. No evidence of acute pulmonary embolism. 2. Mild cardiomegaly. Left ventricular hypertrophy. Mild aortic valvular calcification. 3. Moderate three-vessel coronary atherosclerosis and mild aortic atherosclerosis. 4. Mild circumferential wall thickening involving the upper thoracic esophagus and of the distal esophagus. Query esophagitis as no distinct mass is identified. Upper  endoscopy may be confirmatory. 5. COPD/emphysema.  No acute cardiopulmonary disease.    04/2016 Nuclear stress  There was no ST segment deviation noted during stress.  No T wave inversion was noted during stress.  The study is normal.  This is a low risk study.  The left ventricular ejection fraction is hyperdynamic (>65%).  Assessment and Plan  1. Afib - no current symptoms, rate controlled - CHADS2Vasc score is 3, continue anticoagulation  2. CAD - noted on CT scan, nuclear stress test shows no significant ischemia - continue risk factor modification  3. Esophageal thickening - given CT scan report to give to his pcp at [facility]  4.HTN - at goal - recent dizziness, will try holding lisionpril for a few days to see if symptoms improve. If so, consider lower dose lisinopril   F/u 6 months      Arnoldo Lenis, M.D.

## 2016-07-13 ENCOUNTER — Telehealth: Payer: Self-pay | Admitting: Cardiology

## 2016-07-13 NOTE — Telephone Encounter (Signed)
From out last visit he was going to hold his lisionpril due to dizzienss and see if symptoms improved, not cough. Perhaps the [place] had him hold for that reason. Is his dizziness any better? His inhalers are managed by his pcp, but typically symbicort should be taken every day and then albuterol just as needed  Zandra Abts MD

## 2016-07-13 NOTE — Telephone Encounter (Signed)
Pt aware - says dizziness has not improved since holding lisinopril (cough was an error should have been dizziness on previous message)

## 2016-07-13 NOTE — Telephone Encounter (Signed)
Patient unsure which medication he was to stop

## 2016-07-13 NOTE — Telephone Encounter (Signed)
Im not sure what is causing his dizziness, it is not heart related. He can restart his lisinopril if symptoms are not improved off it. Needs to f/u with pcp  Zandra Abts MD

## 2016-07-13 NOTE — Telephone Encounter (Signed)
Pt has f/u with [place] [facility] in a week regarding BP and lisinopril

## 2016-07-13 NOTE — Telephone Encounter (Signed)
Pt still holding lisinopril but his hasn't help with cough. [place] [facility] has pt stopping lisinopril and monitoring BP for 1 week. Pt also wants to know if he should be using both albuterol and Symbicort inhalers - thinks he mentioned this to Dr. Harl Bowie at [facility] - will forward to dr. Harl Bowie

## 2016-07-31 DIAGNOSIS — F17200 Nicotine dependence, unspecified, uncomplicated: Secondary | ICD-10-CM | POA: Diagnosis not present

## 2016-07-31 DIAGNOSIS — Z6821 Body mass index (BMI) 21.0-21.9, adult: Secondary | ICD-10-CM | POA: Diagnosis not present

## 2016-07-31 DIAGNOSIS — Z299 Encounter for prophylactic measures, unspecified: Secondary | ICD-10-CM | POA: Diagnosis not present

## 2016-07-31 DIAGNOSIS — H8309 Labyrinthitis, unspecified ear: Secondary | ICD-10-CM | POA: Diagnosis not present

## 2016-07-31 DIAGNOSIS — Z720 Tobacco use: Secondary | ICD-10-CM | POA: Diagnosis not present

## 2016-07-31 DIAGNOSIS — I4891 Unspecified atrial fibrillation: Secondary | ICD-10-CM | POA: Diagnosis not present

## 2016-07-31 DIAGNOSIS — E1122 Type 2 diabetes mellitus with diabetic chronic kidney disease: Secondary | ICD-10-CM | POA: Diagnosis not present

## 2016-07-31 DIAGNOSIS — J069 Acute upper respiratory infection, unspecified: Secondary | ICD-10-CM | POA: Diagnosis not present

## 2017-06-04 ENCOUNTER — Ambulatory Visit: Payer: Medicare Other | Admitting: Cardiology

## 2017-06-25 DIAGNOSIS — Z299 Encounter for prophylactic measures, unspecified: Secondary | ICD-10-CM | POA: Diagnosis not present

## 2017-06-25 DIAGNOSIS — I10 Essential (primary) hypertension: Secondary | ICD-10-CM | POA: Diagnosis not present

## 2017-06-25 DIAGNOSIS — I4891 Unspecified atrial fibrillation: Secondary | ICD-10-CM | POA: Diagnosis not present

## 2017-06-25 DIAGNOSIS — N183 Chronic kidney disease, stage 3 (moderate): Secondary | ICD-10-CM | POA: Diagnosis not present

## 2017-06-25 DIAGNOSIS — E1165 Type 2 diabetes mellitus with hyperglycemia: Secondary | ICD-10-CM | POA: Diagnosis not present

## 2017-06-25 DIAGNOSIS — R42 Dizziness and giddiness: Secondary | ICD-10-CM | POA: Diagnosis not present

## 2017-06-25 DIAGNOSIS — E1122 Type 2 diabetes mellitus with diabetic chronic kidney disease: Secondary | ICD-10-CM | POA: Diagnosis not present

## 2017-07-07 ENCOUNTER — Encounter: Payer: Self-pay | Admitting: Cardiology

## 2017-07-07 ENCOUNTER — Ambulatory Visit (INDEPENDENT_AMBULATORY_CARE_PROVIDER_SITE_OTHER): Payer: Medicare Other | Admitting: Cardiology

## 2017-07-07 VITALS — BP 140/78 | HR 67 | Ht 70.0 in | Wt 155.8 lb

## 2017-07-07 DIAGNOSIS — I4891 Unspecified atrial fibrillation: Secondary | ICD-10-CM | POA: Diagnosis not present

## 2017-07-07 DIAGNOSIS — I2510 Atherosclerotic heart disease of native coronary artery without angina pectoris: Secondary | ICD-10-CM

## 2017-07-07 DIAGNOSIS — I10 Essential (primary) hypertension: Secondary | ICD-10-CM | POA: Diagnosis not present

## 2017-07-07 MED ORDER — BUDESONIDE-FORMOTEROL FUMARATE 160-4.5 MCG/ACT IN AERO: 2.0000 | INHALATION_SPRAY | Freq: Two times a day (BID) | RESPIRATORY_TRACT | 3 refills | Status: DC

## 2017-07-07 NOTE — Progress Notes (Signed)
Clinical Summary Nicolas Berry is a 71 y.o.male seen today for follow up of the following medical problems.   1. Afib/PAF - previously seen in afib clinic - rate control with coreg, eliquis for stroke prevention.  - no recent palpitations  2. CAD - incidental finding on recent CT PE - 04/2016 nuclear stress test no ischemia  - has occasional atypical chest pain only. Typically starts in his lower abdomen, and can radiate up under left ribcage  3. Esophageal thickening - incidental finding on CT scan - followed by pcp  4. COPD - emphsymatous changes noted on recent CT PE - followed by pcp  5. Dizziness - reports dizziness, can occur with lying, sitting or standing. Thinks worst after taking lisinopril.  - reports adequate hydration  - plan for brain MRI tomorrow by pcp  6. Abdominal pain - abdominal pain LLQ radiating under rib cage, sometimes can radiate to chest.  +constiptation.  - he plans to discuss with pcp   SH: daughter recently passed away from cancer.    Past Medical History:  Diagnosis Date  . Anxiety    OA multiple joints  . Arthritis   . BPH (benign prostatic hyperplasia)   . COPD (chronic obstructive pulmonary disease) ([place])   . DDD (degenerative disc disease), lumbar   . Diabetes mellitus without complication ([place])   . Fatty liver   . GERD (gastroesophageal reflux disease)   . Hyperlipidemia   . Hypertension      No Known Allergies   Current Outpatient Prescriptions  Medication Sig Dispense Refill  . acarbose (PRECOSE) 50 MG tablet Take 25 mg by mouth daily.    Marland Kitchen albuterol (PROVENTIL HFA;VENTOLIN HFA) 108 (90 Base) MCG/ACT inhaler Inhale 2 puffs into the lungs every 4 (four) hours as needed for wheezing or shortness of breath. 1 Inhaler 1  . apixaban (ELIQUIS) 5 MG TABS tablet Take 1 tablet (5 mg total) by mouth 2 (two) times daily. 60 tablet 1  . budesonide-formoterol (SYMBICORT) 160-4.5 MCG/ACT inhaler Inhale 2 puffs into the  lungs 2 (two) times daily. As needed    . carvedilol (COREG) 3.125 MG tablet Take 1 tablet (3.125 mg total) by mouth 2 (two) times daily with a meal. 60 tablet 6  . finasteride (PROSCAR) 5 MG tablet Take 5 mg by mouth daily.    Marland Kitchen HYDROcodone-acetaminophen (NORCO) 7.5-325 MG tablet Take 1 tablet by mouth every 8 (eight) hours as needed for moderate pain.    Marland Kitchen lisinopril (PRINIVIL,ZESTRIL) 40 MG tablet Take 40 mg by mouth daily.    . metFORMIN (GLUCOPHAGE) 1000 MG tablet Take 1,000 mg by mouth 2 (two) times daily.    . simvastatin (ZOCOR) 80 MG tablet Take 40 mg by mouth at bedtime.    . traZODone (DESYREL) 100 MG tablet Take 100 mg by mouth at bedtime.    . Vitamin D, Ergocalciferol, (DRISDOL) 50000 UNITS CAPS Take 50,000 Units by mouth every 30 (thirty) days.     No current facility-administered medications for this visit.      Past Surgical History:  Procedure Laterality Date  . BACK SURGERY    . HERNIA REPAIR       No Known Allergies    Family History  Problem Relation Age of Onset  . Cancer Mother        bladder  . Diabetes Father   . Cancer Sister        breast      Social History Nicolas Berry  reports that he has quit smoking. His smoking use included Cigarettes. He smoked 2.00 packs per day. He has never used smokeless tobacco. Nicolas Berry reports that he does not drink alcohol.   Review of Systems CONSTITUTIONAL: No weight loss, fever, chills, weakness or fatigue.  HEENT: Eyes: No visual loss, blurred vision, double vision or yellow sclerae.No hearing loss, sneezing, congestion, runny nose or sore throat.  SKIN: No rash or itching.  CARDIOVASCULAR: per hpi RESPIRATORY:per hpi GASTROINTESTINAL: per hpi GENITOURINARY: No burning on urination, no polyuria NEUROLOGICAL: No headache, dizziness, syncope, paralysis, ataxia, numbness or tingling in the extremities. No change in bowel or bladder control.  MUSCULOSKELETAL: No muscle, back pain, joint pain or stiffness.    LYMPHATICS: No enlarged nodes. No history of splenectomy.  PSYCHIATRIC: No history of depression or anxiety.  ENDOCRINOLOGIC: No reports of sweating, cold or heat intolerance. No polyuria or polydipsia.  Marland Kitchen   Physical Examination Vitals:   07/07/17 1621  BP: 140/78  Pulse: 67  SpO2: 96%   Vitals:   07/07/17 1621  Weight: 155 lb 12.8 oz (70.7 kg)  Height: 5\' 10"  (1.778 m)    Gen: resting comfortably, no acute distress HEENT: no scleral icterus, pupils equal round and reactive, no palptable cervical adenopathy,  CV: RRR, no m/r/g, no jvd Resp: Clear to auscultation bilaterally GI: abdomen is soft, non-tender, non-distended, normal bowel sounds, no hepatosplenomegaly MSK: extremities are warm, no edema.  Skin: warm, no rash Neuro:  no focal deficits Psych: appropriate affect   Diagnostic Studies 04/2016 echo Study Conclusions  - Left ventricle: Turbulent flow through LVOT. Systolic anterior motio of MV leaflets Peak gradient through LV/LVOT /AV is 15 mm Hg at rest No signifiant obstruction at rest. The cavity size was normal. Wall thickness was increased in a pattern of mild LVH. Systolic function was vigorous. The estimated ejection fraction was in the range of 65% to 70%. Doppler parameters are consistent with abnormal left ventricular relaxation (grade 1 diastolic dysfunction). - Aortic valve: AV is thickened, calcified with minimally restricted motion. - Mitral valve: SAM of mitral valve leaflets. Calcified annulus. Mildly thickened leaflets . - Left atrium: The atrium was mildly dilated. - Pulmonary arteries: PA peak pressure: 32 mm Hg (S).  04/2016 CT PE IMPRESSION: 1. No evidence of acute pulmonary embolism. 2. Mild cardiomegaly. Left ventricular hypertrophy. Mild aortic valvular calcification. 3. Moderate three-vessel coronary atherosclerosis and mild aortic atherosclerosis. 4. Mild circumferential wall thickening involving the upper  thoracic esophagus and of the distal esophagus. Query esophagitis as no distinct mass is identified. Upper endoscopy may be confirmatory. 5. COPD/emphysema. No acute cardiopulmonary disease.    04/2016 Nuclear stress  There was no ST segment deviation noted during stress.  No T wave inversion was noted during stress.  The study is normal.  This is a low risk study.  The left ventricular ejection fraction is hyperdynamic (>65%).     Assessment and Plan  1. Afib - CHADS2Vasc score is 3, continue anticoagulation - no recent symptoms, continue current meds - EKG in clinic shows NSR  2. CAD - noted on CT scan, nuclear stress test shows no significant ischemia - recent atypical chest pain symptoms noncardiac in description, continue to monitor  3.HTN -have tolerated higher bp's due to ongoing dizziness of unclear etiology. Continue current meds for now   F/u 6 months         Arnoldo Lenis, M.D.

## 2017-07-07 NOTE — Patient Instructions (Signed)
Medication Instructions:  Your physician recommends that you continue on your current medications as directed. Please refer to the Current Medication list given to you today.   Labwork: I WILL REQUEST LABS FROM [facility]   Testing/Procedures: NONE  Follow-Up: Your physician wants you to follow-up in: 6 MONTHS .  You will receive a reminder letter in the mail two months in advance. If you don't receive a letter, please call our office to schedule the follow-up appointment.   Any Other Special Instructions Will Be Listed Below (If Applicable).     If you need a refill on your cardiac medications before your next appointment, please call your pharmacy.

## 2017-07-08 DIAGNOSIS — I6782 Cerebral ischemia: Secondary | ICD-10-CM | POA: Diagnosis not present

## 2017-07-08 DIAGNOSIS — R42 Dizziness and giddiness: Secondary | ICD-10-CM | POA: Diagnosis not present

## 2017-07-08 DIAGNOSIS — J329 Chronic sinusitis, unspecified: Secondary | ICD-10-CM | POA: Diagnosis not present

## 2017-07-28 DIAGNOSIS — Z6823 Body mass index (BMI) 23.0-23.9, adult: Secondary | ICD-10-CM | POA: Diagnosis not present

## 2017-07-28 DIAGNOSIS — I10 Essential (primary) hypertension: Secondary | ICD-10-CM | POA: Diagnosis not present

## 2017-07-28 DIAGNOSIS — E1165 Type 2 diabetes mellitus with hyperglycemia: Secondary | ICD-10-CM | POA: Diagnosis not present

## 2017-07-28 DIAGNOSIS — F419 Anxiety disorder, unspecified: Secondary | ICD-10-CM | POA: Diagnosis not present

## 2017-07-28 DIAGNOSIS — Z299 Encounter for prophylactic measures, unspecified: Secondary | ICD-10-CM | POA: Diagnosis not present

## 2017-07-28 DIAGNOSIS — R42 Dizziness and giddiness: Secondary | ICD-10-CM | POA: Diagnosis not present

## 2017-09-28 DIAGNOSIS — Z299 Encounter for prophylactic measures, unspecified: Secondary | ICD-10-CM | POA: Diagnosis not present

## 2017-09-28 DIAGNOSIS — I10 Essential (primary) hypertension: Secondary | ICD-10-CM | POA: Diagnosis not present

## 2017-09-28 DIAGNOSIS — E1122 Type 2 diabetes mellitus with diabetic chronic kidney disease: Secondary | ICD-10-CM | POA: Diagnosis not present

## 2017-09-28 DIAGNOSIS — E1165 Type 2 diabetes mellitus with hyperglycemia: Secondary | ICD-10-CM | POA: Diagnosis not present

## 2017-09-28 DIAGNOSIS — R809 Proteinuria, unspecified: Secondary | ICD-10-CM | POA: Diagnosis not present

## 2017-09-28 DIAGNOSIS — M79604 Pain in right leg: Secondary | ICD-10-CM | POA: Diagnosis not present

## 2017-09-28 DIAGNOSIS — E1129 Type 2 diabetes mellitus with other diabetic kidney complication: Secondary | ICD-10-CM | POA: Diagnosis not present

## 2017-09-28 DIAGNOSIS — Z6824 Body mass index (BMI) 24.0-24.9, adult: Secondary | ICD-10-CM | POA: Diagnosis not present

## 2017-09-28 DIAGNOSIS — N183 Chronic kidney disease, stage 3 (moderate): Secondary | ICD-10-CM | POA: Diagnosis not present

## 2017-09-28 DIAGNOSIS — I4891 Unspecified atrial fibrillation: Secondary | ICD-10-CM | POA: Diagnosis not present

## 2017-09-28 DIAGNOSIS — F17210 Nicotine dependence, cigarettes, uncomplicated: Secondary | ICD-10-CM | POA: Diagnosis not present

## 2017-10-25 DIAGNOSIS — Z6824 Body mass index (BMI) 24.0-24.9, adult: Secondary | ICD-10-CM | POA: Diagnosis not present

## 2017-10-25 DIAGNOSIS — I4891 Unspecified atrial fibrillation: Secondary | ICD-10-CM | POA: Diagnosis not present

## 2017-10-25 DIAGNOSIS — N183 Chronic kidney disease, stage 3 (moderate): Secondary | ICD-10-CM | POA: Diagnosis not present

## 2017-10-25 DIAGNOSIS — M5430 Sciatica, unspecified side: Secondary | ICD-10-CM | POA: Diagnosis not present

## 2017-10-25 DIAGNOSIS — E1122 Type 2 diabetes mellitus with diabetic chronic kidney disease: Secondary | ICD-10-CM | POA: Diagnosis not present

## 2017-10-25 DIAGNOSIS — I10 Essential (primary) hypertension: Secondary | ICD-10-CM | POA: Diagnosis not present

## 2017-10-25 DIAGNOSIS — Z299 Encounter for prophylactic measures, unspecified: Secondary | ICD-10-CM | POA: Diagnosis not present

## 2017-10-25 DIAGNOSIS — E7800 Pure hypercholesterolemia, unspecified: Secondary | ICD-10-CM | POA: Diagnosis not present

## 2017-12-02 DIAGNOSIS — M5431 Sciatica, right side: Secondary | ICD-10-CM | POA: Diagnosis not present

## 2017-12-02 DIAGNOSIS — M9904 Segmental and somatic dysfunction of sacral region: Secondary | ICD-10-CM | POA: Diagnosis not present

## 2018-01-31 ENCOUNTER — Encounter: Payer: Self-pay | Admitting: Cardiology

## 2018-01-31 ENCOUNTER — Ambulatory Visit (INDEPENDENT_AMBULATORY_CARE_PROVIDER_SITE_OTHER): Payer: Medicare Other | Admitting: Cardiology

## 2018-01-31 VITALS — BP 128/72 | HR 79 | Ht 70.0 in | Wt 164.0 lb

## 2018-01-31 DIAGNOSIS — I4891 Unspecified atrial fibrillation: Secondary | ICD-10-CM | POA: Diagnosis not present

## 2018-01-31 DIAGNOSIS — I10 Essential (primary) hypertension: Secondary | ICD-10-CM | POA: Diagnosis not present

## 2018-01-31 DIAGNOSIS — M79605 Pain in left leg: Secondary | ICD-10-CM

## 2018-01-31 DIAGNOSIS — M79604 Pain in right leg: Secondary | ICD-10-CM | POA: Diagnosis not present

## 2018-01-31 DIAGNOSIS — I2510 Atherosclerotic heart disease of native coronary artery without angina pectoris: Secondary | ICD-10-CM | POA: Diagnosis not present

## 2018-01-31 MED ORDER — LISINOPRIL 20 MG PO TABS: 20.0000 mg | ORAL_TABLET | Freq: Every day | ORAL | 3 refills | Status: DC

## 2018-01-31 NOTE — Progress Notes (Signed)
Clinical Summary Nicolas Berry is a 72 y.o.male seen today for follow up of the following medical problems.   1. Afib/PAF - previously seenin afib clinic - rate control with coreg, eliquis for stroke prevention.  - no recent palpitations.   2. CAD - incidental finding on recent CT PE - 04/2016 nuclear stress test no ischemia  - occasional atypical chest pain, left lower rib cage into axilla. No exertional symptoms.   3. Esophageal thickening - incidental finding on CT scan - followed by pcp  4. COPD - emphsymatous changes noted on recent CT PE - followed by pcp  5. Dizziness  - [facility]  - coreg lowered to 3.174m in AM and 6.25mg  in PM by other provider.    6. Leg pains - occurs at rest. Occurs in thicks and calves.  - can be worst with walking.      Past Medical History:  Diagnosis Date  . Anxiety    OA multiple joints  . Arthritis   . BPH (benign prostatic hyperplasia)   . COPD (chronic obstructive pulmonary disease) ([place])   . DDD (degenerative disc disease), lumbar   . Diabetes mellitus without complication ([place])   . Fatty liver   . GERD (gastroesophageal reflux disease)   . Hyperlipidemia   . Hypertension      No Known Allergies   Current Outpatient Medications  Medication Sig Dispense Refill  . acarbose (PRECOSE) 50 MG tablet Take 25 mg by mouth daily.    Marland Kitchen apixaban (ELIQUIS) 5 MG TABS tablet Take 1 tablet (5 mg total) by mouth 2 (two) times daily. 60 tablet 1  . budesonide-formoterol (SYMBICORT) 160-4.5 MCG/ACT inhaler Inhale 2 puffs into the lungs 2 (two) times daily. As needed 1 Inhaler 3  . carvedilol (COREG) 3.125 MG tablet Take 1 tablet (3.125 mg total) by mouth 2 (two) times daily with a meal. 60 tablet 6  . finasteride (PROSCAR) 5 MG tablet Take 5 mg by mouth daily.    Marland Kitchen HYDROcodone-acetaminophen (NORCO) 7.5-325 MG tablet Take 1 tablet by mouth every 8 (eight) hours as needed for moderate pain.    Marland Kitchen  lisinopril (PRINIVIL,ZESTRIL) 40 MG tablet Take 40 mg by mouth daily.    . metFORMIN (GLUCOPHAGE) 1000 MG tablet Take 500 mg by mouth daily with breakfast.     . simvastatin (ZOCOR) 80 MG tablet Take 40 mg by mouth at bedtime.    . traZODone (DESYREL) 100 MG tablet Take 200 mg by mouth at bedtime.     . Vitamin D, Ergocalciferol, (DRISDOL) 50000 UNITS CAPS Take 50,000 Units by mouth every 30 (thirty) days.     No current facility-administered medications for this visit.      Past Surgical History:  Procedure Laterality Date  . BACK SURGERY    . HERNIA REPAIR       No Known Allergies    Family History  Problem Relation Age of Onset  . Cancer Mother        bladder  . Diabetes Father   . Cancer Sister        breast      Social History Nicolas Berry reports that he has been smoking cigarettes.  He started smoking about 57 years ago. He has been smoking about 2.00 packs per day. He has never used smokeless tobacco. Nicolas Berry reports that he does not drink alcohol.   Review of Systems CONSTITUTIONAL: No weight loss, fever, chills, weakness or fatigue.  HEENT: Eyes: No visual loss, blurred vision, double vision or yellow sclerae.No hearing loss, sneezing, congestion, runny nose or sore throat.  SKIN: No rash or itching.  CARDIOVASCULAR: per hpi RESPIRATORY: No shortness of breath, cough or sputum.  GASTROINTESTINAL: No anorexia, nausea, vomiting or diarrhea. No abdominal pain or blood.  GENITOURINARY: No burning on urination, no polyuria NEUROLOGICAL: No headache, dizziness, syncope, paralysis, ataxia, numbness or tingling in the extremities. No change in bowel or bladder control.  MUSCULOSKELETAL: per hpi  LYMPHATICS: No enlarged nodes. No history of splenectomy.  PSYCHIATRIC: No history of depression or anxiety.  ENDOCRINOLOGIC: No reports of sweating, cold or heat intolerance. No polyuria or polydipsia.  Marland Kitchen   Physical Examination Vitals:   01/31/18 1515  BP: 128/72   Pulse: 79  SpO2: 96%   Vitals:   01/31/18 1515  Weight: 164 lb (74.4 kg)  Height: 5\' 10"  (1.778 m)    Gen: resting comfortably, no acute distress HEENT: no scleral icterus, pupils equal round and reactive, no palptable cervical adenopathy,  CV: RRR, no m/r/g, no jvd Resp: Clear to auscultation bilaterally GI: abdomen is soft, non-tender, non-distended, normal bowel sounds, no hepatosplenomegaly MSK: extremities are warm, no edema.  Skin: warm, no rash Neuro:  no focal deficits Psych: appropriate affect   Diagnostic Studies 04/2016 echo Study Conclusions  - Left ventricle: Turbulent flow through LVOT. Systolic anterior motio of MV leaflets Peak gradient through LV/LVOT /AV is 15 mm Hg at rest No signifiant obstruction at rest. The cavity size was normal. Wall thickness was increased in a pattern of mild LVH. Systolic function was vigorous. The estimated ejection fraction was in the range of 65% to 70%. Doppler parameters are consistent with abnormal left ventricular relaxation (grade 1 diastolic dysfunction). - Aortic valve: AV is thickened, calcified with minimally restricted motion. - Mitral valve: SAM of mitral valve leaflets. Calcified annulus. Mildly thickened leaflets . - Left atrium: The atrium was mildly dilated. - Pulmonary arteries: PA peak pressure: 32 mm Hg (S).  04/2016 CT PE IMPRESSION: 1. No evidence of acute pulmonary embolism. 2. Mild cardiomegaly. Left ventricular hypertrophy. Mild aortic valvular calcification. 3. Moderate three-vessel coronary atherosclerosis and mild aortic atherosclerosis. 4. Mild circumferential wall thickening involving the upper thoracic esophagus and of the distal esophagus. Query esophagitis as no distinct mass is identified. Upper endoscopy may be confirmatory. 5. COPD/emphysema. No acute cardiopulmonary disease.    04/2016 Nuclear stress  There was no ST segment deviation noted during  stress.  No T wave inversion was noted during stress.  The study is normal.  This is a low risk study.  The left ventricular ejection fraction is hyperdynamic (>65%).     Assessment and Plan  1. Afib - CHADS2Vasc score is 3, continue anticoagulation - doing well, continue current meds  2. CAD - noted on CT scan, nuclear stress test shows no significant ischemia - atypical chest pain unchanged, continue to monitor atthis time.   3.HTN -have tolerated higher bp's due to ongoing dizziness of unclear etiology.  - continue current meds   4. Leg pains - check ABIs   F/u 6 months       Nicolas Berry, M.D.

## 2018-01-31 NOTE — Patient Instructions (Signed)
Medication Instructions:  Your physician recommends that you continue on your current medications as directed. Please refer to the Current Medication list given to you today.   Labwork: none  Testing/Procedures: Your physician has requested that you have an ankle brachial index (ABI). During this test an ultrasound and blood pressure cuff are used to evaluate the arteries that supply the arms and legs with blood. Allow thirty minutes for this exam. There are no restrictions or special instructions.    Follow-Up: Your physician wants you to follow-up in: 6 months.  You will receive a reminder letter in the mail two months in advance. If you don't receive a letter, please call our office to schedule the follow-up appointment.   Any Other Special Instructions Will Be Listed Below (If Applicable).     If you need a refill on your cardiac medications before your next appointment, please call your pharmacy.   

## 2018-02-06 ENCOUNTER — Encounter: Payer: Self-pay | Admitting: Cardiology

## 2018-02-11 ENCOUNTER — Ambulatory Visit (HOSPITAL_COMMUNITY)
Admission: RE | Admit: 2018-02-11 | Discharge: 2018-02-11 | Disposition: A | Discharge status: Home / Self Care | Payer: Medicare Other | Source: Ambulatory Visit | Attending: Cardiology | Admitting: Cardiology

## 2018-02-11 DIAGNOSIS — M79604 Pain in right leg: Secondary | ICD-10-CM | POA: Insufficient documentation

## 2018-02-11 DIAGNOSIS — M79605 Pain in left leg: Secondary | ICD-10-CM | POA: Diagnosis not present

## 2018-02-15 ENCOUNTER — Telehealth: Payer: Self-pay

## 2018-02-15 NOTE — Telephone Encounter (Signed)
Called pt. No answer, left message for pt to return call.  

## 2018-02-15 NOTE — Telephone Encounter (Signed)
-----   Message from [place] sent at 02/11/2018  1:14 PM EDT -----   ----- Message ----- From: Charlie Pitter, PA-C Sent: 02/11/2018   1:12 PM To: Massie Maroon, CMA  Covering Dr. Nelly Laurence box. Test ordered for leg pain, mostly at rest. ABIs are relatively normal. If patient is still having leg pain, please find out how we order post-exercise testing to exclude occult arterial occlusive disease. He should also concomitantly f/u with PCP for evaluation of this. No recent labs on file so may need eval of electrolytes or discuss alternative statin trial as it does not look like we follow his cholesterol. Dayna Dunn PA-C

## 2018-02-16 NOTE — Telephone Encounter (Signed)
Patient returned call

## 2018-02-17 ENCOUNTER — Telehealth: Payer: Self-pay

## 2018-02-17 ENCOUNTER — Other Ambulatory Visit: Payer: Self-pay

## 2018-02-17 DIAGNOSIS — M79604 Pain in right leg: Secondary | ICD-10-CM

## 2018-02-17 DIAGNOSIS — M79605 Pain in left leg: Secondary | ICD-10-CM

## 2018-02-17 NOTE — Telephone Encounter (Signed)
Returned pt. Call. No answer, left message for pt to return call.

## 2018-02-17 NOTE — Telephone Encounter (Signed)
-----   Message from [place] sent at 02/11/2018  1:14 PM EDT -----   ----- Message ----- From: Charlie Pitter, PA-C Sent: 02/11/2018   1:12 PM To: Massie Maroon, CMA  Covering Dr. Nelly Laurence box. Test ordered for leg pain, mostly at rest. ABIs are relatively normal. If patient is still having leg pain, please find out how we order post-exercise testing to exclude occult arterial occlusive disease. He should also concomitantly f/u with PCP for evaluation of this. No recent labs on file so may need eval of electrolytes or discuss alternative statin trial as it does not look like we follow his cholesterol. Dayna Dunn PA-C

## 2018-02-18 NOTE — Telephone Encounter (Signed)
-----   Message from [place] sent at 02/11/2018  1:14 PM EDT -----   ----- Message ----- From: Charlie Pitter, PA-C Sent: 02/11/2018   1:12 PM To: Massie Maroon, CMA  Covering Dr. Nelly Laurence box. Test ordered for leg pain, mostly at rest. ABIs are relatively normal. If patient is still having leg pain, please find out how we order post-exercise testing to exclude occult arterial occlusive disease. He should also concomitantly f/u with PCP for evaluation of this. No recent labs on file so may need eval of electrolytes or discuss alternative statin trial as it does not look like we follow his cholesterol. Dayna Dunn PA-C

## 2018-02-22 NOTE — Telephone Encounter (Signed)
Spoke with Rich in Ultrasound, he advised me on how to order in for [place] Multi- seg with exercise. He states that it is a very expensive test.

## 2018-02-22 NOTE — Telephone Encounter (Signed)
-----   Message from [place] sent at 02/11/2018  1:14 PM EDT -----   ----- Message ----- From: Charlie Pitter, PA-C Sent: 02/11/2018   1:12 PM To: Massie Maroon, CMA  Covering Dr. Nelly Laurence box. Test ordered for leg pain, mostly at rest. ABIs are relatively normal. If patient is still having leg pain, please find out how we order post-exercise testing to exclude occult arterial occlusive disease. He should also concomitantly f/u with PCP for evaluation of this. No recent labs on file so may need eval of electrolytes or discuss alternative statin trial as it does not look like we follow his cholesterol. Dayna Dunn PA-C

## 2018-03-01 ENCOUNTER — Ambulatory Visit (HOSPITAL_COMMUNITY): Admission: RE | Admit: 2018-03-01 | Payer: Medicare Other | Source: Ambulatory Visit

## 2018-03-03 ENCOUNTER — Ambulatory Visit (HOSPITAL_COMMUNITY): Admission: RE | Admit: 2018-03-03 | Payer: Medicare Other | Source: Ambulatory Visit

## 2018-03-04 ENCOUNTER — Ambulatory Visit (HOSPITAL_COMMUNITY): Admission: RE | Admit: 2018-03-04 | Payer: Medicare Other | Source: Ambulatory Visit

## 2018-04-08 DIAGNOSIS — Z299 Encounter for prophylactic measures, unspecified: Secondary | ICD-10-CM | POA: Diagnosis not present

## 2018-04-08 DIAGNOSIS — Z6825 Body mass index (BMI) 25.0-25.9, adult: Secondary | ICD-10-CM | POA: Diagnosis not present

## 2018-04-08 DIAGNOSIS — E1165 Type 2 diabetes mellitus with hyperglycemia: Secondary | ICD-10-CM | POA: Diagnosis not present

## 2018-04-08 DIAGNOSIS — M79604 Pain in right leg: Secondary | ICD-10-CM | POA: Diagnosis not present

## 2018-04-08 DIAGNOSIS — R05 Cough: Secondary | ICD-10-CM | POA: Diagnosis not present

## 2018-04-08 DIAGNOSIS — I10 Essential (primary) hypertension: Secondary | ICD-10-CM | POA: Diagnosis not present

## 2018-04-08 DIAGNOSIS — R42 Dizziness and giddiness: Secondary | ICD-10-CM | POA: Diagnosis not present

## 2018-04-28 DIAGNOSIS — Z961 Presence of intraocular lens: Secondary | ICD-10-CM | POA: Diagnosis not present

## 2018-08-01 ENCOUNTER — Ambulatory Visit: Payer: Medicare Other | Admitting: Cardiology

## 2018-08-01 NOTE — Progress Notes (Deleted)
Clinical Summary Nicolas Berry is a 72 y.o.male seen today for follow upof the following medical problems.  1. Afib/PAF - previously seenin afib clinic - rate control with coreg, eliquis for stroke prevention.  - no recent palpitations.   2. CAD - incidental finding on recent CT PE - 04/2016 nuclear stress test no ischemia  - occasional atypical chest pain, left lower rib cage into axilla. No exertional symptoms.   3. Esophageal thickening - incidental finding on CT scan - followed by pcp  4. COPD - emphsymatous changes noted on recent CT PE - followed by pcp  5. Dizziness  - [facility]  - coreg lowered to 3.177m in AM and 6.25mg  in PM by other provider.    6. Leg pains - occurs at rest. Occurs in thicks and calves.  - can be worst with walking.   01/2018 ABIs borderline abnormal.    Past Medical History:  Diagnosis Date  . Anxiety    OA multiple joints  . Arthritis   . BPH (benign prostatic hyperplasia)   . COPD (chronic obstructive pulmonary disease) ([place])   . DDD (degenerative disc disease), lumbar   . Diabetes mellitus without complication ([place])   . Fatty liver   . GERD (gastroesophageal reflux disease)   . Hyperlipidemia   . Hypertension      No Known Allergies   Current Outpatient Medications  Medication Sig Dispense Refill  . acarbose (PRECOSE) 50 MG tablet Take 25 mg by mouth daily.    Marland Kitchen apixaban (ELIQUIS) 5 MG TABS tablet Take 1 tablet (5 mg total) by mouth 2 (two) times daily. 60 tablet 1  . budesonide-formoterol (SYMBICORT) 160-4.5 MCG/ACT inhaler Inhale 2 puffs into the lungs 2 (two) times daily. As needed 1 Inhaler 3  . carvedilol (COREG) 3.125 MG tablet Take 1 tablet (3.125 mg total) by mouth 2 (two) times daily with a meal. (Patient taking differently: Take 3.125 mg by mouth 2 (two) times daily with a meal. ) 60 tablet 6  . finasteride (PROSCAR) 5 MG tablet Take 5 mg by mouth daily.    Marland Kitchen gabapentin  (NEURONTIN) 300 MG capsule Take 300 mg by mouth.   1  . lisinopril (PRINIVIL,ZESTRIL) 20 MG tablet Take 1 tablet (20 mg total) by mouth daily. 90 tablet 3  . metFORMIN (GLUCOPHAGE) 500 MG tablet Take 500 mg by mouth daily with breakfast.    . simvastatin (ZOCOR) 40 MG tablet Take 40 mg by mouth daily.    . traZODone (DESYREL) 100 MG tablet Take 200 mg by mouth at bedtime.     . Vitamin D, Ergocalciferol, (DRISDOL) 50000 UNITS CAPS Take 50,000 Units by mouth every 30 (thirty) days.     No current facility-administered medications for this visit.      Past Surgical History:  Procedure Laterality Date  . BACK SURGERY    . HERNIA REPAIR       No Known Allergies    Family History  Problem Relation Age of Onset  . Cancer Mother        bladder  . Diabetes Father   . Cancer Sister        breast      Social History Nicolas Berry reports that he has been smoking cigarettes. He started smoking about 58 years ago. He has been smoking about 2.00 packs per day. He has never used smokeless tobacco. Nicolas Berry reports that he does not drink alcohol.   Review of  Systems CONSTITUTIONAL: No weight loss, fever, chills, weakness or fatigue.  HEENT: Eyes: No visual loss, blurred vision, double vision or yellow sclerae.No hearing loss, sneezing, congestion, runny nose or sore throat.  SKIN: No rash or itching.  CARDIOVASCULAR:  RESPIRATORY: No shortness of breath, cough or sputum.  GASTROINTESTINAL: No anorexia, nausea, vomiting or diarrhea. No abdominal pain or blood.  GENITOURINARY: No burning on urination, no polyuria NEUROLOGICAL: No headache, dizziness, syncope, paralysis, ataxia, numbness or tingling in the extremities. No change in bowel or bladder control.  MUSCULOSKELETAL: No muscle, back pain, joint pain or stiffness.  LYMPHATICS: No enlarged nodes. No history of splenectomy.  PSYCHIATRIC: No history of depression or anxiety.  ENDOCRINOLOGIC: No reports of sweating, cold or heat  intolerance. No polyuria or polydipsia.  Marland Kitchen   Physical Examination There were no vitals filed for this visit. There were no vitals filed for this visit.  Gen: resting comfortably, no acute distress HEENT: no scleral icterus, pupils equal round and reactive, no palptable cervical adenopathy,  CV Resp: Clear to auscultation bilaterally GI: abdomen is soft, non-tender, non-distended, normal bowel sounds, no hepatosplenomegaly MSK: extremities are warm, no edema.  Skin: warm, no rash Neuro:  no focal deficits Psych: appropriate affect   Diagnostic Studies 04/2016 echo Study Conclusions  - Left ventricle: Turbulent flow through LVOT. Systolic anterior motio of MV leaflets Peak gradient through LV/LVOT /AV is 15 mm Hg at rest No signifiant obstruction at rest. The cavity size was normal. Wall thickness was increased in a pattern of mild LVH. Systolic function was vigorous. The estimated ejection fraction was in the range of 65% to 70%. Doppler parameters are consistent with abnormal left ventricular relaxation (grade 1 diastolic dysfunction). - Aortic valve: AV is thickened, calcified with minimally restricted motion. - Mitral valve: SAM of mitral valve leaflets. Calcified annulus. Mildly thickened leaflets . - Left atrium: The atrium was mildly dilated. - Pulmonary arteries: PA peak pressure: 32 mm Hg (S).  04/2016 CT PE IMPRESSION: 1. No evidence of acute pulmonary embolism. 2. Mild cardiomegaly. Left ventricular hypertrophy. Mild aortic valvular calcification. 3. Moderate three-vessel coronary atherosclerosis and mild aortic atherosclerosis. 4. Mild circumferential wall thickening involving the upper thoracic esophagus and of the distal esophagus. Query esophagitis as no distinct mass is identified. Upper endoscopy may be confirmatory. 5. COPD/emphysema. No acute cardiopulmonary disease.    04/2016 Nuclear stress  There was no ST segment  deviation noted during stress.  No T wave inversion was noted during stress.  The study is normal.  This is a low risk study.  The left ventricular ejection fraction is hyperdynamic (>65%).  01/2018 ABI IMPRESSION: 1. Borderline normal bilateral ABIs. Consider postexercise testing to exclude occult arterial occlusive Disease.    Assessment and Plan   1. Afib - CHADS2Vasc score is 3, continue anticoagulation - doing well, continue current meds  2. CAD - noted on CT scan, nuclear stress test shows no significant ischemia - atypical chest pain unchanged, continue to monitor atthis time.   3.HTN -have tolerated higher bp's due to ongoing dizziness of unclear etiology.  - continue current meds   4. Leg pains - check ABIs      Arnoldo Lenis, M.D.

## 2018-08-02 ENCOUNTER — Ambulatory Visit (INDEPENDENT_AMBULATORY_CARE_PROVIDER_SITE_OTHER): Payer: Medicare Other | Admitting: Cardiology

## 2018-08-02 ENCOUNTER — Encounter: Payer: Self-pay | Admitting: Cardiology

## 2018-08-02 VITALS — BP 140/80 | HR 63 | Ht 70.0 in | Wt 170.2 lb

## 2018-08-02 DIAGNOSIS — I2510 Atherosclerotic heart disease of native coronary artery without angina pectoris: Secondary | ICD-10-CM | POA: Diagnosis not present

## 2018-08-02 DIAGNOSIS — I10 Essential (primary) hypertension: Secondary | ICD-10-CM | POA: Diagnosis not present

## 2018-08-02 DIAGNOSIS — I480 Paroxysmal atrial fibrillation: Secondary | ICD-10-CM

## 2018-08-02 NOTE — Patient Instructions (Signed)

## 2018-08-02 NOTE — Progress Notes (Signed)
Clinical Summary Mr. Taboada is a 72 y.o.male seen today for follow upof the following medical problems.  1. Afib/PAF - previously seenin afib clinic - rate control with coreg, eliquis for stroke prevention.  - No recent palpitations.  - no bleeding eliquis.   2. CAD - incidental finding on recent CT PE - 04/2016 nuclear stress test no ischemia  - occasional atypical chest pain, left lower rib cage into axilla. No exertional symptoms.   - can have some chest pain in the evening. Stabbing pain after eating dinner and snackin., Not taking omepraozle daily  3. Esophageal thickening - incidental finding on CT scan - followed by pcp  4. COPD - emphsymatous changes noted on recent CT PE - followed by pcp  5. Dizziness  - [facility]  - coreg lowered to 3.142m in AM and 6.25mg  in PM by other provider.   - ongoing, unchanged. Feeling of fullness in head. Can occur sitting or standing. Feeling of being off balance. Can occur with turning head.  -    6. Leg pains - occurs at rest. Occurs in thighs and calves.  - can be worst with walking.   01/2018 ABI right 0.98, left 0.92 - symptoms improved since last visit.   SH: works on [employer] course. Past Medical History:  Diagnosis Date  . Anxiety    OA multiple joints  . Arthritis   . BPH (benign prostatic hyperplasia)   . COPD (chronic obstructive pulmonary disease) ([place])   . DDD (degenerative disc disease), lumbar   . Diabetes mellitus without complication ([place])   . Fatty liver   . GERD (gastroesophageal reflux disease)   . Hyperlipidemia   . Hypertension      No Known Allergies   Current Outpatient Medications  Medication Sig Dispense Refill  . acarbose (PRECOSE) 50 MG tablet Take 25 mg by mouth daily.    Marland Kitchen apixaban (ELIQUIS) 5 MG TABS tablet Take 1 tablet (5 mg total) by mouth 2 (two) times daily. 60 tablet 1  . budesonide-formoterol (SYMBICORT) 160-4.5 MCG/ACT inhaler  Inhale 2 puffs into the lungs 2 (two) times daily. As needed 1 Inhaler 3  . carvedilol (COREG) 3.125 MG tablet Take 1 tablet (3.125 mg total) by mouth 2 (two) times daily with a meal. (Patient taking differently: Take 3.125 mg by mouth 2 (two) times daily with a meal. ) 60 tablet 6  . finasteride (PROSCAR) 5 MG tablet Take 5 mg by mouth daily.    Marland Kitchen gabapentin (NEURONTIN) 300 MG capsule Take 300 mg by mouth.   1  . lisinopril (PRINIVIL,ZESTRIL) 20 MG tablet Take 1 tablet (20 mg total) by mouth daily. 90 tablet 3  . metFORMIN (GLUCOPHAGE) 500 MG tablet Take 500 mg by mouth daily with breakfast.    . simvastatin (ZOCOR) 40 MG tablet Take 40 mg by mouth daily.    . traZODone (DESYREL) 100 MG tablet Take 200 mg by mouth at bedtime.     . Vitamin D, Ergocalciferol, (DRISDOL) 50000 UNITS CAPS Take 50,000 Units by mouth every 30 (thirty) days.     No current facility-administered medications for this visit.      Past Surgical History:  Procedure Laterality Date  . BACK SURGERY    . HERNIA REPAIR       No Known Allergies    Family History  Problem Relation Age of Onset  . Cancer Mother        bladder  . Diabetes  Father   . Cancer Sister        breast      Social History Mr. [place] reports that he has been smoking cigarettes. He started smoking about 58 years ago. He has been smoking about 2.00 packs per day. He has never used smokeless tobacco. Mr. [place]hler reports that he does not drink alcohol.   Review of Systems CONSTITUTIONAL: No weight loss, fever, chills, weakness or fatigue.  HEENT: Eyes: No visual loss, blurred vision, double vision or yellow sclerae.No hearing loss, sneezing, congestion, runny nose or sore throat.  SKIN: No rash or itching.  CARDIOVASCULAR: per hpi RESPIRATORY: No shortness of breath, cough or sputum.  GASTROINTESTINAL: No anorexia, nausea, vomiting or diarrhea. No abdominal pain or blood.  GENITOURINARY: No burning on urination, no  polyuria NEUROLOGICAL: No headache, dizziness, syncope, paralysis, ataxia, numbness or tingling in the extremities. No change in bowel or bladder control.  MUSCULOSKELETAL: No muscle, back pain, joint pain or stiffness.  LYMPHATICS: No enlarged nodes. No history of splenectomy.  PSYCHIATRIC: No history of depression or anxiety.  ENDOCRINOLOGIC: No reports of sweating, cold or heat intolerance. No polyuria or polydipsia.  Marland Kitchen   Physical Examination Vitals:   08/02/18 1557  BP: 140/80  Pulse: 63  SpO2: 95%   Vitals:   08/02/18 1557  Weight: 170 lb 3.2 oz (77.2 kg)  Height: 5\' 10"  (1.778 m)    Gen: resting comfortably, no acute distress HEENT: no scleral icterus, pupils equal round and reactive, no palptable cervical adenopathy,  CV: RRR, no mr/g no jvd Resp: Clear to auscultation bilaterally GI: abdomen is soft, non-tender, non-distended, normal bowel sounds, no hepatosplenomegaly MSK: extremities are warm, no edema.  Skin: warm, no rash Neuro:  no focal deficits Psych: appropriate affect   Diagnostic Studies 04/2016 echo Study Conclusions  - Left ventricle: Turbulent flow through LVOT. Systolic anterior motio of MV leaflets Peak gradient through LV/LVOT /AV is 15 mm Hg at rest No signifiant obstruction at rest. The cavity size was normal. Wall thickness was increased in a pattern of mild LVH. Systolic function was vigorous. The estimated ejection fraction was in the range of 65% to 70%. Doppler parameters are consistent with abnormal left ventricular relaxation (grade 1 diastolic dysfunction). - Aortic valve: AV is thickened, calcified with minimally restricted motion. - Mitral valve: SAM of mitral valve leaflets. Calcified annulus. Mildly thickened leaflets . - Left atrium: The atrium was mildly dilated. - Pulmonary arteries: PA peak pressure: 32 mm Hg (S).  04/2016 CT PE IMPRESSION: 1. No evidence of acute pulmonary embolism. 2. Mild  cardiomegaly. Left ventricular hypertrophy. Mild aortic valvular calcification. 3. Moderate three-vessel coronary atherosclerosis and mild aortic atherosclerosis. 4. Mild circumferential wall thickening involving the upper thoracic esophagus and of the distal esophagus. Query esophagitis as no distinct mass is identified. Upper endoscopy may be confirmatory. 5. COPD/emphysema. No acute cardiopulmonary disease.    04/2016 Nuclear stress  There was no ST segment deviation noted during stress.  No T wave inversion was noted during stress.  The study is normal.  This is a low risk study.  The left ventricular ejection fraction is hyperdynamic (>65%).   01/2018 ABIs FINDINGS: Right ABI:  0.98  Left ABI:  0.92  Right Lower Extremity:  Normal arterial waveforms at the ankle.  Left Lower Extremity:  Normal arterial waveforms at the ankle.  IMPRESSION: 1. Borderline normal bilateral ABIs. Consider postexercise testing to exclude occult arterial occlusive disease.   Assessment and Plan  1. Afib -  CHADS2Vasc score is 3, continue anticoagulation - no symptoms, continue current meds EKG today shows sinus rhythm  2. CAD - noted on CT scan, nuclear stress test shows no significant ischemia -recent symptoms most consistent with GI etiology, recommended taking his omeprazole more regularly.   3.HTN -have tolerated higher bp's due to ongoing dizziness of unclear etiology.  - manual bp today 134/75, continue current meds    F/u 6 months   Arnoldo Lenis, M.D

## 2018-09-19 ENCOUNTER — Telehealth: Payer: Self-pay | Admitting: Cardiology

## 2018-09-19 MED ORDER — CARVEDILOL 3.125 MG PO TABS: 3.1250 mg | ORAL_TABLET | Freq: Every day | ORAL | 3 refills | Status: DC

## 2018-09-19 NOTE — Telephone Encounter (Signed)
°*  STAT* If patient is at the pharmacy, call can be transferred to refill team.   1. Which medications need to be refilled?  carvedilol (COREG) 3.125 MG tablet     2. Which pharmacy/location (including street and city if local pharmacy) is medication to be sent to? [facility]  3. Do they need a 30 day or 90 day supply?

## 2018-09-19 NOTE — Telephone Encounter (Signed)
Done

## 2019-02-07 ENCOUNTER — Ambulatory Visit: Payer: Medicare Other | Admitting: Cardiology

## 2019-02-08 DIAGNOSIS — N183 Chronic kidney disease, stage 3 (moderate): Secondary | ICD-10-CM | POA: Diagnosis not present

## 2019-02-08 DIAGNOSIS — I10 Essential (primary) hypertension: Secondary | ICD-10-CM | POA: Diagnosis not present

## 2019-02-08 DIAGNOSIS — Z299 Encounter for prophylactic measures, unspecified: Secondary | ICD-10-CM | POA: Diagnosis not present

## 2019-02-08 DIAGNOSIS — J329 Chronic sinusitis, unspecified: Secondary | ICD-10-CM | POA: Diagnosis not present

## 2019-02-08 DIAGNOSIS — F17210 Nicotine dependence, cigarettes, uncomplicated: Secondary | ICD-10-CM | POA: Diagnosis not present

## 2019-02-08 DIAGNOSIS — Z6825 Body mass index (BMI) 25.0-25.9, adult: Secondary | ICD-10-CM | POA: Diagnosis not present

## 2019-02-08 DIAGNOSIS — E1122 Type 2 diabetes mellitus with diabetic chronic kidney disease: Secondary | ICD-10-CM | POA: Diagnosis not present

## 2019-02-08 DIAGNOSIS — E1165 Type 2 diabetes mellitus with hyperglycemia: Secondary | ICD-10-CM | POA: Diagnosis not present

## 2019-02-22 DIAGNOSIS — R42 Dizziness and giddiness: Secondary | ICD-10-CM | POA: Diagnosis not present

## 2019-02-22 DIAGNOSIS — R809 Proteinuria, unspecified: Secondary | ICD-10-CM | POA: Diagnosis not present

## 2019-02-22 DIAGNOSIS — Z299 Encounter for prophylactic measures, unspecified: Secondary | ICD-10-CM | POA: Diagnosis not present

## 2019-02-22 DIAGNOSIS — Z6824 Body mass index (BMI) 24.0-24.9, adult: Secondary | ICD-10-CM | POA: Diagnosis not present

## 2019-02-22 DIAGNOSIS — I10 Essential (primary) hypertension: Secondary | ICD-10-CM | POA: Diagnosis not present

## 2019-02-22 DIAGNOSIS — E1129 Type 2 diabetes mellitus with other diabetic kidney complication: Secondary | ICD-10-CM | POA: Diagnosis not present

## 2019-04-06 ENCOUNTER — Ambulatory Visit (INDEPENDENT_AMBULATORY_CARE_PROVIDER_SITE_OTHER): Payer: Medicare Other | Admitting: Otolaryngology

## 2019-04-06 DIAGNOSIS — J342 Deviated nasal septum: Secondary | ICD-10-CM | POA: Diagnosis not present

## 2019-04-06 DIAGNOSIS — R42 Dizziness and giddiness: Secondary | ICD-10-CM

## 2019-04-06 DIAGNOSIS — H95121 Granulation of postmastoidectomy cavity, right ear: Secondary | ICD-10-CM

## 2019-04-06 DIAGNOSIS — H903 Sensorineural hearing loss, bilateral: Secondary | ICD-10-CM | POA: Diagnosis not present

## 2019-04-06 DIAGNOSIS — J343 Hypertrophy of nasal turbinates: Secondary | ICD-10-CM

## 2019-04-06 DIAGNOSIS — J310 Chronic rhinitis: Secondary | ICD-10-CM | POA: Diagnosis not present

## 2019-04-07 ENCOUNTER — Other Ambulatory Visit (HOSPITAL_COMMUNITY): Payer: Self-pay | Admitting: Otolaryngology

## 2019-04-07 ENCOUNTER — Other Ambulatory Visit: Payer: Self-pay | Admitting: Otolaryngology

## 2019-04-07 DIAGNOSIS — H903 Sensorineural hearing loss, bilateral: Secondary | ICD-10-CM

## 2019-04-07 DIAGNOSIS — H905 Unspecified sensorineural hearing loss: Secondary | ICD-10-CM

## 2019-04-14 ENCOUNTER — Encounter (HOSPITAL_COMMUNITY): Payer: Self-pay

## 2019-04-14 ENCOUNTER — Ambulatory Visit (HOSPITAL_COMMUNITY): Payer: Medicare Other

## 2019-04-21 ENCOUNTER — Other Ambulatory Visit: Payer: Self-pay

## 2019-04-21 ENCOUNTER — Other Ambulatory Visit: Payer: Medicare Other

## 2019-04-21 DIAGNOSIS — R6889 Other general symptoms and signs: Secondary | ICD-10-CM | POA: Diagnosis not present

## 2019-04-21 DIAGNOSIS — Z20822 Contact with and (suspected) exposure to covid-19: Secondary | ICD-10-CM

## 2019-04-23 LAB — NOVEL CORONAVIRUS, NAA: SARS-CoV-2, NAA: NOT DETECTED

## 2019-04-24 ENCOUNTER — Ambulatory Visit (HOSPITAL_COMMUNITY): Admission: RE | Admit: 2019-04-24 | Payer: Medicare Other | Source: Ambulatory Visit

## 2019-04-27 ENCOUNTER — Telehealth: Payer: Self-pay | Admitting: *Deleted

## 2019-04-27 NOTE — Telephone Encounter (Signed)
Patient verbally consented for telehealth visits with [facility] and understands that his insurance company will be billed for the encounter.   Will have vitals available

## 2019-05-03 ENCOUNTER — Telehealth: Payer: Medicare Other | Admitting: Cardiology

## 2019-05-05 ENCOUNTER — Telehealth (INDEPENDENT_AMBULATORY_CARE_PROVIDER_SITE_OTHER): Payer: No Typology Code available for payment source | Admitting: Cardiology

## 2019-05-05 ENCOUNTER — Encounter: Payer: Self-pay | Admitting: *Deleted

## 2019-05-05 VITALS — BP 148/75 | HR 68 | Ht 70.0 in | Wt 160.0 lb

## 2019-05-05 DIAGNOSIS — I4891 Unspecified atrial fibrillation: Secondary | ICD-10-CM | POA: Diagnosis not present

## 2019-05-05 DIAGNOSIS — I10 Essential (primary) hypertension: Secondary | ICD-10-CM

## 2019-05-05 DIAGNOSIS — I2510 Atherosclerotic heart disease of native coronary artery without angina pectoris: Secondary | ICD-10-CM

## 2019-05-05 NOTE — Patient Instructions (Signed)
Medication Instructions:  Your physician recommends that you continue on your current medications as directed. Please refer to the Current Medication list given to you today.  If you need a refill on your cardiac medications before your next appointment, please call your pharmacy.   Lab work: NONE   If you have labs (blood work) drawn today and your tests are completely normal, you will receive your results only by: . MyChart Message (if you have MyChart) OR . A paper copy in the mail If you have any lab test that is abnormal or we need to change your treatment, we will call you to review the results.  Testing/Procedures: NONE   Follow-Up: At [facility], you and your health needs are our priority.  As part of our continuing mission to provide you with exceptional heart care, we have created designated Provider Care Teams.  These Care Teams include your primary Cardiologist (physician) and Advanced Practice Providers (APPs -  Physician Assistants and Nurse Practitioners) who all work together to provide you with the care you need, when you need it. You will need a follow up appointment in 6 months.  Please call our office 2 months in advance to schedule this appointment.  You may see Branch, Jonathan, MD or one of the following Advanced Practice Providers on your designated Care Team:   Brittany Strader, PA-C ([place] Office) . Michele Lenze, PA-C ([place] Office)  Any Other Special Instructions Will Be Listed Below (If Applicable). Thank you for choosing [facility]!     

## 2019-05-05 NOTE — Progress Notes (Signed)
Virtual Visit via Telephone Note   This visit type was conducted due to national recommendations for restrictions regarding the COVID-19 Pandemic (e.g. social distancing) in an effort to limit this patient's exposure and mitigate transmission in our community.  Due to his co-morbid illnesses, this patient is at least at moderate risk for complications without adequate follow up.  This format is felt to be most appropriate for this patient at this time.  The patient did not have access to video technology/had technical difficulties with video requiring transitioning to audio format only (telephone).  All issues noted in this document were discussed and addressed.  No physical exam could be performed with this format.  Please refer to the patient's chart for his  consent to telehealth for [name].   Date:  05/05/2019   ID:  Nicolas Berry, DOB 1946/04/30, MRN [id number]  Patient Location: Home Provider Location: Office  PCP:  Monico Blitz, MD  Cardiologist:  Carlyle Dolly, MD  Electrophysiologist:  None   Evaluation Performed:  Follow-Up Visit  Chief Complaint:  Follow up visit  History of Present Illness:    Nicolas Berry is a 73 y.o. male seen today for follow upof the following medical problems.  1. Afib/PAF - previously seenin afib clinic - rate control with coreg, eliquis for stroke prevention.   - denies any palpitations - compliant with meds. Can have some bruising but no significant bleeding on eliquis.    2. CAD - incidental finding on recent CT PE - 04/2016 nuclear stress test no ischemia  - previous occasional atypical chest pain, left lower rib cage into axilla. No exertional symptoms. - can have some chest pain in the evening. Stabbing pain after eating dinner and snackin., Not taking omepraozle daily  - mild chest pain at times, usually occurs with taking pills at night. [place] his [place] without troubles.    3. Esophageal thickening - incidental finding on CT scan - followed by pcp  4. COPD - emphsymatous changes noted on recent CT PE - followed by pcp  5. Dizziness  - [facility]  - coreg lowered to 3.140m in AM and 6.25mg  in PMby other provider.  - ongoing, unchanged. Feeling of fullness in head. Can occur sitting or standing. Feeling of being off balance. Can occur with turning head.   - seen by neurologist. Weaning neurontin. Has upcoming MRI.    6. Leg pains - occurs at rest. Occurs in thighs and calves.  - can be worst with walking. 01/2018 ABI right 0.98, left 0.92   7. HTN - has not taken meds yet today. - normally bp's at home SBP 140s.   SH: works on [employer] course.   The patient does not have symptoms concerning for COVID-19 infection (fever, chills, cough, or new shortness of breath).    Past Medical History:  Diagnosis Date  . Anxiety    OA multiple joints  . Arthritis   . BPH (benign prostatic hyperplasia)   . COPD (chronic obstructive pulmonary disease) ([place])   . DDD (degenerative disc disease), lumbar   . Diabetes mellitus without complication ([place])   . Fatty liver   . GERD (gastroesophageal reflux disease)   . Hyperlipidemia   . Hypertension    Past Surgical History:  Procedure Laterality Date  . BACK SURGERY    . HERNIA REPAIR       Current Meds  Medication Sig  . acarbose (PRECOSE)  100 MG tablet Take 100 mg by mouth 3 (three) times daily with meals.  Marland Kitchen apixaban (ELIQUIS) 5 MG TABS tablet Take 1 tablet (5 mg total) by mouth 2 (two) times daily.  . budesonide-formoterol (SYMBICORT) 160-4.5 MCG/ACT inhaler Inhale 2 puffs into the lungs 2 (two) times daily. As needed  . carvedilol (COREG) 6.25 MG tablet Take 6.25 mg by mouth 2 (two) times daily with a meal.  . finasteride (PROSCAR) 5 MG tablet Take 5 mg by mouth daily.  Marland Kitchen gabapentin (NEURONTIN) 300 MG capsule Take 300 mg by mouth at bedtime.   Marland Kitchen losartan (COZAAR)  50 MG tablet Take 50 mg by mouth daily.  . metFORMIN (GLUCOPHAGE) 1000 MG tablet Take 1,000 mg by mouth 2 (two) times daily with a meal.  . simvastatin (ZOCOR) 40 MG tablet Take 40 mg by mouth daily.  . traZODone (DESYREL) 100 MG tablet Take 100 mg by mouth at bedtime.   . Vitamin D, Ergocalciferol, (DRISDOL) 50000 UNITS CAPS Take 50,000 Units by mouth every 30 (thirty) days.     Allergies:   Patient has no known allergies.   Social History   Tobacco Use  . Smoking status: Current Every Day Smoker    Packs/day: 1.00    Types: Cigarettes    Start date: 08/06/1960  . Smokeless tobacco: Never Used  . Tobacco comment:  started back recently due to daughter  Substance Use Topics  . Alcohol use: No  . Drug use: Yes    Types: Marijuana     Family Hx: The patient's family history includes Cancer in his mother and sister; Diabetes in his father.  ROS:   Please see the history of present illness.     All other systems reviewed and are negative.   Prior CV studies:   The following studies were reviewed today:  04/2016 echo Study Conclusions  - Left ventricle: Turbulent flow through LVOT. Systolic anterior motio of MV leaflets Peak gradient through LV/LVOT /AV is 15 mm Hg at rest No signifiant obstruction at rest. The cavity size was normal. Wall thickness was increased in a pattern of mild LVH. Systolic function was vigorous. The estimated ejection fraction was in the range of 65% to 70%. Doppler parameters are consistent with abnormal left ventricular relaxation (grade 1 diastolic dysfunction). - Aortic valve: AV is thickened, calcified with minimally restricted motion. - Mitral valve: SAM of mitral valve leaflets. Calcified annulus. Mildly thickened leaflets . - Left atrium: The atrium was mildly dilated. - Pulmonary arteries: PA peak pressure: 32 mm Hg (S).  04/2016 CT PE IMPRESSION: 1. No evidence of acute pulmonary embolism. 2. Mild cardiomegaly.  Left ventricular hypertrophy. Mild aortic valvular calcification. 3. Moderate three-vessel coronary atherosclerosis and mild aortic atherosclerosis. 4. Mild circumferential wall thickening involving the upper thoracic esophagus and of the distal esophagus. Query esophagitis as no distinct mass is identified. Upper endoscopy may be confirmatory. 5. COPD/emphysema. No acute cardiopulmonary disease.    04/2016 Nuclear stress  There was no ST segment deviation noted during stress.  No T wave inversion was noted during stress.  The study is normal.  This is a low risk study.  The left ventricular ejection fraction is hyperdynamic (>65%).   01/2018 ABIs FINDINGS: Right ABI: 0.98  Left ABI: 0.92  Right Lower Extremity: Normal arterial waveforms at the ankle.  Left Lower Extremity: Normal arterial waveforms at the ankle.  IMPRESSION: 1. Borderline normal bilateral ABIs. Consider postexercise testing to exclude occult arterial occlusive disease.   Labs/Other  Tests and Data Reviewed:    EKG:  No ECG reviewed.  Recent Labs: No results found for requested labs within last 8760 hours.   Recent Lipid Panel Lab Results  Component Value Date/Time   CHOL 118 05/18/2016 04:42 AM   TRIG 143 05/18/2016 04:42 AM   HDL 31 (L) 05/18/2016 04:42 AM   CHOLHDL 3.8 05/18/2016 04:42 AM   LDLCALC 58 05/18/2016 04:42 AM    Wt Readings from Last 3 Encounters:  05/05/19 160 lb (72.6 kg)  08/02/18 170 lb 3.2 oz (77.2 kg)  01/31/18 164 lb (74.4 kg)     Objective:    Vital Signs:  BP (!) 148/75   Pulse 68   Ht 5\' 10"  (1.778 m)   Wt 160 lb (72.6 kg)   BMI 22.96 kg/m    Today's Vitals   05/05/19 0831  BP: (!) 148/75  Pulse: 68  Weight: 160 lb (72.6 kg)  Height: 5\' 10"  (1.778 m)   Body mass index is 22.96 kg/m.  Normal affect. Normal speech pattern and tone. Comfortable, no apparent distress. No audible signs of SOB or wheezing.   ASSESSMENT & PLAN:    1.  Afib - no symptoms, continue current meds  2. CAD - noted on CT scan, nuclear stress test shows no significant ischemia -various atypical chest pain at times, continue to monitor at this time  3.HTN -have tolerated higher bp's due to ongoing dizziness of unclear etiology. - continue current meds      COVID-19 Education: The signs and symptoms of COVID-19 were discussed with the patient and how to seek care for testing (follow up with PCP or arrange E-visit).  The importance of social distancing was discussed today.  Time:   Today, I have spent 14 minutes with the patient with telehealth technology discussing the above problems.     Medication Adjustments/Labs and Tests Ordered: Current medicines are reviewed at length with the patient today.  Concerns regarding medicines are outlined above.   Tests Ordered: No orders of the defined types were placed in this encounter.   Medication Changes: No orders of the defined types were placed in this encounter.   Follow Up:  In Person in 6 month(s)  Signed, Carlyle Dolly, MD  05/05/2019 8:50 AM    [place]

## 2019-06-07 DIAGNOSIS — Z7984 Long term (current) use of oral hypoglycemic drugs: Secondary | ICD-10-CM | POA: Diagnosis not present

## 2019-06-07 DIAGNOSIS — Z7901 Long term (current) use of anticoagulants: Secondary | ICD-10-CM | POA: Diagnosis not present

## 2019-06-07 DIAGNOSIS — E1140 Type 2 diabetes mellitus with diabetic neuropathy, unspecified: Secondary | ICD-10-CM | POA: Diagnosis not present

## 2019-06-07 DIAGNOSIS — J439 Emphysema, unspecified: Secondary | ICD-10-CM | POA: Diagnosis not present

## 2019-06-07 DIAGNOSIS — I4891 Unspecified atrial fibrillation: Secondary | ICD-10-CM | POA: Diagnosis not present

## 2019-06-07 DIAGNOSIS — I10 Essential (primary) hypertension: Secondary | ICD-10-CM | POA: Diagnosis not present

## 2019-06-07 DIAGNOSIS — F419 Anxiety disorder, unspecified: Secondary | ICD-10-CM | POA: Diagnosis not present

## 2019-06-07 DIAGNOSIS — M130 Polyarthritis, unspecified: Secondary | ICD-10-CM | POA: Diagnosis not present

## 2019-06-07 DIAGNOSIS — R42 Dizziness and giddiness: Secondary | ICD-10-CM | POA: Diagnosis not present

## 2019-06-13 DIAGNOSIS — M130 Polyarthritis, unspecified: Secondary | ICD-10-CM | POA: Diagnosis not present

## 2019-06-13 DIAGNOSIS — I4891 Unspecified atrial fibrillation: Secondary | ICD-10-CM | POA: Diagnosis not present

## 2019-06-13 DIAGNOSIS — Z7984 Long term (current) use of oral hypoglycemic drugs: Secondary | ICD-10-CM | POA: Diagnosis not present

## 2019-06-13 DIAGNOSIS — E1140 Type 2 diabetes mellitus with diabetic neuropathy, unspecified: Secondary | ICD-10-CM | POA: Diagnosis not present

## 2019-06-13 DIAGNOSIS — R42 Dizziness and giddiness: Secondary | ICD-10-CM | POA: Diagnosis not present

## 2019-06-13 DIAGNOSIS — I10 Essential (primary) hypertension: Secondary | ICD-10-CM | POA: Diagnosis not present

## 2019-10-20 ENCOUNTER — Ambulatory Visit: Payer: Medicare Other | Attending: Internal Medicine

## 2019-10-20 ENCOUNTER — Other Ambulatory Visit: Payer: Self-pay

## 2019-10-20 DIAGNOSIS — Z20822 Contact with and (suspected) exposure to covid-19: Secondary | ICD-10-CM | POA: Diagnosis not present

## 2019-10-21 LAB — NOVEL CORONAVIRUS, NAA: SARS-CoV-2, NAA: NOT DETECTED

## 2019-10-24 ENCOUNTER — Telehealth: Payer: Self-pay | Admitting: General Practice

## 2019-10-24 NOTE — Telephone Encounter (Signed)
Negative COVID results given. Patient results "NOT Detected." Caller expressed understanding. ° °

## 2019-11-04 ENCOUNTER — Ambulatory Visit: Payer: Medicare Other

## 2019-11-05 ENCOUNTER — Ambulatory Visit: Payer: Medicare Other | Attending: Internal Medicine

## 2019-11-05 DIAGNOSIS — Z23 Encounter for immunization: Secondary | ICD-10-CM | POA: Insufficient documentation

## 2019-11-05 NOTE — Progress Notes (Signed)
   Covid-19 Vaccination Clinic  Name:  Nicolas Berry    MRN: [id number] DOB: June 26, 1946  11/05/2019  Mr. Drakos was observed post Covid-19 immunization for 15 minutes without incidence. He was provided with Vaccine Information Sheet and instruction to access the V-Safe system.   Mr. Schlie was instructed to call 911 with any severe reactions post vaccine: Marland Kitchen Difficulty breathing  . Swelling of your face and throat  . A fast heartbeat  . A bad rash all over your body  . Dizziness and weakness    Immunizations Administered    Name Date Dose VIS Date Route   Pfizer COVID-19 Vaccine 11/05/2019  2:15 PM 0.3 mL 09/01/2019 Intramuscular   Manufacturer: [place]   Lot: [id number]   [place]: [id number]

## 2019-11-28 ENCOUNTER — Ambulatory Visit: Payer: Medicare Other

## 2019-11-28 ENCOUNTER — Ambulatory Visit: Payer: Medicare Other | Attending: Internal Medicine

## 2019-11-28 DIAGNOSIS — Z23 Encounter for immunization: Secondary | ICD-10-CM

## 2019-11-28 NOTE — Progress Notes (Signed)
   Covid-19 Vaccination Clinic  Name:  Nicolas Berry    MRN: [id number] DOB: [birth year]  11/28/2019  [name] was observed post Covid-19 immunization for 15 minutes without incident. He was provided with Vaccine Information Sheet and instruction to access the V-Safe system.   [name] was instructed to call 911 with any severe reactions post vaccine: Marland Kitchen Difficulty breathing  . Swelling of face and throat  . A fast heartbeat  . A bad rash all over body  . Dizziness and weakness   Immunizations Administered    Name Date Dose VIS Date Route   Pfizer COVID-19 Vaccine 11/28/2019  2:19 PM 0.3 mL 09/01/2019 Intramuscular   Manufacturer: [place]   Lot: [id number]   [place]: [id number]

## 2019-11-29 ENCOUNTER — Ambulatory Visit: Payer: Medicare Other

## 2020-01-03 ENCOUNTER — Other Ambulatory Visit (HOSPITAL_COMMUNITY)
Admission: AD | Admit: 2020-01-03 | Discharge: 2020-01-03 | Disposition: A | Discharge status: Home / Self Care | Payer: Medicare Other | Source: Ambulatory Visit | Attending: Urology | Admitting: Urology

## 2020-01-03 ENCOUNTER — Encounter: Payer: Self-pay | Admitting: Urology

## 2020-01-03 ENCOUNTER — Ambulatory Visit (INDEPENDENT_AMBULATORY_CARE_PROVIDER_SITE_OTHER): Payer: Medicare Other | Admitting: Urology

## 2020-01-03 ENCOUNTER — Other Ambulatory Visit: Payer: Self-pay

## 2020-01-03 VITALS — BP 126/69 | HR 54 | Temp 98.1°F | Ht 70.0 in | Wt 150.0 lb

## 2020-01-03 DIAGNOSIS — R350 Frequency of micturition: Secondary | ICD-10-CM

## 2020-01-03 DIAGNOSIS — N400 Enlarged prostate without lower urinary tract symptoms: Secondary | ICD-10-CM | POA: Diagnosis not present

## 2020-01-03 LAB — POCT URINALYSIS DIPSTICK
Bilirubin, UA: NEGATIVE
Glucose, UA: NEGATIVE
Ketones, UA: NEGATIVE
Nitrite, UA: POSITIVE
Protein, UA: POSITIVE — AB
Spec Grav, UA: 1.01 (ref 1.010–1.025)
Urobilinogen, UA: NEGATIVE E.U./dL — AB
pH, UA: 5 (ref 5.0–8.0)

## 2020-01-03 LAB — BLADDER SCAN AMB NON-IMAGING: Scan Result: 23

## 2020-01-03 MED ORDER — TAMSULOSIN HCL 0.4 MG PO CAPS: 0.4000 mg | ORAL_CAPSULE | Freq: Every day | ORAL | 11 refills | Status: DC

## 2020-01-03 NOTE — Patient Instructions (Signed)

## 2020-01-03 NOTE — Progress Notes (Signed)
Urological Symptom Review  Patient is experiencing the following symptoms: Frequent urination Leakage of urine Stream starts and stops Getting up at night  Review of Systems  Gastrointestinal (upper)  : Negative for upper GI symptoms  Gastrointestinal (lower) : Constipation  Constitutional : Negative for symptoms  Skin: Negative for skin symptoms  Eyes: Blurred vision   Ear/Nose/Throat : Negative for Ear/Nose/Throat symptoms  Hematologic/Lymphatic: Negative for Hematologic/Lymphatic symptoms  Cardiovascular : Negative for cardiovascular symptoms  Respiratory : Shortness of breath  Endocrine: Negative for endocrine symptoms  Musculoskeletal: Negative for musculoskeletal symptoms  Neurological: Dizziness  Psychologic: Negative for psychiatric symptoms

## 2020-01-03 NOTE — Progress Notes (Signed)
01/03/2020 1:52 PM   Nicolas Berry 27-Feb-1946 [id number]  Referring provider: Monico Blitz, MD [address] [place],  [place] [address]  Urinary frequency  HPI: Nicolas Berry is a 74yo here for evaluation of urinary frequency. He has had worsening LUTS for the past year. He was previously seen by Dr. Jeffie Berry for gross hematuria 5-6 years ago and was placed on finasteride. NO hematuria since then. Stream good. Nocturia 3x, frequency q 45-60 minutes. He has urgency and urge incontinence. He has dribbling. He does not wear pads. He feels that he has not emptied his bladder well.    PMH: Past Medical History:  Diagnosis Date  . Anxiety    OA multiple joints  . Arthritis   . Atrial fibrillation ([place])   . BPH (benign prostatic hyperplasia)   . COPD (chronic obstructive pulmonary disease) ([place])   . DDD (degenerative disc disease), lumbar   . Diabetes mellitus without complication ([place])   . Fatty liver   . GERD (gastroesophageal reflux disease)   . Hyperlipidemia   . Hypertension     Surgical History: Past Surgical History:  Procedure Laterality Date  . BACK SURGERY    . HERNIA REPAIR      Home Medications:  Allergies as of 01/03/2020   No Known Allergies     Medication List       Accurate as of January 03, 2020  1:52 PM. If you have any questions, ask your nurse or doctor.        acarbose 100 MG tablet Commonly known as: PRECOSE Take 100 mg by mouth 3 (three) times daily with meals.   apixaban 5 MG Tabs tablet Commonly known as: ELIQUIS Take 1 tablet (5 mg total) by mouth 2 (two) times daily.   budesonide-formoterol 160-4.5 MCG/ACT inhaler Commonly known as: SYMBICORT Inhale 2 puffs into the lungs 2 (two) times daily. As needed   carvedilol 6.25 MG tablet Commonly known as: COREG Take 6.25 mg by mouth 2 (two) times daily with a meal.   finasteride 5 MG tablet Commonly known as: PROSCAR Take 5 mg by mouth daily.   gabapentin 300 MG capsule Commonly known as:  NEURONTIN Take 300 mg by mouth at bedtime.   HYDROcodone-acetaminophen 7.5-325 MG tablet Commonly known as: NORCO Take 1 tablet by mouth every 6 (six) hours as needed for moderate pain.   losartan 50 MG tablet Commonly known as: COZAAR Take 50 mg by mouth daily.   metFORMIN 1000 MG tablet Commonly known as: GLUCOPHAGE Take 1,000 mg by mouth 2 (two) times daily with a meal.   rosuvastatin 40 MG tablet Commonly known as: CRESTOR Take 40 mg by mouth daily.   simvastatin 40 MG tablet Commonly known as: ZOCOR Take 40 mg by mouth daily.   traZODone 100 MG tablet Commonly known as: DESYREL Take 100 mg by mouth at bedtime.   Vitamin D (Ergocalciferol) 1.25 MG (50000 UNIT) Caps capsule Commonly known as: DRISDOL Take 50,000 Units by mouth every 30 (thirty) days.       Allergies: No Known Allergies  Family History: Family History  Problem Relation Age of Onset  . Cancer Mother        bladder  . Diabetes Father   . Cancer Sister        breast     Social History:  reports that he has been smoking cigarettes. He started smoking about 59 years ago. He has been smoking about 1.00 pack per day. He has never used smokeless  tobacco. He reports current alcohol use. He reports current drug use. Drug: Marijuana.  ROS: All other review of systems were reviewed and are negative except what is noted above in HPI  Physical Exam: BP 126/69   Pulse (!) 54   Temp 98.1 F (36.7 C)   Ht 5\' 10"  (1.778 m)   Wt 150 lb (68 kg)   BMI 21.52 kg/m   Constitutional:  Alert and oriented, No acute distress. HEENT: [place] AT, moist mucus membranes.  Trachea midline, no masses. Cardiovascular: No clubbing, cyanosis, or edema. Respiratory: Normal respiratory effort, no increased work of breathing. GI: Abdomen is soft, nontender, nondistended, no abdominal masses GU: No CVA tenderness. Circumcised phallus. No masses/lesions on penis, testis, scrotum. Prostate 40g smooth no nodules no induration.    Lymph: No cervical or inguinal lymphadenopathy. Skin: No rashes, bruises or suspicious lesions. Neurologic: Grossly intact, no focal deficits, moving all 4 extremities. Psychiatric: Normal mood and affect.  Laboratory Data: Lab Results  Component Value Date   WBC 11.1 (H) 05/21/2016   HGB 16.0 05/21/2016   HCT 47.5 05/21/2016   MCV 96.0 05/21/2016   PLT 151 05/21/2016    Lab Results  Component Value Date   CREATININE 1.11 05/21/2016    No results found for: PSA  No results found for: TESTOSTERONE  Lab Results  Component Value Date   HGBA1C 5.8 (H) 05/17/2016    Urinalysis    Component Value Date/Time   COLORURINE YELLOW 05/17/2016 2000   APPEARANCEUR CLEAR 05/17/2016 2000   LABSPEC 1.020 05/17/2016 2000   PHURINE 5.5 05/17/2016 2000   GLUCOSEU NEGATIVE 05/17/2016 2000   HGBUR NEGATIVE 05/17/2016 2000   BILIRUBINUR neg 01/03/2020 1348   KETONESUR TRACE (A) 05/17/2016 2000   PROTEINUR Positive (A) 01/03/2020 1348   PROTEINUR NEGATIVE 05/17/2016 2000   UROBILINOGEN negative (A) 01/03/2020 1348   UROBILINOGEN 0.2 08/05/2012 0957   NITRITE positive 01/03/2020 1348   NITRITE NEGATIVE 05/17/2016 2000   LEUKOCYTESUR Small (1+) (A) 01/03/2020 1348    No results found for: LABMICR, [place], RBCUA, LABEPIT, MUCUS, BACTERIA  Pertinent Imaging:  No results found for this or any previous visit. No results found for this or any previous visit. No results found for this or any previous visit. No results found for this or any previous visit. No results found for this or any previous visit. No results found for this or any previous visit. No results found for this or any previous visit. No results found for this or any previous visit.  Assessment & Plan:    1. Benign prostatic hyperplasia, unspecified whether lower urinary tract symptoms present -flomax 0.4mg  qhs - Bladder Scan (Post Void Residual) in office - POCT urinalysis dipstick  2. Urinary frequency -flomax  0.4mg  qhs   No follow-ups on file.  Nicolette Bang, MD  [facility] [place]

## 2020-01-03 NOTE — Addendum Note (Signed)
Addended byIris Pert on: 01/03/2020 04:31 PM   Modules accepted: Orders

## 2020-01-05 ENCOUNTER — Encounter: Payer: Self-pay | Admitting: *Deleted

## 2020-01-05 ENCOUNTER — Other Ambulatory Visit: Payer: Self-pay

## 2020-01-05 ENCOUNTER — Encounter: Payer: Self-pay | Admitting: Cardiology

## 2020-01-05 ENCOUNTER — Ambulatory Visit (INDEPENDENT_AMBULATORY_CARE_PROVIDER_SITE_OTHER): Payer: Medicare Other | Admitting: Cardiology

## 2020-01-05 VITALS — BP 112/64 | HR 60 | Ht 70.0 in | Wt 154.0 lb

## 2020-01-05 DIAGNOSIS — I4891 Unspecified atrial fibrillation: Secondary | ICD-10-CM

## 2020-01-05 DIAGNOSIS — I10 Essential (primary) hypertension: Secondary | ICD-10-CM | POA: Diagnosis not present

## 2020-01-05 DIAGNOSIS — I2510 Atherosclerotic heart disease of native coronary artery without angina pectoris: Secondary | ICD-10-CM | POA: Diagnosis not present

## 2020-01-05 NOTE — Progress Notes (Signed)
Clinical Summary Nicolas Berry is a 74 y.o.male seen today for follow upof the following medical problems.  1. Afib/PAF - previously seenin afib clinic - rate control with coreg, eliquis for stroke prevention.    - taking coreg 3.125mg  bid, no recent palpitations - no bleeding on eliquis.   2. CAD - incidental finding on recent CT PE - 04/2016 nuclear stress test no ischemia   - mild chest pain at times, usually occurs with taking pills at night. [place] his [place] without troubles.   3. Esophageal thickening - incidental finding on CT scan - followed by pcp  4. COPD - emphsymatous changes noted on recent CT PE - followed by pcp  5. Dizziness  - [facility]  - coreg lowered to 3.147m in AM and 6.25mg  in PMby other provider.  - ongoing, unchanged. Feeling of fullness in head. Can occur sitting or standing. Feeling of being off balance. Can occur with turning head.   - seen by neurologist. Weaning neurontin. Has upcoming MRI.    6. Leg pains - occurs at rest. Occurs in thighs and calves.  - can be worst with walking. 01/2018 ABI right 0.98, left 0.92   7. HTN - compliant with meds    SH: works on golf course.  Completed both covid vaccines.  Past Medical History:  Diagnosis Date  . Anxiety    OA multiple joints  . Arthritis   . Atrial fibrillation ([place])   . BPH (benign prostatic hyperplasia)   . COPD (chronic obstructive pulmonary disease) ([place])   . DDD (degenerative disc disease), lumbar   . Diabetes mellitus without complication ([place])   . Fatty liver   . GERD (gastroesophageal reflux disease)   . Hyperlipidemia   . Hypertension      No Known Allergies   Current Outpatient Medications  Medication Sig Dispense Refill  . acarbose (PRECOSE) 100 MG tablet Take 100 mg by mouth 3 (three) times daily with meals.    Marland Kitchen apixaban (ELIQUIS) 5 MG TABS tablet Take 1 tablet (5 mg  total) by mouth 2 (two) times daily. 60 tablet 1  . budesonide-formoterol (SYMBICORT) 160-4.5 MCG/ACT inhaler Inhale 2 puffs into the lungs 2 (two) times daily. As needed 1 Inhaler 3  . carvedilol (COREG) 6.25 MG tablet Take 6.25 mg by mouth 2 (two) times daily with a meal.    . finasteride (PROSCAR) 5 MG tablet Take 5 mg by mouth daily.    Marland Kitchen gabapentin (NEURONTIN) 300 MG capsule Take 300 mg by mouth at bedtime.   1  . HYDROcodone-acetaminophen (NORCO) 7.5-325 MG tablet Take 1 tablet by mouth every 6 (six) hours as needed for moderate pain.    Marland Kitchen losartan (COZAAR) 50 MG tablet Take 50 mg by mouth daily.    . metFORMIN (GLUCOPHAGE) 1000 MG tablet Take 1,000 mg by mouth 2 (two) times daily with a meal.    . rosuvastatin (CRESTOR) 40 MG tablet Take 40 mg by mouth daily.    . simvastatin (ZOCOR) 40 MG tablet Take 40 mg by mouth daily.    . tamsulosin (FLOMAX) 0.4 MG CAPS capsule Take 1 capsule (0.4 mg total) by mouth daily. 30 capsule 11  . traZODone (DESYREL) 100 MG tablet Take 100 mg by mouth at bedtime.     . Vitamin D, Ergocalciferol, (DRISDOL) 50000 UNITS CAPS Take 50,000 Units by mouth every 30 (thirty) days.     No current facility-administered medications  for this visit.     Past Surgical History:  Procedure Laterality Date  . BACK SURGERY    . HERNIA REPAIR       No Known Allergies    Family History  Problem Relation Age of Onset  . Cancer Mother        bladder  . Diabetes Father   . Cancer Sister        breast      Social History Nicolas Berry reports that he has been smoking cigarettes. He started smoking about 59 years ago. He has been smoking about 1.00 pack per day. He has never used smokeless tobacco. Nicolas Berry reports current alcohol use.   Review of Systems CONSTITUTIONAL: No weight loss, fever, chills, weakness or fatigue.  HEENT: Eyes: No visual loss, blurred vision, double vision or yellow sclerae.No hearing loss, sneezing, congestion, runny nose or sore  throat.  SKIN: No rash or itching.  CARDIOVASCULAR: per hpi RESPIRATORY: No shortness of breath, cough or sputum.  GASTROINTESTINAL: No anorexia, nausea, vomiting or diarrhea. No abdominal pain or blood.  GENITOURINARY: No burning on urination, no polyuria NEUROLOGICAL: No headache, dizziness, syncope, paralysis, ataxia, numbness or tingling in the extremities. No change in bowel or bladder control.  MUSCULOSKELETAL: No muscle, back pain, joint pain or stiffness.  LYMPHATICS: No enlarged nodes. No history of splenectomy.  PSYCHIATRIC: No history of depression or anxiety.  ENDOCRINOLOGIC: No reports of sweating, cold or heat intolerance. No polyuria or polydipsia.  Marland Kitchen   Physical Examination Today's Vitals   01/05/20 1510  BP: 112/64  Pulse: 60  SpO2: 96%  Weight: 154 lb (69.9 kg)  Height: 5\' 10"  (1.778 m)   Body mass index is 22.1 kg/m.  Gen: resting comfortably, no acute distress HEENT: no scleral icterus, pupils equal round and reactive, no palptable cervical adenopathy,  CV; RRR, no m/r/g, no jvd Resp: Clear to auscultation bilaterally GI: abdomen is soft, non-tender, non-distended, normal bowel sounds, no hepatosplenomegaly MSK: extremities are warm, no edema.  Skin: warm, no rash Neuro:  no focal deficits Psych: appropriate affect   Diagnostic Studies 04/2016 echo Study Conclusions  - Left ventricle: Turbulent flow through LVOT. Systolic anterior motio of MV leaflets Peak gradient through LV/LVOT /AV is 15 mm Hg at rest No signifiant obstruction at rest. The cavity size was normal. Wall thickness was increased in a pattern of mild LVH. Systolic function was vigorous. The estimated ejection fraction was in the range of 65% to 70%. Doppler parameters are consistent with abnormal left ventricular relaxation (grade 1 diastolic dysfunction). - Aortic valve: AV is thickened, calcified with minimally restricted motion. - Mitral valve: SAM of mitral valve  leaflets. Calcified annulus. Mildly thickened leaflets . - Left atrium: The atrium was mildly dilated. - Pulmonary arteries: PA peak pressure: 32 mm Hg (S).  04/2016 CT PE IMPRESSION: 1. No evidence of acute pulmonary embolism. 2. Mild cardiomegaly. Left ventricular hypertrophy. Mild aortic valvular calcification. 3. Moderate three-vessel coronary atherosclerosis and mild aortic atherosclerosis. 4. Mild circumferential wall thickening involving the upper thoracic esophagus and of the distal esophagus. Query esophagitis as no distinct mass is identified. Upper endoscopy may be confirmatory. 5. COPD/emphysema. No acute cardiopulmonary disease.    04/2016 Nuclear stress  There was no ST segment deviation noted during stress.  No T wave inversion was noted during stress.  The study is normal.  This is a low risk study.  The left ventricular ejection fraction is hyperdynamic (>65%).   01/2018 ABIs FINDINGS: Right  ABI: 0.98  Left ABI: 0.92  Right Lower Extremity: Normal arterial waveforms at the ankle.  Left Lower Extremity: Normal arterial waveforms at the ankle.  IMPRESSION: 1. Borderline normal bilateral ABIs. Consider postexercise testing to exclude occult arterial occlusive disease.     Assessment and Plan  1. Afib - no recent symptoms, continue current meds  2. CAD - noted on CT scan, nuclear stress test shows no significant ischemia - no symptoms, continue to monitor  3.HTN -have tolerated higher bp's due to ongoing dizziness of unclear etiology. - bp is at goal, continue current meds      Arnoldo Lenis, M.D.

## 2020-01-05 NOTE — Patient Instructions (Signed)

## 2020-01-06 LAB — URINE CULTURE: Culture: >=100000 — AB

## 2020-01-08 ENCOUNTER — Other Ambulatory Visit: Payer: Self-pay

## 2020-01-08 ENCOUNTER — Telehealth: Payer: Self-pay

## 2020-01-08 DIAGNOSIS — R350 Frequency of micturition: Secondary | ICD-10-CM

## 2020-01-08 MED ORDER — SULFAMETHOXAZOLE-TRIMETHOPRIM 800-160 MG PO TABS: 1.0000 | ORAL_TABLET | Freq: Two times a day (BID) | ORAL | 0 refills | Status: DC

## 2020-01-08 NOTE — Telephone Encounter (Signed)
-----   Message from Cleon Gustin, MD sent at 01/07/2020 11:32 AM EDT ----- Bactrim DS BID for 7 days please ----- Message ----- From: Dorisann Frames, RN Sent: 01/05/2020   2:42 PM EDT To: Cleon Gustin, MD  Culture report

## 2020-01-08 NOTE — Telephone Encounter (Signed)
Left message to return call 

## 2020-01-08 NOTE — Telephone Encounter (Signed)
Pt notified. Will go pick up rx today.

## 2020-01-19 ENCOUNTER — Telehealth: Payer: Self-pay | Admitting: Urology

## 2020-01-19 NOTE — Telephone Encounter (Signed)
Pt left vm concerning his test results. Would like a call back .

## 2020-01-22 NOTE — Telephone Encounter (Signed)
Pt had old voicemail from previous week concerning antibiotic for urine culture. Pt already received that message.

## 2020-01-31 ENCOUNTER — Encounter: Payer: Self-pay | Admitting: Internal Medicine

## 2020-02-21 ENCOUNTER — Ambulatory Visit: Payer: Medicare Other | Admitting: Urology

## 2020-03-13 NOTE — Progress Notes (Deleted)
Referring Provider: [facility] Primary Care Physician:  Center, [facility] Primary Gastroenterologist:  Dr. Marland Kitchen  No chief complaint on file.   HPI:   Nicolas Berry is a 74 y.o. male with medical history significant for atrial fibrillation on Eliquis, CAD, COPD, diabetes, HTN, HLD GERD, anxiety, arthritis who is presenting today at the request of [facility] for consult colonoscopy.  Per chart review, CT angio chest in 2017 with mild circumferential wall thickening involving the upper thoracic esophagus and distal esophagus just above the esophagogastric junction.  No discrete esophageal mass.  Today:  Past Medical History:  Diagnosis Date  . Anxiety    OA multiple joints  . Arthritis   . Atrial fibrillation ([place])   . BPH (benign prostatic hyperplasia)   . COPD (chronic obstructive pulmonary disease) ([place])   . DDD (degenerative disc disease), lumbar   . Diabetes mellitus without complication ([place])   . Fatty liver   . GERD (gastroesophageal reflux disease)   . Hyperlipidemia   . Hypertension     Past Surgical History:  Procedure Laterality Date  . BACK SURGERY    . HERNIA REPAIR      Current Outpatient Medications  Medication Sig Dispense Refill  . acarbose (PRECOSE) 100 MG tablet Take 100 mg by mouth daily.     Marland Kitchen apixaban (ELIQUIS) 5 MG TABS tablet Take 1 tablet (5 mg total) by mouth 2 (two) times daily. 60 tablet 1  . budesonide-formoterol (SYMBICORT) 160-4.5 MCG/ACT inhaler Inhale 2 puffs into the lungs 2 (two) times daily. As needed 1 Inhaler 3  . carvedilol (COREG) 6.25 MG tablet Take 3.125 mg by mouth 2 (two) times daily with a meal.     . finasteride (PROSCAR) 5 MG tablet Take 5 mg by mouth daily.    Marland Kitchen gabapentin (NEURONTIN) 300 MG capsule Take 300 mg by mouth 2 (two) times daily.   1  . HYDROcodone-acetaminophen (NORCO) 7.5-325 MG tablet Take 1 tablet by mouth every 6 (six) hours as needed for moderate pain.    Marland Kitchen losartan  (COZAAR) 50 MG tablet Take 50 mg by mouth daily.    . metFORMIN (GLUCOPHAGE) 1000 MG tablet Take 1,000 mg by mouth 2 (two) times daily with a meal.    . rosuvastatin (CRESTOR) 40 MG tablet Take 40 mg by mouth daily.    Marland Kitchen sulfamethoxazole-trimethoprim (BACTRIM DS) 800-160 MG tablet Take 1 tablet by mouth every 12 (twelve) hours. 14 tablet 0  . tamsulosin (FLOMAX) 0.4 MG CAPS capsule Take 0.4 mg by mouth daily.    . traZODone (DESYREL) 100 MG tablet Take 100 mg by mouth at bedtime.      No current facility-administered medications for this visit.    Allergies as of 03/14/2020  . (No Known Allergies)    Family History  Problem Relation Age of Onset  . Cancer Mother        bladder  . Diabetes Father   . Cancer Sister        breast     Social History   Socioeconomic History  . Marital status: Married    Spouse name: Not on file  . Number of children: 3  . Years of education: Not on file  . Highest education level: Not on file  Occupational History  . Occupation: retired  Tobacco Use  . Smoking status: Current Every Day Smoker    Packs/day: 1.00    Types: Cigarettes    Start date: 08/06/1960  .  Smokeless tobacco: Never Used  . Tobacco comment:  started back recently due to daughter  Vaping Use  . Vaping Use: Never used  Substance and Sexual Activity  . Alcohol use: Yes  . Drug use: Yes    Types: Marijuana  . Sexual activity: Not on file  Other Topics Concern  . Not on file  Social History Narrative  . Not on file   Social Determinants of Health   Financial Resource Strain:   . Difficulty of Paying Living Expenses:   Food Insecurity:   . Worried About Charity fundraiser in the Last Year:   . Arboriculturist in the Last Year:   Transportation Needs:   . Film/video editor (Medical):   Marland Kitchen Lack of Transportation (Non-Medical):   Physical Activity:   . Days of Exercise per Week:   . Minutes of Exercise per Session:   Stress:   . Feeling of Stress :   Social  Connections:   . Frequency of Communication with Friends and Family:   . Frequency of Social Gatherings with Friends and Family:   . Attends Religious Services:   . Active Member of Clubs or Organizations:   . Attends Archivist Meetings:   Marland Kitchen Marital Status:   Intimate Partner Violence:   . Fear of Current or Ex-Partner:   . Emotionally Abused:   Marland Kitchen Physically Abused:   . Sexually Abused:     Review of Systems: Gen: Denies any fever, chills, fatigue, weight loss, lack of appetite.  CV: Denies chest pain, heart palpitations, peripheral edema, syncope.  Resp: Denies shortness of breath at rest or with exertion. Denies wheezing or cough.  GI: Denies dysphagia or odynophagia. Denies jaundice, hematemesis, fecal incontinence. GU : Denies urinary burning, urinary frequency, urinary hesitancy MS: Denies joint pain, muscle weakness, cramps, or limitation of movement.  Derm: Denies rash, itching, dry skin Psych: Denies depression, anxiety, memory loss, and confusion Heme: Denies bruising, bleeding, and enlarged lymph nodes.  Physical Exam: There were no vitals taken for this visit. General:   Alert and oriented. Pleasant and cooperative. Well-nourished and well-developed.  Head:  Normocephalic and atraumatic. Eyes:  Without icterus, sclera clear and conjunctiva pink.  Ears:  Normal auditory acuity. Nose:  No deformity, discharge,  or lesions. Mouth:  No deformity or lesions, oral mucosa pink.  Neck:  Supple, without mass or thyromegaly. Lungs:  Clear to auscultation bilaterally. No wheezes, rales, or rhonchi. No distress.  Heart:  S1, S2 present without murmurs appreciated.  Abdomen:  +BS, soft, non-tender and non-distended. No HSM noted. No guarding or rebound. No masses appreciated.  Rectal:  Deferred  Msk:  Symmetrical without gross deformities. Normal posture. Pulses:  Normal pulses noted. Extremities:  Without clubbing or edema. Neurologic:  Alert and  oriented x4;   grossly normal neurologically. Skin:  Intact without significant lesions or rashes. Cervical Nodes:  No significant cervical adenopathy. Psych:  Alert and cooperative. Normal mood and affect.

## 2020-03-14 ENCOUNTER — Ambulatory Visit: Payer: Medicare Other | Admitting: Gastroenterology

## 2020-05-12 NOTE — Progress Notes (Deleted)
Referring Provider: [facility] Primary Care Physician:  [facility], [facility] Primary Gastroenterologist:  Dr.  Gala Romney  No chief complaint on file.   HPI:   Nicolas Berry is a 74 y.o. male presenting today at the request of [facility] for consult colonoscopy. Office visit due to history of A. fib on Eliquis.   Abnormal CT esophagus in 2017.  Today:   Past Medical History:  Diagnosis Date  . Anxiety    OA multiple joints  . Arthritis   . Atrial fibrillation ([place])   . BPH (benign prostatic hyperplasia)   . COPD (chronic obstructive pulmonary disease) ([place])   . DDD (degenerative disc disease), lumbar   . Diabetes mellitus without complication ([place])   . Fatty liver   . GERD (gastroesophageal reflux disease)   . Hyperlipidemia   . Hypertension     Past Surgical History:  Procedure Laterality Date  . BACK SURGERY    . HERNIA REPAIR      Current Outpatient Medications  Medication Sig Dispense Refill  . acarbose (PRECOSE) 100 MG tablet Take 100 mg by mouth daily.     Marland Kitchen apixaban (ELIQUIS) 5 MG TABS tablet Take 1 tablet (5 mg total) by mouth 2 (two) times daily. 60 tablet 1  . budesonide-formoterol (SYMBICORT) 160-4.5 MCG/ACT inhaler Inhale 2 puffs into the lungs 2 (two) times daily. As needed 1 Inhaler 3  . carvedilol (COREG) 6.25 MG tablet Take 3.125 mg by mouth 2 (two) times daily with a meal.     . finasteride (PROSCAR) 5 MG tablet Take 5 mg by mouth daily.    Marland Kitchen gabapentin (NEURONTIN) 300 MG capsule Take 300 mg by mouth 2 (two) times daily.   1  . HYDROcodone-acetaminophen (NORCO) 7.5-325 MG tablet Take 1 tablet by mouth every 6 (six) hours as needed for moderate pain.    Marland Kitchen losartan (COZAAR) 50 MG tablet Take 50 mg by mouth daily.    . metFORMIN (GLUCOPHAGE) 1000 MG tablet Take 1,000 mg by mouth 2 (two) times daily with a meal.    . rosuvastatin (CRESTOR) 40 MG tablet Take 40 mg by mouth daily.    Marland Kitchen sulfamethoxazole-trimethoprim (BACTRIM  DS) 800-160 MG tablet Take 1 tablet by mouth every 12 (twelve) hours. 14 tablet 0  . tamsulosin (FLOMAX) 0.4 MG CAPS capsule Take 0.4 mg by mouth daily.    . traZODone (DESYREL) 100 MG tablet Take 100 mg by mouth at bedtime.      No current facility-administered medications for this visit.    Allergies as of 05/13/2020  . (No Known Allergies)    Family History  Problem Relation Age of Onset  . Cancer Mother        bladder  . Diabetes Father   . Cancer Sister        breast     Social History   Socioeconomic History  . Marital status: Married    Spouse name: Not on file  . Number of children: 3  . Years of education: Not on file  . Highest education level: Not on file  Occupational History  . Occupation: retired  Tobacco Use  . Smoking status: Current Every Day Smoker    Packs/day: 1.00    Types: Cigarettes    Start date: 08/06/1960  . Smokeless tobacco: Never Used  . Tobacco comment:  started back recently due to daughter  Vaping Use  . Vaping Use: Never used  Substance and Sexual Activity  .  Alcohol use: Yes  . Drug use: Yes    Types: Marijuana  . Sexual activity: Not on file  Other Topics Concern  . Not on file  Social History Narrative  . Not on file   Social Determinants of Health   Financial Resource Strain:   . Difficulty of Paying Living Expenses: Not on file  Food Insecurity:   . Worried About Charity fundraiser in the Last Year: Not on file  . Ran Out of Food in the Last Year: Not on file  Transportation Needs:   . Lack of Transportation (Medical): Not on file  . Lack of Transportation (Non-Medical): Not on file  Physical Activity:   . Days of Exercise per Week: Not on file  . Minutes of Exercise per Session: Not on file  Stress:   . Feeling of Stress : Not on file  Social Connections:   . Frequency of Communication with Friends and Family: Not on file  . Frequency of Social Gatherings with Friends and Family: Not on file  . Attends  Religious Services: Not on file  . Active Member of Clubs or Organizations: Not on file  . Attends Archivist Meetings: Not on file  . Marital Status: Not on file  Intimate Partner Violence:   . Fear of Current or Ex-Partner: Not on file  . Emotionally Abused: Not on file  . Physically Abused: Not on file  . Sexually Abused: Not on file    Review of Systems: Gen: Denies any fever, chills, fatigue, weight loss, lack of appetite.  CV: Denies chest pain, heart palpitations, peripheral edema, syncope.  Resp: Denies shortness of breath at rest or with exertion. Denies wheezing or cough.  GI: Denies dysphagia or odynophagia. Denies jaundice, hematemesis, fecal incontinence. GU : Denies urinary burning, urinary frequency, urinary hesitancy MS: Denies joint pain, muscle weakness, cramps, or limitation of movement.  Derm: Denies rash, itching, dry skin Psych: Denies depression, anxiety, memory loss, and confusion Heme: Denies bruising, bleeding, and enlarged lymph nodes.  Physical Exam: There were no vitals taken for this visit. General:   Alert and oriented. Pleasant and cooperative. Well-nourished and well-developed.  Head:  Normocephalic and atraumatic. Eyes:  Without icterus, sclera clear and conjunctiva pink.  Ears:  Normal auditory acuity. Nose:  No deformity, discharge,  or lesions. Mouth:  No deformity or lesions, oral mucosa pink.  Neck:  Supple, without mass or thyromegaly. Lungs:  Clear to auscultation bilaterally. No wheezes, rales, or rhonchi. No distress.  Heart:  S1, S2 present without murmurs appreciated.  Abdomen:  +BS, soft, non-tender and non-distended. No HSM noted. No guarding or rebound. No masses appreciated.  Rectal:  Deferred  Msk:  Symmetrical without gross deformities. Normal posture. Pulses:  Normal pulses noted. Extremities:  Without clubbing or edema. Neurologic:  Alert and  oriented x4;  grossly normal neurologically. Skin:  Intact without  significant lesions or rashes. Cervical Nodes:  No significant cervical adenopathy. Psych:  Alert and cooperative. Normal mood and affect.

## 2020-05-13 ENCOUNTER — Encounter: Payer: Self-pay | Admitting: Internal Medicine

## 2020-05-13 ENCOUNTER — Ambulatory Visit: Payer: Medicare Other | Admitting: Gastroenterology

## 2020-06-17 DIAGNOSIS — Z961 Presence of intraocular lens: Secondary | ICD-10-CM | POA: Diagnosis not present

## 2020-06-17 DIAGNOSIS — H52222 Regular astigmatism, left eye: Secondary | ICD-10-CM | POA: Diagnosis not present

## 2020-06-17 DIAGNOSIS — H401134 Primary open-angle glaucoma, bilateral, indeterminate stage: Secondary | ICD-10-CM | POA: Diagnosis not present

## 2020-06-17 DIAGNOSIS — E119 Type 2 diabetes mellitus without complications: Secondary | ICD-10-CM | POA: Diagnosis not present

## 2020-06-17 DIAGNOSIS — H532 Diplopia: Secondary | ICD-10-CM | POA: Diagnosis not present

## 2020-06-17 DIAGNOSIS — H50112 Monocular exotropia, left eye: Secondary | ICD-10-CM | POA: Diagnosis not present

## 2020-07-07 NOTE — Progress Notes (Signed)
Cardiology Office Note  Date: 07/08/2020   ID: Nicolas Berry, DOB 1946-01-05, MRN [id number]  PCP:  [facility]  Cardiologist:  Carlyle Dolly, MD Electrophysiologist:  None   Chief Complaint: Atrial fibrillation  History of Present Illness: Nicolas Berry is a 74 y.o. male with a history of atrial fibrillation, COPD, HLD, HTN, DM 2, BPH, arthritis, fatty liver disease, CAD, dizziness, leg pain.  Last saw Dr. Harl Bowie on 01/05/2020.  Atrial fibrillation rate was controlled with Coreg and Eliquis was continued for stroke prevention.  Taking Coreg 3.125 mg p.o. twice daily.  No bleeding on Eliquis.  Mild chest pain at times usually occurring when taking pills at night.  Walks regularly on his job without troubles.  Nuclear stress test 2017 showed no evidence of ischemia.  Emphysematous changes noted on recent CT and followed by PCP.  Issues with dizziness had been seen by neurologist.  Was weaning his Neurontin.  Had an upcoming MRI.  Leg pain occurring at rest.  ABIs on 01/2018 showed right ABI 0.98, left ABI 0.92.  He was compliant with his antihypertensive medication.  He is here for 64-month follow-up.  He states he is having some issues with his carvedilol.  He states each time he takes his carvedilol he has some chest pain in the left precordial area which does not radiate and is not bothersome.  He has no other associated symptoms other than the chest pain.  States he is not sure this is the cause but it seems to correlate to around the time he takes his carvedilol.  He denies any palpitations or arrhythmias.  He states he does have chronic dizziness.  Having some issues with balance.  States the [place] has done all sorts of neurological diagnostic studies which have not found anything to explain the balance/dizziness.  States she will be going soon to a pediatric ophthalmologist to check his eyes.  He has been having some blurred vision.  He does have diabetes and states it is  reasonably well controlled.  Blood pressure is well controlled with blood pressure today of 104/74.  Heart rate of 56.  Denies any CVA or TIA-like symptoms, PND, orthopnea, bleeding issues, DVT or PE-like symptoms.  States he has some leg pain mostly on the right due to sciatica and radiates down his right leg.  No classic claudication-like symptoms.  He continues to smoke.  Past Medical History:  Diagnosis Date  . Anxiety    OA multiple joints  . Arthritis   . Atrial fibrillation ([place])   . BPH (benign prostatic hyperplasia)   . COPD (chronic obstructive pulmonary disease) ([place])   . DDD (degenerative disc disease), lumbar   . Diabetes mellitus without complication ([place])   . Fatty liver   . GERD (gastroesophageal reflux disease)   . Hyperlipidemia   . Hypertension     Past Surgical History:  Procedure Laterality Date  . BACK SURGERY    . HERNIA REPAIR      Current Outpatient Medications  Medication Sig Dispense Refill  . acarbose (PRECOSE) 100 MG tablet Take 100 mg by mouth daily.     Marland Kitchen apixaban (ELIQUIS) 5 MG TABS tablet Take 1 tablet (5 mg total) by mouth 2 (two) times daily. 60 tablet 1  . budesonide-formoterol (SYMBICORT) 160-4.5 MCG/ACT inhaler Inhale 2 puffs into the lungs 2 (two) times daily. As needed 1 Inhaler 3  . finasteride (PROSCAR) 5 MG tablet Take 5 mg by mouth daily.    Marland Kitchen  gabapentin (NEURONTIN) 300 MG capsule Take 300 mg by mouth 2 (two) times daily.   1  . HYDROcodone-acetaminophen (NORCO) 7.5-325 MG tablet Take 1 tablet by mouth every 6 (six) hours as needed for moderate pain.    Marland Kitchen losartan (COZAAR) 50 MG tablet Take 50 mg by mouth daily.    . metFORMIN (GLUCOPHAGE) 1000 MG tablet Take 1,000 mg by mouth 2 (two) times daily with a meal.    . rosuvastatin (CRESTOR) 40 MG tablet Take 40 mg by mouth daily.    . tamsulosin (FLOMAX) 0.4 MG CAPS capsule Take 0.4 mg by mouth daily.    . traZODone (DESYREL) 100 MG tablet Take 100 mg by mouth at bedtime.     Marland Kitchen diltiazem  (CARDIZEM) 30 MG tablet Take 1 tablet (30 mg total) by mouth 2 (two) times daily. 60 tablet 6   No current facility-administered medications for this visit.   Allergies:  Patient has no known allergies.   Social History: The patient  reports that he has been smoking cigarettes. He started smoking about 59 years ago. He has been smoking about 1.00 pack per day. He has never used smokeless tobacco. He reports current alcohol use. He reports current drug use. Drug: Marijuana.   Family History: The patient's family history includes Cancer in his mother and sister; Diabetes in his father.   ROS:  Please see the history of present illness. Otherwise, complete review of systems is positive for none.  All other systems are reviewed and negative.   Physical Exam: VS:  BP 104/74   Pulse (!) 56   Ht 5\' 10"  (1.778 m)   Wt 155 lb (70.3 kg)   SpO2 94%   BMI 22.24 kg/m , BMI Body mass index is 22.24 kg/m.  Wt Readings from Last 3 Encounters:  07/08/20 155 lb (70.3 kg)  01/05/20 154 lb (69.9 kg)  01/03/20 150 lb (68 kg)    General: Patient appears comfortable at rest. Neck: Supple, no elevated JVP or carotid bruits, no thyromegaly. Lungs: Clear to auscultation, nonlabored breathing at rest. Cardiac: Regular rate and rhythm, no S3 or significant systolic murmur, no pericardial rub. Extremities: No pitting edema, distal pulses 2+. Skin: Warm and dry. Musculoskeletal: No kyphosis. Neuropsychiatric: Alert and oriented x3, affect grossly appropriate.  ECG:    Recent Labwork: No results found for requested labs within last 8760 hours.     Component Value Date/Time   CHOL 118 05/18/2016 0442   TRIG 143 05/18/2016 0442   HDL 31 (L) 05/18/2016 0442   CHOLHDL 3.8 05/18/2016 0442   VLDL 29 05/18/2016 0442   LDLCALC 58 05/18/2016 0442    Other Studies Reviewed Today:   Diagnostic Studies 04/2016 echo Study Conclusions  - Left ventricle: Turbulent flow through LVOT. Systolic  anterior motio of MV leaflets Peak gradient through LV/LVOT /AV is 15 mm Hg at rest No signifiant obstruction at rest. The cavity size was normal. Wall thickness was increased in a pattern of mild LVH. Systolic function was vigorous. The estimated ejection fraction was in the range of 65% to 70%. Doppler parameters are consistent with abnormal left ventricular relaxation (grade 1 diastolic dysfunction). - Aortic valve: AV is thickened, calcified with minimally restricted motion. - Mitral valve: SAM of mitral valve leaflets. Calcified annulus. Mildly thickened leaflets . - Left atrium: The atrium was mildly dilated. - Pulmonary arteries: PA peak pressure: 32 mm Hg (S).  04/2016 CT PE IMPRESSION: 1. No evidence of acute pulmonary embolism. 2. Mild  cardiomegaly. Left ventricular hypertrophy. Mild aortic valvular calcification. 3. Moderate three-vessel coronary atherosclerosis and mild aortic atherosclerosis. 4. Mild circumferential wall thickening involving the upper thoracic esophagus and of the distal esophagus. Query esophagitis as no distinct mass is identified. Upper endoscopy may be confirmatory. 5. COPD/emphysema. No acute cardiopulmonary disease.    04/2016 Nuclear stress  There was no ST segment deviation noted during stress.  No T wave inversion was noted during stress.  The study is normal.  This is a low risk study.  The left ventricular ejection fraction is hyperdynamic (>65%).   01/2018 ABIs FINDINGS: Right ABI: 0.98  Left ABI: 0.92  Right Lower Extremity: Normal arterial waveforms at the ankle.  Left Lower Extremity: Normal arterial waveforms at the ankle.  IMPRESSION: 1. Borderline normal bilateral ABIs. Consider postexercise testing to exclude occult arterial occlusive disease.    Assessment and Plan:  1. Paroxysmal atrial fibrillation (HCC)   2. CAD in native artery   3. Essential hypertension, benign   4.  Smoker    1. Paroxysmal atrial fibrillation (HCC) Denies any recent palpitations or arrhythmias.  He states his carvedilol seems to be giving him some chest discomfort and left upper chest after he takes it.  Please stop carvedilol and start Cardizem 30 mg p.o. twice daily.  Continue Eliquis 5 mg p.o. twice daily.  2. CAD in native artery Denies any anginal or exertional symptoms.  CT scan on 05/21/2016 showed moderate three-vessel coronary artery atherosclerosis and mild aortic atherosclerosis.  Continue Crestor 40 mg daily.  3. Essential hypertension, benign Blood pressure is well controlled.  Continue losartan 50 mg daily.  4.  Smoker History of long-term smoking.  Reinforced need to stop smoking.  Patient is aware of the implications of continuing to smoke.  Medication Adjustments/Labs and Tests Ordered: Current medicines are reviewed at length with the patient today.  Concerns regarding medicines are outlined above.   Disposition: Follow-up with Dr. Harl Bowie or APP 1 month  Signed, Levell July, NP 07/08/2020 10:44 AM    [facility] at [place], [place], [place] [address] Phone: [phone]; Fax: [phone]

## 2020-07-08 ENCOUNTER — Ambulatory Visit (INDEPENDENT_AMBULATORY_CARE_PROVIDER_SITE_OTHER): Payer: No Typology Code available for payment source | Admitting: Family Medicine

## 2020-07-08 ENCOUNTER — Encounter: Payer: Self-pay | Admitting: Family Medicine

## 2020-07-08 VITALS — BP 104/74 | HR 56 | Ht 70.0 in | Wt 155.0 lb

## 2020-07-08 DIAGNOSIS — I480 Paroxysmal atrial fibrillation: Secondary | ICD-10-CM

## 2020-07-08 DIAGNOSIS — F17200 Nicotine dependence, unspecified, uncomplicated: Secondary | ICD-10-CM

## 2020-07-08 DIAGNOSIS — I10 Essential (primary) hypertension: Secondary | ICD-10-CM | POA: Diagnosis not present

## 2020-07-08 DIAGNOSIS — I2510 Atherosclerotic heart disease of native coronary artery without angina pectoris: Secondary | ICD-10-CM | POA: Diagnosis not present

## 2020-07-08 MED ORDER — DILTIAZEM HCL 30 MG PO TABS: 30.0000 mg | ORAL_TABLET | Freq: Two times a day (BID) | ORAL | 6 refills | Status: DC

## 2020-07-08 MED ORDER — DILTIAZEM HCL 30 MG PO TABS: 30.0000 mg | ORAL_TABLET | Freq: Two times a day (BID) | ORAL | 1 refills | Status: DC

## 2020-07-08 NOTE — Patient Instructions (Signed)
Medication Instructions:   Stop Coreg (Carvedilol).    Begin Cardizem 30mg  twice a day.    Continue all other medications.    Labwork: none  Testing/Procedures: none  Follow-Up: 1 month   Any Other Special Instructions Will Be Listed Below (If Applicable).  If you need a refill on your cardiac medications before your next appointment, please call your pharmacy.

## 2020-07-10 DIAGNOSIS — H5015 Alternating exotropia: Secondary | ICD-10-CM | POA: Diagnosis not present

## 2020-07-19 ENCOUNTER — Ambulatory Visit: Payer: Medicare Other | Admitting: Cardiology

## 2020-07-22 DIAGNOSIS — Z961 Presence of intraocular lens: Secondary | ICD-10-CM | POA: Diagnosis not present

## 2020-07-22 DIAGNOSIS — H532 Diplopia: Secondary | ICD-10-CM | POA: Diagnosis not present

## 2020-07-22 DIAGNOSIS — H401111 Primary open-angle glaucoma, right eye, mild stage: Secondary | ICD-10-CM | POA: Diagnosis not present

## 2020-07-22 DIAGNOSIS — E119 Type 2 diabetes mellitus without complications: Secondary | ICD-10-CM | POA: Diagnosis not present

## 2020-08-06 DIAGNOSIS — M2548 Effusion, other site: Secondary | ICD-10-CM | POA: Diagnosis not present

## 2020-08-06 DIAGNOSIS — R42 Dizziness and giddiness: Secondary | ICD-10-CM | POA: Diagnosis not present

## 2020-08-06 DIAGNOSIS — I10 Essential (primary) hypertension: Secondary | ICD-10-CM | POA: Diagnosis not present

## 2020-08-06 DIAGNOSIS — I480 Paroxysmal atrial fibrillation: Secondary | ICD-10-CM | POA: Diagnosis not present

## 2020-08-06 DIAGNOSIS — R21 Rash and other nonspecific skin eruption: Secondary | ICD-10-CM | POA: Diagnosis not present

## 2020-08-06 DIAGNOSIS — R079 Chest pain, unspecified: Secondary | ICD-10-CM | POA: Diagnosis not present

## 2020-08-06 DIAGNOSIS — I739 Peripheral vascular disease, unspecified: Secondary | ICD-10-CM | POA: Diagnosis not present

## 2020-08-06 DIAGNOSIS — H748X3 Other specified disorders of middle ear and mastoid, bilateral: Secondary | ICD-10-CM | POA: Diagnosis not present

## 2020-08-06 DIAGNOSIS — I959 Hypotension, unspecified: Secondary | ICD-10-CM | POA: Diagnosis not present

## 2020-08-07 NOTE — Progress Notes (Addendum)
Cardiology Office Note  Date: 08/08/2020   ID: Nicolas Berry, DOB Nov 25, 1945, MRN [id number]  PCP:  [facility] [place]  Cardiologist:  Carlyle Dolly, MD Electrophysiologist:  None   Chief Complaint: Atrial fibrillation, anemia, melena, dyspnea on exertion  History of Present Illness: Nicolas Berry is a 74 y.o. male with a history of atrial fibrillation, COPD, HLD, HTN, DM 2, BPH, arthritis, fatty liver disease, CAD, dizziness, leg pain.  Last saw Dr. Harl Bowie on 01/05/2020.  Atrial fibrillation rate was controlled with Coreg and Eliquis was continued for stroke prevention.  Taking Coreg 3.125 mg p.o. twice daily.  No bleeding on Eliquis.  Mild chest pain at times usually occurring when taking pills at night.  Walks regularly on his job without troubles.  Nuclear stress test 2017 showed no evidence of ischemia.  Emphysematous changes noted on recent CT and followed by PCP.  Issues with dizziness had been seen by neurologist.  Was weaning his Neurontin.  Had an upcoming MRI.  Leg pain occurring at rest.  ABIs on 01/2018 showed right ABI 0.98, left ABI 0.92.  He was compliant with his antihypertensive medication.   Last visit on July 08, 2020 for 78-month follow-up.  He stated he was having  some issues with his carvedilol.  He stated each time he took his carvedilol he had some chest pain in the left precordial area which did not radiate and was not bothersome.  He had no other associated symptoms other than the chest pain.  He stated he was not sure this was the cause but seemed to correlate to around the time he took his carvedilol.  He denied any palpitations or arrhythmias.  He has chronic dizziness.  Having some issues with balance.  States the [place] has done all sorts of neurological diagnostic studies which have not found anything to explain the balance/dizziness.  Stated he would be going soon to a pediatric ophthalmologist to check his eyes.  He had been having some blurred  vision.  He does have diabetes and states it is reasonably well controlled.  Blood pressure was well controlled with blood pressure  of 104/74.  Heart rate of 56.  Denied any CVA or TIA-like symptoms, PND, orthopnea, bleeding issues, DVT or PE-like symptoms.  States he has some leg pain mostly on the right due to sciatica and radiates down his right leg.  No classic claudication-like symptoms.  He continued to smoke.  Recent presentation to [facility] ED 08/06/2020 for shortness of breath, dizziness, hypotension, and inability to walk due to leg pain.  Associated with feeling weak, tired, and having bilateral leg cramping.  Stated he has longstanding dizziness/vertigo.  However over the prior few days it was worse than it had as ever been.  Stated he had experienced inability to walk with these types of episodes in the past.  EKG showed sinus rhythm with premature atrial complexes, otherwise normal EKG.  Chest x-ray showed no active disease.  CT of head with contrast demonstrated chronic small vessel disease, stable.  No acute intracranial abnormality, stable bilateral mastoid effusions, left greater than right.  Diagnosis was PAF and vertigo.  proBNP was 191.  Hemoglobin 9.1 and hematocrit 27.8.  Magnesium 1.5.  He presents today for follow-up stating he feels significantly tired with some dizziness.  Denies any near-syncope or syncopal episodes.   He has been taking meclizine since recent ER visit but states it does not appear to be helping.  He stopped his Cardizem due  to believing this may have caused him some increased dizziness and hypotension leading to his recent visit to ED.  States he has been having some dark tarry stools over the last month and a half.  Admits to increasing shortness of breath.  He had a recent CT of the head due to the above-mentioned complaints with evidence of chronic small vessel disease which was stable.  No acute intracranial abnormality.  Stable bilateral mastoid effusions  left greater than right.  Patient states he has fluid behind his right ear and is possibly getting tubes.  Past Medical History:  Diagnosis Date  . Anxiety    OA multiple joints  . Arthritis   . Atrial fibrillation ([place])   . BPH (benign prostatic hyperplasia)   . COPD (chronic obstructive pulmonary disease) ([place])   . DDD (degenerative disc disease), lumbar   . Diabetes mellitus without complication ([place])   . Fatty liver   . GERD (gastroesophageal reflux disease)   . Hyperlipidemia   . Hypertension     Past Surgical History:  Procedure Laterality Date  . BACK SURGERY    . HERNIA REPAIR      Current Outpatient Medications  Medication Sig Dispense Refill  . acarbose (PRECOSE) 100 MG tablet Take 100 mg by mouth daily.     Marland Kitchen apixaban (ELIQUIS) 5 MG TABS tablet Take 1 tablet (5 mg total) by mouth 2 (two) times daily. 60 tablet 1  . budesonide-formoterol (SYMBICORT) 160-4.5 MCG/ACT inhaler Inhale 2 puffs into the lungs 2 (two) times daily. As needed 1 Inhaler 3  . finasteride (PROSCAR) 5 MG tablet Take 5 mg by mouth daily.    Marland Kitchen gabapentin (NEURONTIN) 300 MG capsule Take 300 mg by mouth 2 (two) times daily.   1  . HYDROcodone-acetaminophen (NORCO) 7.5-325 MG tablet Take 1 tablet by mouth every 6 (six) hours as needed for moderate pain.    Marland Kitchen losartan (COZAAR) 50 MG tablet Take 50 mg by mouth daily.    . metFORMIN (GLUCOPHAGE) 1000 MG tablet Take 1,000 mg by mouth 2 (two) times daily with a meal.    . rosuvastatin (CRESTOR) 40 MG tablet Take 40 mg by mouth daily.    . tamsulosin (FLOMAX) 0.4 MG CAPS capsule Take 0.4 mg by mouth daily.    . traZODone (DESYREL) 100 MG tablet Take 100 mg by mouth at bedtime.     Marland Kitchen diltiazem (CARDIZEM) 30 MG tablet Take 1 tablet (30 mg total) by mouth 2 (two) times daily. (Patient not taking: Reported on 08/08/2020) 180 tablet 1   No current facility-administered medications for this visit.   Allergies:  Patient has no known allergies.   Social History:  The patient  reports that he has been smoking cigarettes. He started smoking about 60 years ago. He has been smoking about 1.00 pack per day. He has never used smokeless tobacco. He reports current alcohol use. He reports current drug use. Drug: Marijuana.   Family History: The patient's family history includes Cancer in his mother and sister; Diabetes in his father.   ROS:  Please see the history of present illness. Otherwise, complete review of systems is positive for none.  All other systems are reviewed and negative.   Physical Exam: VS:  BP 122/60 (BP Location: Left Arm, Cuff Size: Normal)   Pulse 76   Ht 5\' 10"  (1.778 m)   Wt 153 lb 9.6 oz (69.7 kg)   SpO2 99%   BMI 22.04 kg/m , BMI Body mass  index is 22.04 kg/m.  Wt Readings from Last 3 Encounters:  08/08/20 153 lb 9.6 oz (69.7 kg)  07/08/20 155 lb (70.3 kg)  01/05/20 154 lb (69.9 kg)    General: Patient appears comfortable at rest. Neck: Supple, no elevated JVP or carotid bruits, no thyromegaly. Lungs: Clear to auscultation, nonlabored breathing at rest. Cardiac: Regular rate and rhythm, no S3 or significant systolic murmur, no pericardial rub. Extremities: No pitting edema, distal pulses 2+. Skin: Warm and dry. Musculoskeletal: No kyphosis. Neuropsychiatric: Alert and oriented x3, affect grossly appropriate.  ECG: 08/08/2020 EKG demonstrates normal sinus rhythm with a rate of 75.  Recent Labwork: No results found for requested labs within last 8760 hours.     Component Value Date/Time   CHOL 118 05/18/2016 0442   TRIG 143 05/18/2016 0442   HDL 31 (L) 05/18/2016 0442   CHOLHDL 3.8 05/18/2016 0442   VLDL 29 05/18/2016 0442   LDLCALC 58 05/18/2016 0442    Other Studies Reviewed Today:   Diagnostic Studies 04/2016 echo Study Conclusions  - Left ventricle: Turbulent flow through LVOT. Systolic anterior motio of MV leaflets Peak gradient through LV/LVOT /AV is 15 mm Hg at rest No signifiant obstruction at  rest. The cavity size was normal. Wall thickness was increased in a pattern of mild LVH. Systolic function was vigorous. The estimated ejection fraction was in the range of 65% to 70%. Doppler parameters are consistent with abnormal left ventricular relaxation (grade 1 diastolic dysfunction). - Aortic valve: AV is thickened, calcified with minimally restricted motion. - Mitral valve: SAM of mitral valve leaflets. Calcified annulus. Mildly thickened leaflets . - Left atrium: The atrium was mildly dilated. - Pulmonary arteries: PA peak pressure: 32 mm Hg (S).  04/2016 CT PE IMPRESSION: 1. No evidence of acute pulmonary embolism. 2. Mild cardiomegaly. Left ventricular hypertrophy. Mild aortic valvular calcification. 3. Moderate three-vessel coronary atherosclerosis and mild aortic atherosclerosis. 4. Mild circumferential wall thickening involving the upper thoracic esophagus and of the distal esophagus. Query esophagitis as no distinct mass is identified. Upper endoscopy may be confirmatory. 5. COPD/emphysema. No acute cardiopulmonary disease.    04/2016 Nuclear stress  There was no ST segment deviation noted during stress.  No T wave inversion was noted during stress.  The study is normal.  This is a low risk study.  The left ventricular ejection fraction is hyperdynamic (>65%).   01/2018 ABIs FINDINGS: Right ABI: 0.98  Left ABI: 0.92  Right Lower Extremity: Normal arterial waveforms at the ankle.  Left Lower Extremity: Normal arterial waveforms at the ankle.  IMPRESSION: 1. Borderline normal bilateral ABIs. Consider postexercise testing to exclude occult arterial occlusive disease.    Assessment and Plan:   1. Paroxysmal atrial fibrillation (HCC) Denies any recent palpitations or arrhythmias.  EKG today demonstrates normal sinus rhythm rate of 75.  States he has had some recent dark tarry stools over the last month and a half.   Recent hemoglobin was low at 9.1 and hematocrit 27.8.  Advised to restart carvedilol at 3.125 mg p.o. twice daily.  Stop Cardizem.  Stop Eliquis for now due to black tarry stools.  2. CAD in native artery Denies any anginal or exertional symptoms.  CT scan on 05/21/2016 showed moderate three-vessel coronary artery atherosclerosis and mild aortic atherosclerosis.  Continue Crestor 40 mg daily.  3. Essential hypertension, benign Blood pressure is well controlled.  Continue losartan 50 mg daily.  4.  Smoker History of long-term smoking.  Reinforced need to stop smoking.  Patient is aware of the implications of continuing to smoke.  5.  Anemia Recent hospital visit for dizziness, shortness of breath, hypotension.  He is complaining of significant fatigue.  Lab work showed from recent ED visit demonstrated hemoglobin of 9.1 with hematocrit 27.8.  Patient states he has had some black tarry stools for 1.5 months.  We are stopping Eliquis for now.  Please refer to [facility] for GI bleeding.  6.  DOE Complaining of progressively worsening dyspnea on exertion over the last few months. Last echocardiogram August 2017 demonstrated turbulent flow through the LVOT with   Systolic anterior motion mitral valve leaflets.  Please get a repeat echocardiogram.  Medication Adjustments/Labs and Tests Ordered: Current medicines are reviewed at length with the patient today.  Concerns regarding medicines are outlined above.   Disposition: Follow-up with Dr. Harl Bowie or APP 6 weeks  Signed, Levell July, NP 08/08/2020 10:56 AM    [facility] at [place], [place], [place] [address] Phone: [phone]; Fax: [phone]

## 2020-08-08 ENCOUNTER — Ambulatory Visit (INDEPENDENT_AMBULATORY_CARE_PROVIDER_SITE_OTHER): Payer: No Typology Code available for payment source | Admitting: Family Medicine

## 2020-08-08 ENCOUNTER — Encounter: Payer: Self-pay | Admitting: Family Medicine

## 2020-08-08 VITALS — BP 122/60 | HR 76 | Ht 70.0 in | Wt 153.6 lb

## 2020-08-08 DIAGNOSIS — R0602 Shortness of breath: Secondary | ICD-10-CM | POA: Diagnosis not present

## 2020-08-08 DIAGNOSIS — K921 Melena: Secondary | ICD-10-CM

## 2020-08-08 DIAGNOSIS — I480 Paroxysmal atrial fibrillation: Secondary | ICD-10-CM | POA: Diagnosis not present

## 2020-08-08 MED ORDER — CARVEDILOL 3.125 MG PO TABS: 3.1250 mg | ORAL_TABLET | Freq: Two times a day (BID) | ORAL | 6 refills | Status: DC

## 2020-08-08 NOTE — Patient Instructions (Addendum)
Medication Instructions:   Stop Eliquis for now.   Stop Cardizem.   Resume your Coreg 3.125mg  twice a day.  Continue all other medications.    Labwork: none  Testing/Procedures:  Your physician has requested that you have an echocardiogram. Echocardiography is a painless test that uses sound waves to create images of your heart. It provides your doctor with information about the size and shape of your heart and how well your heart's chambers and valves are working. This procedure takes approximately one hour. There are no restrictions for this procedure.  Office will contact with results via phone or letter.    Follow-Up: 6 weeks   Any Other Special Instructions Will Be Listed Below (If Applicable). You have been referred to:  GI   If you need a refill on your cardiac medications before your next appointment, please call your pharmacy.

## 2020-08-13 ENCOUNTER — Ambulatory Visit (HOSPITAL_COMMUNITY)
Admission: RE | Admit: 2020-08-13 | Discharge: 2020-08-13 | Disposition: A | Discharge status: Home / Self Care | Payer: Medicare Other | Source: Ambulatory Visit | Attending: Family Medicine | Admitting: Family Medicine

## 2020-08-13 ENCOUNTER — Other Ambulatory Visit: Payer: Self-pay

## 2020-08-13 DIAGNOSIS — R0602 Shortness of breath: Secondary | ICD-10-CM | POA: Diagnosis not present

## 2020-08-13 LAB — ECHOCARDIOGRAM COMPLETE
AR max vel: 2.85 cm2
AV Area VTI: 2.64 cm2
AV Area mean vel: 2.59 cm2
AV Mean grad: 9 mmHg
AV Peak grad: 16.5 mmHg
Ao pk vel: 2.03 m/s
Area-P 1/2: 2.74 cm2
MV M vel: 5.15 m/s
MV Peak grad: 106.1 mmHg
S' Lateral: 2 cm

## 2020-08-13 NOTE — Progress Notes (Signed)
*  PRELIMINARY RESULTS* Echocardiogram 2D Echocardiogram has been performed.  Nicolas Berry 08/13/2020, 1:56 PM

## 2020-08-20 ENCOUNTER — Telehealth: Payer: Self-pay | Admitting: *Deleted

## 2020-08-20 NOTE — Telephone Encounter (Signed)
Laurine Blazer, LPN  32/75/5623 9:21 PM EST Back to Top    Notified, copy to pcp.

## 2020-08-20 NOTE — Telephone Encounter (Signed)
Call placed to patient today to notify of Echo results.    He c/o of having low blood pressure & dizziness.  Only had one number he could give which was 90/?.   Suggested that he keep BP & HR log for the next 2 weeks & bring by office for provider review.  Would not want to change any medications based on one reading.    Also, stated that since stopping the Eliquis, the bleeding has stopped.  Advised him to follow through with his GI referral as this still needs to be evaluated.  He verbalized understanding.

## 2020-08-20 NOTE — Telephone Encounter (Signed)
-----   Message from Verta Ellen., NP sent at 08/17/2020  9:26 PM EST ----- Please call the patient and let him know the echocardiogram showed he has good pumping function of his heart. The left ventricle is a little more muscular than normal. Best management is keep BP at or below 130/80. Has some mild leaking of the mitral valve and trivial leaking of the aortic valve. This is commonly associated with aging.

## 2020-08-21 ENCOUNTER — Telehealth: Payer: Self-pay | Admitting: *Deleted

## 2020-08-21 NOTE — Telephone Encounter (Signed)
Patient notified via detailed voice message.

## 2020-08-21 NOTE — Telephone Encounter (Signed)
-----   Message from Patrecia Pour sent at 08/20/2020  3:58 PM EST ----- Regarding: RE: gi referral Patient was to call the [employer] to get a referral - we can not schedule without authorization if he wants the [employer] to pay    ----- Message ----- From: Laurine Blazer, LPN Sent: 11/57/2620   2:43 PM EST To: Patrecia Pour Subject: gi referral                                    Wanted to see if you guys were working on this for patient - getting [facility] authorization.    I just spoke with patient in regards to his Echo results & he was asking about this.  Just checking as courtesy to the patient.  Thanks,    [place]

## 2020-09-11 ENCOUNTER — Ambulatory Visit (INDEPENDENT_AMBULATORY_CARE_PROVIDER_SITE_OTHER): Payer: No Typology Code available for payment source | Admitting: Gastroenterology

## 2020-09-11 ENCOUNTER — Other Ambulatory Visit: Payer: Self-pay

## 2020-09-11 ENCOUNTER — Encounter: Payer: Self-pay | Admitting: Gastroenterology

## 2020-09-11 VITALS — BP 118/67 | HR 68 | Temp 97.1°F | Ht 70.0 in | Wt 158.2 lb

## 2020-09-11 DIAGNOSIS — D500 Iron deficiency anemia secondary to blood loss (chronic): Secondary | ICD-10-CM

## 2020-09-11 DIAGNOSIS — K219 Gastro-esophageal reflux disease without esophagitis: Secondary | ICD-10-CM

## 2020-09-11 DIAGNOSIS — K921 Melena: Secondary | ICD-10-CM

## 2020-09-11 DIAGNOSIS — R1310 Dysphagia, unspecified: Secondary | ICD-10-CM

## 2020-09-11 DIAGNOSIS — K5900 Constipation, unspecified: Secondary | ICD-10-CM | POA: Diagnosis not present

## 2020-09-11 DIAGNOSIS — D509 Iron deficiency anemia, unspecified: Secondary | ICD-10-CM | POA: Insufficient documentation

## 2020-09-11 MED ORDER — PANTOPRAZOLE SODIUM 40 MG PO TBEC: 40.0000 mg | DELAYED_RELEASE_TABLET | Freq: Every day | ORAL | 3 refills | Status: DC

## 2020-09-11 MED ORDER — PANTOPRAZOLE SODIUM 40 MG PO TBEC: 40.0000 mg | DELAYED_RELEASE_TABLET | Freq: Every day | ORAL | 0 refills | Status: DC

## 2020-09-11 NOTE — H&P (View-Only) (Signed)
Primary Care Physician:  [facility]  Primary Gastroenterologist:  Charles K. Carver, DO   Chief Complaint  Patient presents with  . Colonoscopy    Never had TCS. Had black stools, cardiologist stopped Eliquis approx 1 month ago. Stool no longer black. [facility] with [facility] week ago. [facility] called him yesterday and said he was anemic and will need to start Iron.    HPI:  Nicolas Berry is a 74 y.o. male here at the request of [facility] for further evaluation of blood in the stools. Original referral came back over the summer for Heme + stool, however patient never completed appointment. He reports having very dark stools for about one month. Stopped Eliquis last month at request of cardiology and stools cleared up. Cardiology also stopped his cardiazem. He has had some constipation with straining. [facility] put him on a laxative powder (likely Miralax) which has been effective, stools became loose but remained black last month). Denies Pepto use. Recently prescribed iron but has not received medication from the [facility] yet.   No red blood. Stools more regular now. History of acid reflux years ago but had done well up until recently. He has had several episodes of waking up at night with nausea and burning in chest.   He has had chronic dizziness. Saw ENT yesterday. Exam unremarkable. Patient states hs has sensation of thick mucous drainage in back of throat that makes it hard to swallow. He is on Flonase. He also is having eye surgery for lazy eye next month. Feels his dizziness may be multifactorial.   Patient complains of progressive fatigue and SOB/DOE over the past two months. Chronic smoker but did not appreciate any SOB until two months ago.   Labs from 07/2020: H/H 9.1/27.8, MCV 96.5, WBC 7300, platelets 105000, AST 45, alb 3.7, BUN 22, creatinine 1.17, magnesium 1.5 (1.6-2.6 normal).  Labs from 09/03/20: BUN 10, Creatinine 1.14, AO 51, AST 29, ALT 44 normal, Tbili 0.4, iron 19L, WBC 9460,  H/H 8.5, HCT 29.3, Platelet 142000, INR 1.1, B12 431, ferritin 6.   Current Outpatient Medications  Medication Sig Dispense Refill  . acarbose (PRECOSE) 100 MG tablet Take 100 mg by mouth daily.     . carvedilol (COREG) 3.125 MG tablet Take 1 tablet (3.125 mg total) by mouth 2 (two) times daily. 60 tablet 6  . Cholecalciferol 25 MCG (1000 UT) tablet Take 1,000 Units by mouth daily.    . finasteride (PROSCAR) 5 MG tablet Take 5 mg by mouth daily.    . gabapentin (NEURONTIN) 300 MG capsule Take 300 mg by mouth 2 (two) times daily.   1  . HYDROcodone-acetaminophen (NORCO) 7.5-325 MG tablet Take 1 tablet by mouth 4 (four) times daily.    . losartan (COZAAR) 50 MG tablet Take 50 mg by mouth daily.    . metFORMIN (GLUCOPHAGE) 1000 MG tablet Take 1,000 mg by mouth 2 (two) times daily with a meal.    . rosuvastatin (CRESTOR) 40 MG tablet Take 40 mg by mouth daily.    . tamsulosin (FLOMAX) 0.4 MG CAPS capsule Take 0.4 mg by mouth daily.    . traZODone (DESYREL) 100 MG tablet Take 100 mg by mouth at bedtime.     . budesonide-formoterol (SYMBICORT) 160-4.5 MCG/ACT inhaler Inhale 2 puffs into the lungs 2 (two) times daily. As needed (Patient not taking: Reported on 09/11/2020) 1 Inhaler 3   No current facility-administered medications for this visit.    Allergies as   of 09/11/2020  . (No Known Allergies)    Past Medical History:  Diagnosis Date  . Anxiety    OA multiple joints  . Arthritis   . Atrial fibrillation ([place])   . BPH (benign prostatic hyperplasia)   . COPD (chronic obstructive pulmonary disease) ([place])   . DDD (degenerative disc disease), lumbar   . Diabetes mellitus without complication ([place])   . Fatty liver   . GERD (gastroesophageal reflux disease)   . Hyperlipidemia   . Hypertension     Past Surgical History:  Procedure Laterality Date  . BACK SURGERY     C6-C7  . HERNIA REPAIR     left inguinal  . TONSILLECTOMY      Family History  Problem Relation Age of Onset  .  Cancer Mother        bladder  . Diabetes Father   . Cancer Sister        breast   . Colon cancer Neg Hx     Social History   Socioeconomic History  . Marital status: Married    Spouse name: Not on file  . Number of children: 3  . Years of education: Not on file  . Highest education level: Not on file  Occupational History  . Occupation: retired  Tobacco Use  . Smoking status: Current Every Day Smoker    Packs/day: 1.00    Types: Cigarettes    Start date: 08/06/1960  . Smokeless tobacco: Never Used  Vaping Use  . Vaping Use: Never used  Substance and Sexual Activity  . Alcohol use: Not Currently    Comment: denied 09/11/20  . Drug use: Not Currently    Types: Marijuana    Comment: denied 09/11/20  . Sexual activity: Not on file  Other Topics Concern  . Not on file  Social History Narrative  . Not on file   Social Determinants of Health   Financial Resource Strain: Not on file  Food Insecurity: Not on file  Transportation Needs: Not on file  Physical Activity: Not on file  Stress: Not on file  Social Connections: Not on file  Intimate Partner Violence: Not on file      ROS:  General: Negative for anorexia, weight loss, fever, chills,+ fatigue, +weakness. Eyes: Negative for vision changes.  ENT: Negative for hoarseness,  nasal congestion. See hpi CV: Negative for chest pain, angina, palpitations,+ dyspnea on exertion, +peripheral edema.  Respiratory: Negative for dyspnea at rest, +dyspnea on exertion, cough, sputum, wheezing.  GI: See history of present illness. GU:  Negative for dysuria, hematuria, urinary incontinence, urinary frequency, nocturnal urination.  MS: Negative for joint pain, low back pain.  Derm: Negative for rash or itching.  Neuro: Negative for weakness, abnormal sensation, seizure, frequent headaches, memory loss, confusion.  Psych: Negative for anxiety, depression, suicidal ideation, hallucinations.  Endo: Negative for unusual weight  change.  Heme: Negative for bruising or bleeding. Allergy: Negative for rash or hives.    Physical Examination:  BP 118/67   Pulse 68   Temp (!) 97.1 F (36.2 C) (Temporal)   Ht 5\' 10"  (1.778 m)   Wt 158 lb 3.2 oz (71.8 kg)   BMI 22.70 kg/m    General: Well-nourished, well-developed in no acute distress.  Head: Normocephalic, atraumatic.   Eyes: Conjunctiva pink, no icterus. Mouth: masked. Neck: Supple without thyromegaly, masses, or lymphadenopathy.  Lungs: Clear to auscultation bilaterally.  Heart: Regular rate and rhythm, no murmurs rubs or gallops.  Abdomen: Bowel sounds  are normal, nontender, nondistended, no hepatosplenomegaly or masses, no abdominal bruits or    hernia , no rebound or guarding.   Rectal: not performed Extremities: No lower extremity edema. No clubbing or deformities.  Neuro: Alert and oriented x 4 , grossly normal neurologically.  Skin: Warm and dry, no rash or jaundice.   Psych: Alert and cooperative, normal mood and affect.  Labs:  See hpi  Imaging Studies: ECHOCARDIOGRAM COMPLETE  Result Date: 08/13/2020    ECHOCARDIOGRAM REPORT   Patient Name:   Nicolas Berry Date of Exam: 08/13/2020 Medical Rec #:  [id number]        Height:       70.0 in Accession #:    [id number]       Weight:       153.6 lb Date of Birth:  [birth year]       BSA:          1.866 m Patient Age:    75 years         BP:           116/62 mmHg Patient Gender: M                HR:           74 bpm. Exam Location:  [facility] Procedure: 2D Echo, Cardiac Doppler and Color Doppler Indications:    R06.02 (ICD-10-CM) - SOB (shortness of breath)  History:        Patient has prior history of Echocardiogram examinations, most                 recent 05/18/2016. COPD, Arrythmias:Atrial Fibrillation; Risk                 Factors:Hypertension, Diabetes, Dyslipidemia and Tobacco use.                 Anticoagulated-Eliquis started.  Sonographer:    Alvino Chapel RCS Referring Phys: [phone] Verta Ellen. IMPRESSIONS  1. Left ventricular ejection fraction, by estimation, is 60 to 65%. The left ventricle has normal function. The left ventricle has no regional wall motion abnormalities. There is moderate left ventricular hypertrophy. Left ventricular diastolic parameters were normal.  2. Right ventricular systolic function is normal. The right ventricular size is normal. There is normal pulmonary artery systolic pressure.  3. Left atrial size was mildly dilated.  4. Chordal SAM without LVOT gradient. The mitral valve is degenerative. Mild mitral valve regurgitation. No evidence of mitral stenosis. Severe mitral annular calcification.  5. The aortic valve is tricuspid. There is moderate calcification of the aortic valve. There is moderate thickening of the aortic valve. Aortic valve regurgitation is trivial. Mild aortic valve stenosis.  6. The inferior vena cava is dilated in size with >50% respiratory variability, suggesting right atrial pressure of 8 mmHg. FINDINGS  Left Ventricle: Left ventricular ejection fraction, by estimation, is 60 to 65%. The left ventricle has normal function. The left ventricle has no regional wall motion abnormalities. The left ventricular internal cavity size was normal in size. There is  moderate left ventricular hypertrophy. Left ventricular diastolic parameters were normal. Right Ventricle: The right ventricular size is normal. No increase in right ventricular wall thickness. Right ventricular systolic function is normal. There is normal pulmonary artery systolic pressure. The tricuspid regurgitant velocity is 2.58 m/s, and  with an assumed right atrial pressure of 8 mmHg, the estimated right ventricular systolic pressure is [id number] mmHg. Left Atrium: Left atrial size was mildly  dilated. Right Atrium: Right atrial size was normal in size. Pericardium: There is no evidence of pericardial effusion. Mitral Valve: Chordal SAM without LVOT gradient. The mitral valve is degenerative in  appearance. There is moderate thickening of the mitral valve leaflet(s). There is moderate calcification of the mitral valve leaflet(s). Severe mitral annular calcification. Mild mitral valve regurgitation. No evidence of mitral valve stenosis. Tricuspid Valve: The tricuspid valve is normal in structure. Tricuspid valve regurgitation is not demonstrated. No evidence of tricuspid stenosis. Aortic Valve: The aortic valve is tricuspid. There is moderate calcification of the aortic valve. There is moderate thickening of the aortic valve. There is moderate aortic valve annular calcification. Aortic valve regurgitation is trivial. Mild aortic stenosis is present. Aortic valve mean gradient measures 9.0 mmHg. Aortic valve peak gradient measures 16.5 mmHg. Aortic valve area, by VTI measures 2.64 cm. Pulmonic Valve: The pulmonic valve was normal in structure. Pulmonic valve regurgitation is not visualized. No evidence of pulmonic stenosis. Aorta: The aortic root is normal in size and structure. Venous: The inferior vena cava is dilated in size with greater than 50% respiratory variability, suggesting right atrial pressure of 8 mmHg. IAS/Shunts: No atrial level shunt detected by color flow Doppler.  LEFT VENTRICLE PLAX 2D LVIDd:         4.30 cm  Diastology LVIDs:         2.00 cm  LV e' medial:    5.87 cm/s LV PW:         1.10 cm  LV E/e' medial:  22.0 LV IVS:        1.50 cm  LV e' lateral:   5.11 cm/s LVOT diam:     2.10 cm  LV E/e' lateral: 25.2 LV SV:         130 LV SV Index:   70 LVOT Area:     3.46 cm  RIGHT VENTRICLE RV S prime:     15.20 cm/s TAPSE (M-mode): 2.0 cm LEFT ATRIUM             Index       RIGHT ATRIUM           Index LA diam:        3.90 cm 2.09 cm/m  RA Area:     19.10 cm LA Vol (A2C):   84.4 ml 45.23 ml/m RA Volume:   53.80 ml  28.83 ml/m LA Vol (A4C):   68.3 ml 36.61 ml/m LA Biplane Vol: 76.6 ml 41.05 ml/m  AORTIC VALVE AV Area (Vmax):    2.85 cm AV Area (Vmean):   2.59 cm AV Area (VTI):      2.64 cm AV Vmax:           203.00 cm/s AV Vmean:          138.000 cm/s AV VTI:            0.494 m AV Peak Grad:      16.5 mmHg AV Mean Grad:      9.0 mmHg LVOT Vmax:         167.00 cm/s LVOT Vmean:        103.000 cm/s LVOT VTI:          0.376 m LVOT/AV VTI ratio: 0.76  AORTA Ao Root diam: 3.50 cm MITRAL VALVE                TRICUSPID VALVE MV Area (PHT): 2.74 cm     TR Peak grad:   26.6 mmHg   MV Decel Time: 277 msec     TR Vmax:        258.00 cm/s MR Peak grad: 106.1 mmHg MR Mean grad: 70.0 mmHg     SHUNTS MR Vmax:      515.00 cm/s   Systemic VTI:  0.38 m MR Vmean:     377.0 cm/s    Systemic Diam: 2.10 cm MV E velocity: 129.00 cm/s MV A velocity: 121.00 cm/s MV E/A ratio:  1.07 Jenkins Rouge MD Electronically signed by Jenkins Rouge MD Signature Date/Time: 08/13/2020/2:54:03 PM    Final    Impression/Plan:  74 y/o male with history of Afib on Eliquis, HTN, DM, COPD presenting for further evaluation of IDA, heme + stool, and history of melena. Original referral back in 01/2020 for colonoscopy due to heme + stool but patient did not follow through with visit.   IDA: with heme + stool dating back six months and black stools as outlined above. suspect acute on chronic GI bleeding. No black stools in one month since off Eliquis. He has had some constipation, some recent recurrent nocturnal reflux type symptoms. Complains of some vague dysphagia. His IDA could be from bleeding anywhere along the GI track. Cannot exclude gastritis/PUD/AVMS/malignancy. Avoid NSAIDs as much as possible. Start pantoprazole 40mg  daily before breakfast. Plan for colonoscopy and EGD+/-ED with Dr. Abbey Chatters in near future. ASA III.  I have discussed the risks, alternatives, benefits with regards to but not limited to the risk of reaction to medication, bleeding, infection, perforation and the patient is agreeable to proceed. Written consent to be obtained.  Fatigue/SOB: likely exacerbated by his IDA. Most recent Hgb 8.5. He will start iron  when he receives it. If worsening fatigue, SOB, chest pain, recurrent black stools advise him to go to ER. Will recheck H/H in 1-2 weeks.   Constipation: continue miralax equivalent on daily basis as needed to maintain soft regular BMs.  Patient reports one month history of black stool while on Eliquis but has resolved since Eliquis stopped last month.

## 2020-09-11 NOTE — Progress Notes (Signed)
Primary Care Physician:  [facility] [place]  Primary Gastroenterologist:  Elon Alas. Abbey Chatters, DO   Chief Complaint  Patient presents with  . Colonoscopy    Never had TCS. Had black stools, cardiologist stopped Eliquis approx 1 month ago. Stool no longer black. [facility]. [facility] yesterday and said he was anemic and will need to start Iron.    HPI:  Nicolas Berry is a 74 y.o. male here at the request of [facility] for further evaluation of blood in the stools. Original referral came back over the summer for Heme + stool, however patient never completed appointment. He reports having very dark stools for about one month. Stopped Eliquis last month at request of cardiology and stools cleared up. Cardiology also stopped his cardiazem. He has had some constipation with straining. [facility] put him on a laxative powder (likely Miralax) which has been effective, stools became loose but remained black last month). Denies Pepto use. Recently prescribed iron but has not received medication from the [place] yet.   No red blood. Stools more regular now. History of acid reflux years ago but had done well up until recently. He has had several episodes of waking up at night with nausea and burning in chest.   He has had chronic dizziness. Saw ENT yesterday. Exam unremarkable. Patient states hs has sensation of thick mucous drainage in back of throat that makes it hard to swallow. He is on Flonase. He also is having eye surgery for lazy eye next month. Feels his dizziness may be multifactorial.   Patient complains of progressive fatigue and SOB/DOE over the past two months. Chronic smoker but did not appreciate any SOB until two months ago.   Labs from 07/2020: H/H 9.1/27.8, MCV 96.5, WBC 7300, platelets 105000, AST 45, alb 3.7, BUN 22, creatinine 1.17, magnesium 1.5 (1.6-2.6 normal).  Labs from 09/03/20: BUN 10, Creatinine 1.14, AO 51, AST 29, ALT 44 normal, Tbili 0.4, iron 19L, WBC 9460,  H/H 8.5, HCT 29.3, Platelet 142000, INR 1.1, B12 431, ferritin 6.   Current Outpatient Medications  Medication Sig Dispense Refill  . acarbose (PRECOSE) 100 MG tablet Take 100 mg by mouth daily.     . carvedilol (COREG) 3.125 MG tablet Take 1 tablet (3.125 mg total) by mouth 2 (two) times daily. 60 tablet 6  . Cholecalciferol 25 MCG (1000 UT) tablet Take 1,000 Units by mouth daily.    . finasteride (PROSCAR) 5 MG tablet Take 5 mg by mouth daily.    Marland Kitchen gabapentin (NEURONTIN) 300 MG capsule Take 300 mg by mouth 2 (two) times daily.   1  . HYDROcodone-acetaminophen (NORCO) 7.5-325 MG tablet Take 1 tablet by mouth 4 (four) times daily.    Marland Kitchen losartan (COZAAR) 50 MG tablet Take 50 mg by mouth daily.    . metFORMIN (GLUCOPHAGE) 1000 MG tablet Take 1,000 mg by mouth 2 (two) times daily with a meal.    . rosuvastatin (CRESTOR) 40 MG tablet Take 40 mg by mouth daily.    . tamsulosin (FLOMAX) 0.4 MG CAPS capsule Take 0.4 mg by mouth daily.    . traZODone (DESYREL) 100 MG tablet Take 100 mg by mouth at bedtime.     . budesonide-formoterol (SYMBICORT) 160-4.5 MCG/ACT inhaler Inhale 2 puffs into the lungs 2 (two) times daily. As needed (Patient not taking: Reported on 09/11/2020) 1 Inhaler 3   No current facility-administered medications for this visit.    Allergies as  of 09/11/2020  . (No Known Allergies)    Past Medical History:  Diagnosis Date  . Anxiety    OA multiple joints  . Arthritis   . Atrial fibrillation ([place])   . BPH (benign prostatic hyperplasia)   . COPD (chronic obstructive pulmonary disease) ([place])   . DDD (degenerative disc disease), lumbar   . Diabetes mellitus without complication ([place])   . Fatty liver   . GERD (gastroesophageal reflux disease)   . Hyperlipidemia   . Hypertension     Past Surgical History:  Procedure Laterality Date  . BACK SURGERY     C6-C7  . HERNIA REPAIR     left inguinal  . TONSILLECTOMY      Family History  Problem Relation Age of Onset  .  Cancer Mother        bladder  . Diabetes Father   . Cancer Sister        breast   . Colon cancer Neg Hx     Social History   Socioeconomic History  . Marital status: Married    Spouse name: Not on file  . Number of children: 3  . Years of education: Not on file  . Highest education level: Not on file  Occupational History  . Occupation: retired  Tobacco Use  . Smoking status: Current Every Day Smoker    Packs/day: 1.00    Types: Cigarettes    Start date: 08/06/1960  . Smokeless tobacco: Never Used  Vaping Use  . Vaping Use: Never used  Substance and Sexual Activity  . Alcohol use: Not Currently    Comment: denied 09/11/20  . Drug use: Not Currently    Types: Marijuana    Comment: denied 09/11/20  . Sexual activity: Not on file  Other Topics Concern  . Not on file  Social History Narrative  . Not on file   Social Determinants of Health   Financial Resource Strain: Not on file  Food Insecurity: Not on file  Transportation Needs: Not on file  Physical Activity: Not on file  Stress: Not on file  Social Connections: Not on file  Intimate Partner Violence: Not on file      ROS:  General: Negative for anorexia, weight loss, fever, chills,+ fatigue, +weakness. Eyes: Negative for vision changes.  ENT: Negative for hoarseness,  nasal congestion. See hpi CV: Negative for chest pain, angina, palpitations,+ dyspnea on exertion, +peripheral edema.  Respiratory: Negative for dyspnea at rest, +dyspnea on exertion, cough, sputum, wheezing.  GI: See history of present illness. GU:  Negative for dysuria, hematuria, urinary incontinence, urinary frequency, nocturnal urination.  MS: Negative for joint pain, low back pain.  Derm: Negative for rash or itching.  Neuro: Negative for weakness, abnormal sensation, seizure, frequent headaches, memory loss, confusion.  Psych: Negative for anxiety, depression, suicidal ideation, hallucinations.  Endo: Negative for unusual weight  change.  Heme: Negative for bruising or bleeding. Allergy: Negative for rash or hives.    Physical Examination:  BP 118/67   Pulse 68   Temp (!) 97.1 F (36.2 C) (Temporal)   Ht 5\' 10"  (1.778 m)   Wt 158 lb 3.2 oz (71.8 kg)   BMI 22.70 kg/m    General: Well-nourished, well-developed in no acute distress.  Head: Normocephalic, atraumatic.   Eyes: Conjunctiva pink, no icterus. Mouth: masked. Neck: Supple without thyromegaly, masses, or lymphadenopathy.  Lungs: Clear to auscultation bilaterally.  Heart: Regular rate and rhythm, no murmurs rubs or gallops.  Abdomen: Bowel sounds  are normal, nontender, nondistended, no hepatosplenomegaly or masses, no abdominal bruits or    hernia , no rebound or guarding.   Rectal: not performed Extremities: No lower extremity edema. No clubbing or deformities.  Neuro: Alert and oriented x 4 , grossly normal neurologically.  Skin: Warm and dry, no rash or jaundice.   Psych: Alert and cooperative, normal mood and affect.  Labs:  See hpi  Imaging Studies: ECHOCARDIOGRAM COMPLETE  Result Date: 08/13/2020    ECHOCARDIOGRAM REPORT   Patient Name:   GRANTLAND MUNNS Date of Exam: 08/13/2020 Medical Rec #:  [id number]        Height:       70.0 in Accession #:    [id number]       Weight:       153.6 lb Date of Birth:  07-06-1946       BSA:          1.866 m Patient Age:    65 years         BP:           116/62 mmHg Patient Gender: M                HR:           74 bpm. Exam Location:  [facility] Procedure: 2D Echo, Cardiac Doppler and Color Doppler Indications:    R06.02 (ICD-10-CM) - SOB (shortness of breath)  History:        Patient has prior history of Echocardiogram examinations, most                 recent 05/18/2016. COPD, Arrythmias:Atrial Fibrillation; Risk                 Factors:Hypertension, Diabetes, Dyslipidemia and Tobacco use.                 Anticoagulated-Eliquis started.  Sonographer:    Alvino Chapel RCS Referring Phys: [phone] Verta Ellen. IMPRESSIONS  1. Left ventricular ejection fraction, by estimation, is 60 to 65%. The left ventricle has normal function. The left ventricle has no regional wall motion abnormalities. There is moderate left ventricular hypertrophy. Left ventricular diastolic parameters were normal.  2. Right ventricular systolic function is normal. The right ventricular size is normal. There is normal pulmonary artery systolic pressure.  3. Left atrial size was mildly dilated.  4. Chordal SAM without LVOT gradient. The mitral valve is degenerative. Mild mitral valve regurgitation. No evidence of mitral stenosis. Severe mitral annular calcification.  5. The aortic valve is tricuspid. There is moderate calcification of the aortic valve. There is moderate thickening of the aortic valve. Aortic valve regurgitation is trivial. Mild aortic valve stenosis.  6. The inferior vena cava is dilated in size with >50% respiratory variability, suggesting right atrial pressure of 8 mmHg. FINDINGS  Left Ventricle: Left ventricular ejection fraction, by estimation, is 60 to 65%. The left ventricle has normal function. The left ventricle has no regional wall motion abnormalities. The left ventricular internal cavity size was normal in size. There is  moderate left ventricular hypertrophy. Left ventricular diastolic parameters were normal. Right Ventricle: The right ventricular size is normal. No increase in right ventricular wall thickness. Right ventricular systolic function is normal. There is normal pulmonary artery systolic pressure. The tricuspid regurgitant velocity is 2.58 m/s, and  with an assumed right atrial pressure of 8 mmHg, the estimated right ventricular systolic pressure is [id number] mmHg. Left Atrium: Left atrial size was mildly  dilated. Right Atrium: Right atrial size was normal in size. Pericardium: There is no evidence of pericardial effusion. Mitral Valve: Chordal SAM without LVOT gradient. The mitral valve is degenerative in  appearance. There is moderate thickening of the mitral valve leaflet(s). There is moderate calcification of the mitral valve leaflet(s). Severe mitral annular calcification. Mild mitral valve regurgitation. No evidence of mitral valve stenosis. Tricuspid Valve: The tricuspid valve is normal in structure. Tricuspid valve regurgitation is not demonstrated. No evidence of tricuspid stenosis. Aortic Valve: The aortic valve is tricuspid. There is moderate calcification of the aortic valve. There is moderate thickening of the aortic valve. There is moderate aortic valve annular calcification. Aortic valve regurgitation is trivial. Mild aortic stenosis is present. Aortic valve mean gradient measures 9.0 mmHg. Aortic valve peak gradient measures 16.5 mmHg. Aortic valve area, by VTI measures 2.64 cm. Pulmonic Valve: The pulmonic valve was normal in structure. Pulmonic valve regurgitation is not visualized. No evidence of pulmonic stenosis. Aorta: The aortic root is normal in size and structure. Venous: The inferior vena cava is dilated in size with greater than 50% respiratory variability, suggesting right atrial pressure of 8 mmHg. IAS/Shunts: No atrial level shunt detected by color flow Doppler.  LEFT VENTRICLE PLAX 2D LVIDd:         4.30 cm  Diastology LVIDs:         2.00 cm  LV e' medial:    5.87 cm/s LV PW:         1.10 cm  LV E/e' medial:  22.0 LV IVS:        1.50 cm  LV e' lateral:   5.11 cm/s LVOT diam:     2.10 cm  LV E/e' lateral: 25.2 LV SV:         130 LV SV Index:   70 LVOT Area:     3.46 cm  RIGHT VENTRICLE RV S prime:     15.20 cm/s TAPSE (M-mode): 2.0 cm LEFT ATRIUM             Index       RIGHT ATRIUM           Index LA diam:        3.90 cm 2.09 cm/m  RA Area:     19.10 cm LA Vol (A2C):   84.4 ml 45.23 ml/m RA Volume:   53.80 ml  28.83 ml/m LA Vol (A4C):   68.3 ml 36.61 ml/m LA Biplane Vol: 76.6 ml 41.05 ml/m  AORTIC VALVE AV Area (Vmax):    2.85 cm AV Area (Vmean):   2.59 cm AV Area (VTI):      2.64 cm AV Vmax:           203.00 cm/s AV Vmean:          138.000 cm/s AV VTI:            0.494 m AV Peak Grad:      16.5 mmHg AV Mean Grad:      9.0 mmHg LVOT Vmax:         167.00 cm/s LVOT Vmean:        103.000 cm/s LVOT VTI:          0.376 m LVOT/AV VTI ratio: 0.76  AORTA Ao Root diam: 3.50 cm MITRAL VALVE                TRICUSPID VALVE MV Area (PHT): 2.74 cm     TR Peak grad:   26.6 mmHg  MV Decel Time: 277 msec     TR Vmax:        258.00 cm/s MR Peak grad: 106.1 mmHg MR Mean grad: 70.0 mmHg     SHUNTS MR Vmax:      515.00 cm/s   Systemic VTI:  0.38 m MR Vmean:     377.0 cm/s    Systemic Diam: 2.10 cm MV E velocity: 129.00 cm/s MV A velocity: 121.00 cm/s MV E/A ratio:  1.07 Jenkins Rouge MD Electronically signed by Jenkins Rouge MD Signature Date/Time: 08/13/2020/2:54:03 PM    Final    Impression/Plan:  74 y/o male with history of Afib on Eliquis, HTN, DM, COPD presenting for further evaluation of IDA, heme + stool, and history of melena. Original referral back in 01/2020 for colonoscopy due to heme + stool but patient did not follow through with visit.   IDA: with heme + stool dating back six months and black stools as outlined above. suspect acute on chronic GI bleeding. No black stools in one month since off Eliquis. He has had some constipation, some recent recurrent nocturnal reflux type symptoms. Complains of some vague dysphagia. His IDA could be from bleeding anywhere along the GI track. Cannot exclude gastritis/PUD/AVMS/malignancy. Avoid NSAIDs as much as possible. Start pantoprazole 40mg  daily before breakfast. Plan for colonoscopy and EGD+/-ED with Dr. Abbey Chatters in near future. ASA III.  I have discussed the risks, alternatives, benefits with regards to but not limited to the risk of reaction to medication, bleeding, infection, perforation and the patient is agreeable to proceed. Written consent to be obtained.  Fatigue/SOB: likely exacerbated by his IDA. Most recent Hgb 8.5. He will start iron  when he receives it. If worsening fatigue, SOB, chest pain, recurrent black stools advise him to go to ER. Will recheck H/H in 1-2 weeks.   Constipation: continue miralax equivalent on daily basis as needed to maintain soft regular BMs.  Patient reports one month history of black stool while on Eliquis but has resolved since Eliquis stopped last month.

## 2020-09-11 NOTE — Patient Instructions (Addendum)
1. Take your laxative powder prescribed by the [place] on regular basis as per instructions on the bottle. You can hold for diarrhea. 2. Please go for labs in 1-2 weeks to follow up on anemia.  3. If you have worsening fatigue, dizziness, lightheadedness, shortness of breath, chest pain, black stools go to the ER. 4. Minimize use of ibuprofen (Advil), naproxen (Aleve) at this time due to anemia/bleeding. 5. Start pantoprazole 40mg  daily for now. This will help with reflux symptoms but if you have gastritis or ulcer contributing to your anemia, this will help heal them.   6. Colonoscopy and upper endoscopy as scheduled. See separate instructions.

## 2020-09-12 NOTE — Progress Notes (Signed)
Cc'ed to pcp °

## 2020-09-18 LAB — CBC WITH DIFFERENTIAL/PLATELET
Basophils Absolute: 0.1 10*3/uL (ref 0.0–0.2)
Basos: 1 %
EOS (ABSOLUTE): 0.3 10*3/uL (ref 0.0–0.4)
Eos: 3 %
Hematocrit: 29.8 % — ABNORMAL LOW (ref 37.5–51.0)
Hemoglobin: 8.7 g/dL — ABNORMAL LOW (ref 13.0–17.7)
Immature Grans (Abs): 0 10*3/uL (ref 0.0–0.1)
Immature Granulocytes: 0 %
Lymphocytes Absolute: 2.2 10*3/uL (ref 0.7–3.1)
Lymphs: 25 %
MCH: 24.3 pg — ABNORMAL LOW (ref 26.6–33.0)
MCHC: 29.2 g/dL — ABNORMAL LOW (ref 31.5–35.7)
MCV: 83 fL (ref 79–97)
Monocytes Absolute: 0.9 10*3/uL (ref 0.1–0.9)
Monocytes: 10 %
Neutrophils Absolute: 5.3 10*3/uL (ref 1.4–7.0)
Neutrophils: 61 %
Platelets: 173 10*3/uL (ref 150–450)
RBC: 3.58 x10E6/uL — ABNORMAL LOW (ref 4.14–5.80)
RDW: 18 % — ABNORMAL HIGH (ref 11.6–15.4)
WBC: 8.7 10*3/uL (ref 3.4–10.8)

## 2020-09-18 NOTE — Progress Notes (Signed)
Cardiology Office Note  Date: 09/19/2020   ID: Nicolas Berry, DOB 1946-07-25, MRN [id number]  PCP:  [facility]  Cardiologist:  Dina Rich, MD Electrophysiologist:  None   Chief Complaint: Atrial fibrillation, anemia, melena, dyspnea on exertion  History of Present Illness: Nicolas Berry is a 74 y.o. male with a history of atrial fibrillation, COPD, HLD, HTN, DM 2, BPH, arthritis, fatty liver disease, CAD, dizziness, leg pain.  Last saw Dr. Wyline Mood on 01/05/2020.  Atrial fibrillation rate was controlled with Coreg and Eliquis was continued for stroke prevention.  Taking Coreg 3.125 mg p.o. twice daily.  No bleeding on Eliquis.  Mild chest pain at times usually occurring when taking pills at night.  Walks regularly on his job without troubles.  Nuclear stress test 2017 showed no evidence of ischemia.  Emphysematous changes noted on recent CT and followed by PCP.  Issues with dizziness had been seen by neurologist.  Was weaning his Neurontin.  Had an upcoming MRI.  Leg pain occurring at rest.  ABIs on 01/2018 showed right ABI 0.98, left ABI 0.92.  He was compliant with his antihypertensive medication.   Last visit on July 08, 2020 for 67-month follow-up.  He stated he was having  some issues with his carvedilol.  He stated each time he took his carvedilol he had some chest pain in the left precordial area which did not radiate and was not bothersome.  He had no other associated symptoms other than the chest pain.  He stated he was not sure this was the cause but seemed to correlate to around the time he took his carvedilol.  He denied any palpitations or arrhythmias.  He has chronic dizziness.  Having some issues with balance.  States the [place] has done all sorts of neurological diagnostic studies which have not found anything to explain the balance/dizziness.  Stated he would be going soon to a pediatric ophthalmologist to check his eyes.  He had been having some blurred  vision.  He does have diabetes and states it is reasonably well controlled.  Blood pressure was well controlled with blood pressure  of 104/74.  Heart rate of 56.  Denied any CVA or TIA-like symptoms, PND, orthopnea, bleeding issues, DVT or PE-like symptoms.  States he has some leg pain mostly on the right due to sciatica and radiates down his right leg.  No classic claudication-like symptoms.  He continued to smoke.  Recent presentation to [facility] ED 08/06/2020 for shortness of breath, dizziness, hypotension, and inability to walk due to leg pain.  Associated with feeling weak, tired, and having bilateral leg cramping.  Stated he has longstanding dizziness/vertigo.  However over the prior few days it was worse than it had as ever been.  Stated he had experienced inability to walk with these types of episodes in the past.  EKG showed sinus rhythm with premature atrial complexes, otherwise normal EKG.  Chest x-ray showed no active disease.  CT of head with contrast demonstrated chronic small vessel disease, stable.  No acute intracranial abnormality, stable bilateral mastoid effusions, left greater than right.  Diagnosis was PAF and vertigo.  proBNP was 191.  Hemoglobin 9.1 and hematocrit 27.8.  Magnesium 1.5.  He presented the last visit stating he felt significantly tired with some dizziness.  Denies any near-syncope or syncopal episodes.  He had been taking meclizine since recent ER visit but stated it did not appear to be helping.  He stopped his Cardizem due to  believing this may have caused him some increased dizziness and hypotension leading to his recent visit to ED. that he had been having some dark tarry stools over the prior month and a half.  Admitted to increasing shortness of breath.  He had a recent CT of the head due to the above-mentioned complaints with evidence of chronic small vessel disease which was stable.  No acute intracranial abnormality.  Stable bilateral mastoid effusions left  greater than right.  Stated he had fluid behind his right ear and is possibly getting tubes.  At last visit we stopped his Eliquis due to complaints of dark tarry stools.  His recent hemoglobin and hematocrit as of 09/17/2020 was 8.7 and 29.8.  He has a pending EGD and colonoscopy schedule 10/01/2020 with Dr. Abbey Chatters.  States he feels significantly weak but since stopping his Eliquis his black tarry stools have cleared up and he has normal-appearing stools.  He also has upcoming surgery in late January also.  States his legs feel weak and he has not been sleeping well.  He takes trazodone but states it does not seem to be helping very much with his sleep.  Blood pressure is low normal today.   Past Medical History:  Diagnosis Date  . Anxiety    OA multiple joints  . Arthritis   . Atrial fibrillation ([place])   . BPH (benign prostatic hyperplasia)   . COPD (chronic obstructive pulmonary disease) ([place])   . DDD (degenerative disc disease), lumbar   . Diabetes mellitus without complication ([place])   . Fatty liver   . GERD (gastroesophageal reflux disease)   . Hyperlipidemia   . Hypertension     Past Surgical History:  Procedure Laterality Date  . BACK SURGERY     C6-C7  . HERNIA REPAIR     left inguinal  . TONSILLECTOMY      Current Outpatient Medications  Medication Sig Dispense Refill  . acarbose (PRECOSE) 100 MG tablet Take 100 mg by mouth daily.     . carvedilol (COREG) 3.125 MG tablet Take 1 tablet (3.125 mg total) by mouth 2 (two) times daily. 60 tablet 6  . Cholecalciferol 25 MCG (1000 UT) tablet Take 1,000 Units by mouth daily.    . finasteride (PROSCAR) 5 MG tablet Take 5 mg by mouth daily.    Marland Kitchen gabapentin (NEURONTIN) 300 MG capsule Take 300 mg by mouth 2 (two) times daily.   1  . HYDROcodone-acetaminophen (NORCO) 7.5-325 MG tablet Take 1 tablet by mouth 4 (four) times daily.    Marland Kitchen losartan (COZAAR) 50 MG tablet Take 50 mg by mouth daily.    . metFORMIN (GLUCOPHAGE) 1000 MG tablet  Take 1,000 mg by mouth 2 (two) times daily with a meal.    . pantoprazole (PROTONIX) 40 MG tablet Take 1 tablet (40 mg total) by mouth daily before breakfast. 90 tablet 3  . rosuvastatin (CRESTOR) 40 MG tablet Take 40 mg by mouth daily.    . traZODone (DESYREL) 100 MG tablet Take 100 mg by mouth at bedtime.     . tamsulosin (FLOMAX) 0.4 MG CAPS capsule Take 0.4 mg by mouth daily.     No current facility-administered medications for this visit.   Allergies:  Patient has no known allergies.   Social History: The patient  reports that he has been smoking cigarettes. He started smoking about 60 years ago. He has been smoking about 1.00 pack per day. He has never used smokeless tobacco. He reports previous alcohol  use. He reports previous drug use. Drug: Marijuana.   Family History: The patient's family history includes Cancer in his mother and sister; Diabetes in his father.   ROS:  Please see the history of present illness. Otherwise, complete review of systems is positive for none.  All other systems are reviewed and negative.   Physical Exam: VS:  BP 112/60   Pulse 75   Resp 16   Ht 5\' 10"  (1.778 m)   Wt 159 lb 12.8 oz (72.5 kg)   SpO2 98%   BMI 22.93 kg/m , BMI Body mass index is 22.93 kg/m.  Wt Readings from Last 3 Encounters:  09/19/20 159 lb 12.8 oz (72.5 kg)  09/11/20 158 lb 3.2 oz (71.8 kg)  08/08/20 153 lb 9.6 oz (69.7 kg)    General: Patient appears comfortable at rest. Neck: Supple, no elevated JVP or carotid bruits, no thyromegaly. Lungs: Clear to auscultation, nonlabored breathing at rest. Cardiac: Regular rate and rhythm, no S3 or significant systolic murmur, no pericardial rub. Extremities: No pitting edema, distal pulses 2+. Skin: Warm and dry. Musculoskeletal: No kyphosis. Neuropsychiatric: Alert and oriented x3, affect grossly appropriate.  ECG: 08/08/2020 EKG demonstrates normal sinus rhythm with a rate of 75.  Recent Labwork: 09/17/2020: Hemoglobin 8.7;  Platelets 173     Component Value Date/Time   CHOL 118 05/18/2016 0442   TRIG 143 05/18/2016 0442   HDL 31 (L) 05/18/2016 0442   CHOLHDL 3.8 05/18/2016 0442   VLDL 29 05/18/2016 0442   LDLCALC 58 05/18/2016 0442    Other Studies Reviewed Today:  Echocardiogram 08/13/2020 1. Left ventricular ejection fraction, by estimation, is 60 to 65%. The left ventricle has normal function. The left ventricle has no regional wall motion abnormalities. There is moderate left ventricular hypertrophy. Left ventricular diastolic parameters were normal. 2. Right ventricular systolic function is normal. The right ventricular size is normal. There is normal pulmonary artery systolic pressure. 3. Left atrial size was mildly dilated. 4. Chordal SAM without LVOT gradient. The mitral valve is degenerative. Mild mitral valve regurgitation. No evidence of mitral stenosis. Severe mitral annular calcification. 5. The aortic valve is tricuspid. There is moderate calcification of the aortic valve. There is moderate thickening of the aortic valve. Aortic valve regurgitation is trivial. Mild aortic valve stenosis. 6. The inferior vena cava is dilated in size with >50% respiratory variability, suggesting right atrial pressure of 8 mmHg.   Diagnostic Studies 04/2016 echo Study Conclusions  - Left ventricle: Turbulent flow through LVOT. Systolic anterior motio of MV leaflets Peak gradient through LV/LVOT /AV is 15 mm Hg at rest No signifiant obstruction at rest. The cavity size was normal. Wall thickness was increased in a pattern of mild LVH. Systolic function was vigorous. The estimated ejection fraction was in the range of 65% to 70%. Doppler parameters are consistent with abnormal left ventricular relaxation (grade 1 diastolic dysfunction). - Aortic valve: AV is thickened, calcified with minimally restricted motion. - Mitral valve: SAM of mitral valve leaflets. Calcified  annulus. Mildly thickened leaflets . - Left atrium: The atrium was mildly dilated. - Pulmonary arteries: PA peak pressure: 32 mm Hg (S).  04/2016 CT PE IMPRESSION: 1. No evidence of acute pulmonary embolism. 2. Mild cardiomegaly. Left ventricular hypertrophy. Mild aortic valvular calcification. 3. Moderate three-vessel coronary atherosclerosis and mild aortic atherosclerosis. 4. Mild circumferential wall thickening involving the upper thoracic esophagus and of the distal esophagus. Query esophagitis as no distinct mass is identified. Upper endoscopy may be confirmatory. 5.  COPD/emphysema. No acute cardiopulmonary disease.    04/2016 Nuclear stress  There was no ST segment deviation noted during stress.  No T wave inversion was noted during stress.  The study is normal.  This is a low risk study.  The left ventricular ejection fraction is hyperdynamic (>65%).   01/2018 ABIs FINDINGS: Right ABI: 0.98  Left ABI: 0.92  Right Lower Extremity: Normal arterial waveforms at the ankle.  Left Lower Extremity: Normal arterial waveforms at the ankle.  IMPRESSION: 1. Borderline normal bilateral ABIs. Consider postexercise testing to exclude occult arterial occlusive disease.    Assessment and Plan:   1. Paroxysmal atrial fibrillation (HCC) Denies any recent palpitations or arrhythmias.  Heart rate today is 75 and regular.  At prior visit he stated he had some recent dark tarry stools over the prior month and a half.  Recent hemoglobin was low at 9.1 and hematocrit 27.8.  His Eliquis was stopped.  He was referred to GI.  He states since stopping the Eliquis his black tarry stools have cleared up.  He has pending EGD and colonoscopy with Dr. Abbey Chatters on January 11.  Recent repeat CBC showed further decrease in H&H with hemoglobin 8.7 and hematocrit of 29.8.  He was advised to restart carvedilol at 3.125 mg p.o. twice daily.     2. CAD in native artery Denies  any anginal or exertional symptoms.  CT scan on 05/21/2016 showed moderate three-vessel coronary artery atherosclerosis and mild aortic atherosclerosis.  Continue Crestor 40 mg daily.  3. Essential hypertension, benign Blood pressure is well controlled.  BP 112/60 today.  Advised him to decrease losartan to 25 mg daily for now.   4.  Smoker History of long-term smoking.  Reinforced need to stop smoking.  Patient is aware of the implications of continuing to smoke.  5.  Anemia Recent hospital visit for dizziness, shortness of breath, hypotension.  He is complaining of significant fatigue.  Lab work showed from recent ED visit demonstrated hemoglobin of 9.1 with hematocrit 27.8.  Recent CBC on 09/17/2020 demonstrated hemoglobin of 8.7 and hematocrit 29.8.  He has an upcoming EGD and colonoscopy with Dr. Abbey Chatters on 10/01/2020.  He states his dark tarry stools have cleared up since stopping Eliquis.    6.  DOE At last visit he complained of progressively worsening dyspnea on exertion over the last few months. Last echocardiogram August 2017 demonstrated turbulent flow through the LVOT with  systolic anterior motion mitral valve leaflets.  Repeat echocardiogram 08/13/2020 demonstrated EF of 60 to 65%.  No WMA's.  Moderate LVH normal diastolic parameters.  LA was mildly dilated.:  S.A.M. without LVOT gradient.  Severe mitral annular calcification.  Moderate calcification of aortic valve.  Trivial AR mild AS.  DOE is likely related to significant anemia.  He had a significant drop in his hemoglobin from 4 years ago when it was 16 now down to 8.7.  Medication Adjustments/Labs and Tests Ordered: Current medicines are reviewed at length with the patient today.  Concerns regarding medicines are outlined above.   Disposition: Follow-up with Dr. Harl Bowie or APP 1 month Signed, Levell July, NP 09/19/2020 11:02 AM    [facility] at [address] [place], [place], [place] [address] Phone: [phone]; Fax: [phone]

## 2020-09-19 ENCOUNTER — Other Ambulatory Visit: Payer: Self-pay

## 2020-09-19 ENCOUNTER — Encounter: Payer: Self-pay | Admitting: Family Medicine

## 2020-09-19 ENCOUNTER — Ambulatory Visit (INDEPENDENT_AMBULATORY_CARE_PROVIDER_SITE_OTHER): Payer: No Typology Code available for payment source | Admitting: Family Medicine

## 2020-09-19 VITALS — BP 112/60 | HR 75 | Resp 16 | Ht 70.0 in | Wt 159.8 lb

## 2020-09-19 DIAGNOSIS — F17200 Nicotine dependence, unspecified, uncomplicated: Secondary | ICD-10-CM

## 2020-09-19 DIAGNOSIS — I10 Essential (primary) hypertension: Secondary | ICD-10-CM | POA: Diagnosis not present

## 2020-09-19 DIAGNOSIS — R0609 Other forms of dyspnea: Secondary | ICD-10-CM

## 2020-09-19 DIAGNOSIS — R0600 Dyspnea, unspecified: Secondary | ICD-10-CM

## 2020-09-19 DIAGNOSIS — I480 Paroxysmal atrial fibrillation: Secondary | ICD-10-CM | POA: Diagnosis not present

## 2020-09-19 DIAGNOSIS — I2510 Atherosclerotic heart disease of native coronary artery without angina pectoris: Secondary | ICD-10-CM | POA: Diagnosis not present

## 2020-09-19 DIAGNOSIS — D649 Anemia, unspecified: Secondary | ICD-10-CM | POA: Diagnosis not present

## 2020-09-19 MED ORDER — LOSARTAN POTASSIUM 25 MG PO TABS: 25.0000 mg | ORAL_TABLET | Freq: Every day | ORAL | 3 refills | Status: DC

## 2020-09-19 NOTE — Patient Instructions (Signed)
Medication Instructions:  Your physician has recommended you make the following change in your medication:  1.  REDUCE the Losartan to 25 mg daily.  You may take 1/2 of the 50 mg tablets you already have at home.   *If you need a refill on your cardiac medications before your next appointment, please call your pharmacy*   Lab Work: TODAY:  GO TO [facility] TO HAVE BLOOD WORK  If you have labs (blood work) drawn today and your tests are completely normal, you will receive your results only by: Marland Kitchen MyChart Message (if you have MyChart) OR . A paper copy in the mail If you have any lab test that is abnormal or we need to change your treatment, we will call you to review the results.   Testing/Procedures: None ordered   Follow-Up: At [facility], you and your health needs are our priority.  As part of our continuing mission to provide you with exceptional heart care, we have created designated Provider Care Teams.  These Care Teams include your primary Cardiologist (physician) and Advanced Practice Providers (APPs -  Physician Assistants and Nurse Practitioners) who all work together to provide you with the care you need, when you need it.  We recommend signing up for the patient portal called "MyChart".  Sign up information is provided on this After Visit Summary.  MyChart is used to connect with patients for Virtual Visits (Telemedicine).  Patients are able to view lab/test results, encounter notes, upcoming appointments, etc.  Non-urgent messages can be sent to your provider as well.   To learn more about what you can do with MyChart, go to [email or web address].    Your next appointment:   1 month(s)  The format for your next appointment:   In Person  Provider:   Nena Polio, NP  Other Instructions

## 2020-09-25 ENCOUNTER — Ambulatory Visit: Payer: Self-pay | Admitting: Ophthalmology

## 2020-09-25 NOTE — Patient Instructions (Signed)
Nicolas Berry  09/25/2020     @PREFPERIOPPHARMACY @   Your procedure is scheduled on  10/01/2020.  Report to 11/29/2020 at  1215  A.M.  Call this number if you have problems the morning of surgery:  [phone]   Remember:  Follow the diet and prep instructions given to you by the office.                       Take these medicines the morning of surgery with A SIP OF WATER  Carvedilol, cymbalta, gabapentin, hydrocodone(if needed), protonix.    Do not wear jewelry, make-up or nail polish.  Do not wear lotions, powders, or perfume. Please wear deodorant and brush your teeth.  Do not shave 48 hours prior to surgery.  Men may shave face and neck.  Do not bring valuables to the hospital.  [facility] is not responsible for any belongings or valuables.  Contacts, dentures or bridgework may not be worn into surgery.  Leave your suitcase in the car.  After surgery it may be brought to your room.  For patients admitted to the hospital, discharge time will be determined by your treatment team.  Patients discharged the day of surgery will not be allowed to drive home.   Name and phone number of your driver:   family Special instructions:  DO NOT smoke the morning of your procedure.  Please read over the following fact sheets that you were given. Anesthesia Post-op Instructions and Care and Recovery After Surgery       Upper Endoscopy, Adult, Care After This sheet gives you information about how to care for yourself after your procedure. Your health care provider may also give you more specific instructions. If you have problems or questions, contact your health care provider. What can I expect after the procedure? After the procedure, it is common to have:  A sore throat.  Mild stomach pain or discomfort.  Bloating.  Nausea. Follow these instructions at home:   Follow instructions from your health care provider about what to eat or drink after your  procedure.  Return to your normal activities as told by your health care provider. Ask your health care provider what activities are safe for you.  Take over-the-counter and prescription medicines only as told by your health care provider.  Do not drive for 24 hours if you were given a sedative during your procedure.  Keep all follow-up visits as told by your health care provider. This is important. Contact a health care provider if you have:  A sore throat that lasts longer than one day.  Trouble swallowing. Get help right away if:  You vomit blood or your vomit looks like coffee grounds.  You have: ? A fever. ? Bloody, black, or tarry stools. ? A severe sore throat or you cannot swallow. ? Difficulty breathing. ? Severe pain in your chest or abdomen. Summary  After the procedure, it is common to have a sore throat, mild stomach discomfort, bloating, and nausea.  Do not drive for 24 hours if you were given a sedative during the procedure.  Follow instructions from your health care provider about what to eat or drink after your procedure.  Return to your normal activities as told by your health care provider. This information is not intended to replace advice given to you by your health care provider. Make sure you discuss any questions you have with  your health care provider. Document Revised: 03/01/2018 Document Reviewed: 02/07/2018 Elsevier Patient Education  [place].  Esophageal Dilatation Esophageal dilatation, also called esophageal dilation, is a procedure to widen or open (dilate) a blocked or narrowed part of the esophagus. The esophagus is the part of the body that moves food and liquid from the mouth to the stomach. You may need this procedure if:  You have a buildup of scar tissue in your esophagus that makes it difficult, painful, or impossible to swallow. This can be caused by gastroesophageal reflux disease (GERD).  You have cancer of the  esophagus.  There is a problem with how food moves through your esophagus. In some cases, you may need this procedure repeated at a later time to dilate the esophagus gradually. Tell a health care provider about:  Any allergies you have.  All medicines you are taking, including vitamins, herbs, eye drops, creams, and over-the-counter medicines.  Any problems you or family members have had with anesthetic medicines.  Any blood disorders you have.  Any surgeries you have had.  Any medical conditions you have.  Any antibiotic medicines you are required to take before dental procedures.  Whether you are pregnant or may be pregnant. What are the risks? Generally, this is a safe procedure. However, problems may occur, including:  Bleeding due to a tear in the lining of the esophagus.  A hole (perforation) in the esophagus. What happens before the procedure?  Follow instructions from your health care provider about eating or drinking restrictions.  Ask your health care provider about changing or stopping your regular medicines. This is especially important if you are taking diabetes medicines or blood thinners.  Plan to have someone take you home from the hospital or clinic.  Plan to have a responsible adult care for you for at least 24 hours after you leave the hospital or clinic. This is important. What happens during the procedure?  You may be given a medicine to help you relax (sedative).  A numbing medicine may be sprayed into the back of your throat, or you may gargle the medicine.  Your health care provider may perform the dilatation using various surgical instruments, such as: ? Simple dilators. This instrument is carefully placed in the esophagus to stretch it. ? Guided wire bougies. This involves using an endoscope to insert a wire into the esophagus. A dilator is passed over this wire to enlarge the esophagus. Then the wire is removed. ? Balloon dilators. An endoscope  with a small balloon at the end is inserted into the esophagus. The balloon is inflated to stretch the esophagus and open it up. The procedure may vary among health care providers and hospitals. What happens after the procedure?  Your blood pressure, heart rate, breathing rate, and blood oxygen level will be monitored until the medicines you were given have worn off.  Your throat may feel slightly sore and numb. This will improve slowly over time.  You will not be allowed to eat or drink until your throat is no longer numb.  When you are able to drink, urinate, and sit on the edge of the bed without nausea or dizziness, you may be able to return home. Follow these instructions at home:  Take over-the-counter and prescription medicines only as told by your health care provider.  Do not drive for 24 hours if you were given a sedative during your procedure.  You should have a responsible adult with you for 24 hours after  the procedure.  Follow instructions from your health care provider about any eating or drinking restrictions.  Do not use any products that contain nicotine or tobacco, such as cigarettes and e-cigarettes. If you need help quitting, ask your health care provider.  Keep all follow-up visits as told by your health care provider. This is important. Get help right away if you:  Have a fever.  Have chest pain.  Have pain that is not relieved by medication.  Have trouble breathing.  Have trouble swallowing.  Vomit blood. Summary  Esophageal dilatation, also called esophageal dilation, is a procedure to widen or open (dilate) a blocked or narrowed part of the esophagus.  Plan to have someone take you home from the hospital or clinic.  For this procedure, a numbing medicine may be sprayed into the back of your throat, or you may gargle the medicine.  Do not drive for 24 hours if you were given a sedative during your procedure. This information is not intended to  replace advice given to you by your health care provider. Make sure you discuss any questions you have with your health care provider. Document Revised: 07/05/2019 Document Reviewed: 07/13/2017 Elsevier Patient Education  [place].  Colonoscopy, Adult, Care After This sheet gives you information about how to care for yourself after your procedure. Your health care provider may also give you more specific instructions. If you have problems or questions, contact your health care provider. What can I expect after the procedure? After the procedure, it is common to have:  A small amount of blood in your stool for 24 hours after the procedure.  Some gas.  Mild cramping or bloating of your abdomen. Follow these instructions at home: Eating and drinking   Drink enough fluid to keep your urine pale yellow.  Follow instructions from your health care provider about eating or drinking restrictions.  Resume your normal diet as instructed by your health care provider. Avoid heavy or fried foods that are hard to digest. Activity  Rest as told by your health care provider.  Avoid sitting for a long time without moving. Get up to take short walks every 1-2 hours. This is important to improve blood flow and breathing. Ask for help if you feel weak or unsteady.  Return to your normal activities as told by your health care provider. Ask your health care provider what activities are safe for you. Managing cramping and bloating   Try walking around when you have cramps or feel bloated.  Apply heat to your abdomen as told by your health care provider. Use the heat source that your health care provider recommends, such as a moist heat pack or a heating pad. ? Place a towel between your skin and the heat source. ? Leave the heat on for 20-30 minutes. ? Remove the heat if your skin turns bright red. This is especially important if you are unable to feel pain, heat, or cold. You may have a  greater risk of getting burned. General instructions  For the first 24 hours after the procedure: ? Do not drive or use machinery. ? Do not sign important documents. ? Do not drink alcohol. ? Do your regular daily activities at a slower pace than normal. ? Eat soft foods that are easy to digest.  Take over-the-counter and prescription medicines only as told by your health care provider.  Keep all follow-up visits as told by your health care provider. This is important. Contact a health care  provider if:  You have blood in your stool 2-3 days after the procedure. Get help right away if you have:  More than a small spotting of blood in your stool.  Large blood clots in your stool.  Swelling of your abdomen.  Nausea or vomiting.  A fever.  Increasing pain in your abdomen that is not relieved with medicine. Summary  After the procedure, it is common to have a small amount of blood in your stool. You may also have mild cramping and bloating of your abdomen.  For the first 24 hours after the procedure, do not drive or use machinery, sign important documents, or drink alcohol.  Get help right away if you have a lot of blood in your stool, nausea or vomiting, a fever, or increased pain in your abdomen. This information is not intended to replace advice given to you by your health care provider. Make sure you discuss any questions you have with your health care provider. Document Revised: 04/03/2019 Document Reviewed: 04/03/2019 Elsevier Patient Education  [place] After These instructions provide you with information about caring for yourself after your procedure. Your health care provider may also give you more specific instructions. Your treatment has been planned according to current medical practices, but problems sometimes occur. Call your health care provider if you have any problems or questions after your procedure. What can I expect  after the procedure? After your procedure, you may:  Feel sleepy for several hours.  Feel clumsy and have poor balance for several hours.  Feel forgetful about what happened after the procedure.  Have poor judgment for several hours.  Feel nauseous or vomit.  Have a sore throat if you had a breathing tube during the procedure. Follow these instructions at home: For at least 24 hours after the procedure:      Have a responsible adult stay with you. It is important to have someone help care for you until you are awake and alert.  Rest as needed.  Do not: ? Participate in activities in which you could fall or become injured. ? Drive. ? Use heavy machinery. ? Drink alcohol. ? Take sleeping pills or medicines that cause drowsiness. ? Make important decisions or sign legal documents. ? Take care of children on your own. Eating and drinking  Follow the diet that is recommended by your health care provider.  If you vomit, drink water, juice, or soup when you can drink without vomiting.  Make sure you have little or no nausea before eating solid foods. General instructions  Take over-the-counter and prescription medicines only as told by your health care provider.  If you have sleep apnea, surgery and certain medicines can increase your risk for breathing problems. Follow instructions from your health care provider about wearing your sleep device: ? Anytime you are sleeping, including during daytime naps. ? While taking prescription pain medicines, sleeping medicines, or medicines that make you drowsy.  If you smoke, do not smoke without supervision.  Keep all follow-up visits as told by your health care provider. This is important. Contact a health care provider if:  You keep feeling nauseous or you keep vomiting.  You feel light-headed.  You develop a rash.  You have a fever. Get help right away if:  You have trouble breathing. Summary  For several hours after  your procedure, you may feel sleepy and have poor judgment.  Have a responsible adult stay with you for at least 24  hours or until you are awake and alert. This information is not intended to replace advice given to you by your health care provider. Make sure you discuss any questions you have with your health care provider. Document Revised: 12/06/2017 Document Reviewed: 12/29/2015 Elsevier Patient Education  [place].

## 2020-09-27 ENCOUNTER — Encounter (HOSPITAL_COMMUNITY): Payer: Self-pay

## 2020-09-27 ENCOUNTER — Other Ambulatory Visit: Payer: Self-pay

## 2020-09-27 ENCOUNTER — Other Ambulatory Visit (HOSPITAL_COMMUNITY)
Admission: RE | Admit: 2020-09-27 | Discharge: 2020-09-27 | Disposition: A | Discharge status: Home / Self Care | Payer: No Typology Code available for payment source | Source: Ambulatory Visit | Attending: Internal Medicine | Admitting: Internal Medicine

## 2020-09-27 ENCOUNTER — Encounter (HOSPITAL_COMMUNITY)
Admission: RE | Admit: 2020-09-27 | Discharge: 2020-09-27 | Disposition: A | Discharge status: Home / Self Care | Payer: No Typology Code available for payment source | Source: Ambulatory Visit | Attending: Internal Medicine | Admitting: Internal Medicine

## 2020-09-27 DIAGNOSIS — Z01812 Encounter for preprocedural laboratory examination: Secondary | ICD-10-CM | POA: Diagnosis present

## 2020-09-27 DIAGNOSIS — Z20822 Contact with and (suspected) exposure to covid-19: Secondary | ICD-10-CM | POA: Insufficient documentation

## 2020-09-27 LAB — CBC WITH DIFFERENTIAL/PLATELET
Abs Immature Granulocytes: 0.03 10*3/uL (ref 0.00–0.07)
Basophils Absolute: 0.1 10*3/uL (ref 0.0–0.1)
Basophils Relative: 1 %
Eosinophils Absolute: 0.4 10*3/uL (ref 0.0–0.5)
Eosinophils Relative: 4 %
HCT: 32.7 % — ABNORMAL LOW (ref 39.0–52.0)
Hemoglobin: 9.3 g/dL — ABNORMAL LOW (ref 13.0–17.0)
Immature Granulocytes: 0 %
Lymphocytes Relative: 25 %
Lymphs Abs: 2.3 10*3/uL (ref 0.7–4.0)
MCH: 24.5 pg — ABNORMAL LOW (ref 26.0–34.0)
MCHC: 28.4 g/dL — ABNORMAL LOW (ref 30.0–36.0)
MCV: 86.1 fL (ref 80.0–100.0)
Monocytes Absolute: 0.9 10*3/uL (ref 0.1–1.0)
Monocytes Relative: 9 %
Neutro Abs: 5.5 10*3/uL (ref 1.7–7.7)
Neutrophils Relative %: 61 %
Platelets: 149 10*3/uL — ABNORMAL LOW (ref 150–400)
RBC: 3.8 MIL/uL — ABNORMAL LOW (ref 4.22–5.81)
RDW: 18.5 % — ABNORMAL HIGH (ref 11.5–15.5)
WBC: 9.2 10*3/uL (ref 4.0–10.5)
nRBC: 0 % (ref 0.0–0.2)

## 2020-09-27 LAB — BASIC METABOLIC PANEL
Anion gap: 11 (ref 5–15)
BUN: 19 mg/dL (ref 8–23)
CO2: 24 mmol/L (ref 22–32)
Calcium: 9.9 mg/dL (ref 8.9–10.3)
Chloride: 102 mmol/L (ref 98–111)
Creatinine, Ser: 1.18 mg/dL (ref 0.61–1.24)
GFR, Estimated: >60 mL/min (ref >60)
Glucose, Bld: 142 mg/dL — ABNORMAL HIGH (ref 70–99)
Potassium: 3.9 mmol/L (ref 3.5–5.1)
Sodium: 137 mmol/L (ref 135–145)

## 2020-09-28 LAB — SARS CORONAVIRUS 2 (TAT 6-24 HRS): SARS Coronavirus 2: NEGATIVE

## 2020-09-30 ENCOUNTER — Telehealth: Payer: Self-pay | Admitting: *Deleted

## 2020-09-30 NOTE — Telephone Encounter (Signed)
Called pt. He was agreeable to moving procedure time up tomorrow to 11:45am. Aware arrival time. He can have clear liquids until midnight and nothing after.

## 2020-10-01 ENCOUNTER — Other Ambulatory Visit: Payer: Self-pay

## 2020-10-01 ENCOUNTER — Ambulatory Visit (HOSPITAL_COMMUNITY): Payer: No Typology Code available for payment source | Admitting: Anesthesiology

## 2020-10-01 ENCOUNTER — Encounter (HOSPITAL_COMMUNITY)
Admission: RE | Disposition: A | Discharge status: Home / Self Care | Payer: Self-pay | Source: Home / Self Care | Attending: Internal Medicine

## 2020-10-01 ENCOUNTER — Encounter (HOSPITAL_COMMUNITY): Payer: Self-pay

## 2020-10-01 ENCOUNTER — Ambulatory Visit (HOSPITAL_COMMUNITY)
Admission: RE | Admit: 2020-10-01 | Discharge: 2020-10-01 | Disposition: A | Discharge status: Home / Self Care | Payer: No Typology Code available for payment source | Attending: Internal Medicine | Admitting: Internal Medicine

## 2020-10-01 DIAGNOSIS — K2950 Unspecified chronic gastritis without bleeding: Secondary | ICD-10-CM | POA: Insufficient documentation

## 2020-10-01 DIAGNOSIS — K449 Diaphragmatic hernia without obstruction or gangrene: Secondary | ICD-10-CM | POA: Diagnosis not present

## 2020-10-01 DIAGNOSIS — Z7984 Long term (current) use of oral hypoglycemic drugs: Secondary | ICD-10-CM | POA: Insufficient documentation

## 2020-10-01 DIAGNOSIS — R1310 Dysphagia, unspecified: Secondary | ICD-10-CM | POA: Diagnosis present

## 2020-10-01 DIAGNOSIS — D122 Benign neoplasm of ascending colon: Secondary | ICD-10-CM | POA: Diagnosis not present

## 2020-10-01 DIAGNOSIS — E119 Type 2 diabetes mellitus without complications: Secondary | ICD-10-CM | POA: Insufficient documentation

## 2020-10-01 DIAGNOSIS — Z833 Family history of diabetes mellitus: Secondary | ICD-10-CM | POA: Insufficient documentation

## 2020-10-01 DIAGNOSIS — K2970 Gastritis, unspecified, without bleeding: Secondary | ICD-10-CM | POA: Diagnosis not present

## 2020-10-01 DIAGNOSIS — K31A15 Gastric intestinal metaplasia without dysplasia, involving multiple sites: Secondary | ICD-10-CM | POA: Insufficient documentation

## 2020-10-01 DIAGNOSIS — D123 Benign neoplasm of transverse colon: Secondary | ICD-10-CM | POA: Insufficient documentation

## 2020-10-01 DIAGNOSIS — Z803 Family history of malignant neoplasm of breast: Secondary | ICD-10-CM | POA: Diagnosis not present

## 2020-10-01 DIAGNOSIS — J449 Chronic obstructive pulmonary disease, unspecified: Secondary | ICD-10-CM | POA: Diagnosis not present

## 2020-10-01 DIAGNOSIS — I4891 Unspecified atrial fibrillation: Secondary | ICD-10-CM | POA: Insufficient documentation

## 2020-10-01 DIAGNOSIS — K219 Gastro-esophageal reflux disease without esophagitis: Secondary | ICD-10-CM | POA: Diagnosis not present

## 2020-10-01 DIAGNOSIS — K222 Esophageal obstruction: Secondary | ICD-10-CM | POA: Insufficient documentation

## 2020-10-01 DIAGNOSIS — D509 Iron deficiency anemia, unspecified: Secondary | ICD-10-CM | POA: Diagnosis not present

## 2020-10-01 DIAGNOSIS — Z7901 Long term (current) use of anticoagulants: Secondary | ICD-10-CM | POA: Insufficient documentation

## 2020-10-01 DIAGNOSIS — I350 Nonrheumatic aortic (valve) stenosis: Secondary | ICD-10-CM | POA: Insufficient documentation

## 2020-10-01 DIAGNOSIS — K5730 Diverticulosis of large intestine without perforation or abscess without bleeding: Secondary | ICD-10-CM | POA: Insufficient documentation

## 2020-10-01 DIAGNOSIS — K648 Other hemorrhoids: Secondary | ICD-10-CM | POA: Diagnosis not present

## 2020-10-01 HISTORY — PX: BALLOON DILATION: SHX5330

## 2020-10-01 HISTORY — PX: BIOPSY: SHX5522

## 2020-10-01 HISTORY — PX: POLYPECTOMY: SHX149

## 2020-10-01 HISTORY — PX: ESOPHAGOGASTRODUODENOSCOPY (EGD) WITH PROPOFOL: SHX5813

## 2020-10-01 HISTORY — PX: COLONOSCOPY WITH PROPOFOL: SHX5780

## 2020-10-01 LAB — GLUCOSE, CAPILLARY: Glucose-Capillary: 113 mg/dL — ABNORMAL HIGH (ref 70–99)

## 2020-10-01 SURGERY — COLONOSCOPY WITH PROPOFOL
Anesthesia: General

## 2020-10-01 MED ORDER — LACTATED RINGERS IV SOLN: Freq: Once | INTRAVENOUS | Status: AC

## 2020-10-01 MED ORDER — GLYCOPYRROLATE 0.2 MG/ML IJ SOLN
INTRAMUSCULAR | Status: AC
  Filled 2020-10-01: qty 1

## 2020-10-01 MED ORDER — PANTOPRAZOLE SODIUM 40 MG PO TBEC: 40.0000 mg | DELAYED_RELEASE_TABLET | Freq: Two times a day (BID) | ORAL | 5 refills | Status: DC

## 2020-10-01 MED ORDER — LACTATED RINGERS IV SOLN: INTRAVENOUS | Status: DC | PRN

## 2020-10-01 MED ORDER — LIDOCAINE VISCOUS HCL 2 % MT SOLN
15.0000 mL | Freq: Once | OROMUCOSAL | Status: AC
  Administered 2020-10-01: 15 mL via OROMUCOSAL

## 2020-10-01 MED ORDER — GLYCOPYRROLATE 0.2 MG/ML IJ SOLN
0.2000 mg | Freq: Once | INTRAMUSCULAR | Status: AC
  Administered 2020-10-01: 0.2 mg via INTRAVENOUS

## 2020-10-01 MED ORDER — PROPOFOL 10 MG/ML IV BOLUS
INTRAVENOUS | Status: DC | PRN
  Administered 2020-10-01: 30 mg via INTRAVENOUS
  Administered 2020-10-01 (×2): 20 mg via INTRAVENOUS
  Administered 2020-10-01: 10 mg via INTRAVENOUS
  Administered 2020-10-01: 20 mg via INTRAVENOUS
  Administered 2020-10-01: 50 mg via INTRAVENOUS
  Administered 2020-10-01: 20 mg via INTRAVENOUS

## 2020-10-01 MED ORDER — LIDOCAINE VISCOUS HCL 2 % MT SOLN
OROMUCOSAL | Status: AC
  Filled 2020-10-01: qty 15

## 2020-10-01 MED ORDER — PROPOFOL 500 MG/50ML IV EMUL
INTRAVENOUS | Status: DC | PRN
  Administered 2020-10-01: 150 ug/kg/min via INTRAVENOUS

## 2020-10-01 MED ORDER — GLYCOPYRROLATE 0.2 MG/ML IJ SOLN: 0.1000 mg | Freq: Once | INTRAMUSCULAR | Status: DC

## 2020-10-01 NOTE — Anesthesia Preprocedure Evaluation (Addendum)
Anesthesia Evaluation  Patient identified by MRN, date of birth, ID band Patient awake    Reviewed: Allergy & Precautions, NPO status , Patient's Chart, lab work & pertinent test results, reviewed documented beta blocker date and time   History of Anesthesia Complications Negative for: history of anesthetic complications  Airway Mallampati: II  TM Distance: >3 FB Neck ROM: Full    Dental  (+) Upper Dentures, Lower Dentures   Pulmonary COPD,  COPD inhaler, Current Smoker,    Pulmonary exam normal breath sounds clear to auscultation       Cardiovascular Exercise Tolerance: Good hypertension, Pt. on medications and Pt. on home beta blockers + Past MI  Normal cardiovascular exam+ dysrhythmias Atrial Fibrillation  Rhythm:Regular Rate:Normal     Neuro/Psych Anxiety negative neurological ROS     GI/Hepatic GERD  Medicated,Fatty liver    Endo/Other  diabetes, Well Controlled, Type 2  Renal/GU negative Renal ROS     Musculoskeletal  (+) Arthritis , Osteoarthritis,    Abdominal   Peds  Hematology  (+) anemia ,   Anesthesia Other Findings   Reproductive/Obstetrics negative OB ROS                           Anesthesia Physical Anesthesia Plan  ASA: III  Anesthesia Plan: General   Post-op Pain Management:    Induction: Intravenous  PONV Risk Score and Plan: TIVA  Airway Management Planned: Nasal Cannula and Natural Airway  Additional Equipment:   Intra-op Plan:   Post-operative Plan:   Informed Consent: I have reviewed the patients History and Physical, chart, labs and discussed the procedure including the risks, benefits and alternatives for the proposed anesthesia with the patient or authorized representative who has indicated his/her understanding and acceptance.       Plan Discussed with: CRNA and Surgeon  Anesthesia Plan Comments:        Anesthesia Quick Evaluation

## 2020-10-01 NOTE — Op Note (Signed)
[facility] Patient Name: Nicolas Berry Procedure Date: 10/01/2020 11:58 AM MRN: [id number] Date of Birth: December 07, 1945 Attending MD: Elon Alas. Edgar Frisk CSN: [id number] Age: 75 Admit Type: Outpatient Procedure:                Colonoscopy Indications:              Melena, Iron deficiency anemia Providers:                Elon Alas. Abbey Chatters, DO, Gwenlyn Fudge, RN, Dereck Leep, Technician, Nelma Rothman, Technician Referring MD:              Medicines:                See the Anesthesia note for documentation of the                            administered medications Complications:            No immediate complications. Estimated Blood Loss:     Estimated blood loss was minimal. Procedure:                Pre-Anesthesia Assessment:                           - The anesthesia plan was to use monitored                            anesthesia care (MAC).                           After obtaining informed consent, the colonoscope                            was passed under direct vision. Throughout the                            procedure, the patient's blood pressure, pulse, and                            oxygen saturations were monitored continuously. The                            [id number]-HQ190L ([id number]) scope was introduced through                            the anus and advanced to the the cecum, identified                            by appendiceal orifice and ileocecal valve. The                            colonoscopy was performed without difficulty. The                            patient tolerated  the procedure well. The quality                            of the bowel preparation was evaluated using the                            BBPS [facility] Bowel Preparation Scale) with scores                            of: Right Colon = 2 (minor amount of residual                            staining, small fragments of stool and/or opaque                             liquid, but mucosa seen well), Transverse Colon = 2                            (minor amount of residual staining, small fragments                            of stool and/or opaque liquid, but mucosa seen                            well) and Left Colon = 2 (minor amount of residual                            staining, small fragments of stool and/or opaque                            liquid, but mucosa seen well). The total BBPS score                            equals 6. The quality of the bowel preparation was                            fair. Scope In: 12:00:47 PM Scope Out: 12:23:00 PM Scope Withdrawal Time: 0 hours 17 minutes 7 seconds  Total Procedure Duration: 0 hours 22 minutes 13 seconds  Findings:      The perianal and digital rectal examinations were normal.      Non-bleeding internal hemorrhoids were found during endoscopy.      Many small and large-mouthed diverticula were found in the sigmoid colon       and descending colon.      A 5 mm polyp was found in the ascending colon. The polyp was sessile.       The polyp was removed with a cold snare. Resection was complete, but the       polyp tissue was not retrieved.      A 8 mm polyp was found in the transverse colon. The polyp was sessile.       The polyp was removed with a cold snare. Resection and retrieval were       complete.      A 5  mm polyp was found in the transverse colon. The polyp was sessile.       The polyp was removed with a cold snare. Resection and retrieval were       complete. Impression:               - Preparation of the colon was fair.                           - Non-bleeding internal hemorrhoids.                           - Diverticulosis in the sigmoid colon and in the                            descending colon.                           - One 5 mm polyp in the ascending colon, removed                            with a cold snare. Complete resection. Polyp tissue                            not  retrieved.                           - One 8 mm polyp in the transverse colon, removed                            with a cold snare. Resected and retrieved.                           - One 5 mm polyp in the transverse colon, removed                            with a cold snare. Resected and retrieved. Moderate Sedation:      Per Anesthesia Care Recommendation:           - Patient has a contact number available for                            emergencies. The signs and symptoms of potential                            delayed complications were discussed with the                            patient. Return to normal activities tomorrow.                            Written discharge instructions were provided to the                            patient.                           -  Resume previous diet.                           - Continue present medications.                           - Await pathology results.                           - Repeat colonoscopy in 3 years for surveillance                            and borderline prep.                           - Return to GI clinic in 3 months. Procedure Code(s):        --- Professional ---                           [phone], Colonoscopy, flexible; with removal of                            tumor(s), polyp(s), or other lesion(s) by snare                            technique Diagnosis Code(s):        --- Professional ---                           K64.8, Other hemorrhoids                           K63.5, Polyp of colon                           K92.1, Melena (includes Hematochezia)                           D50.9, Iron deficiency anemia, unspecified                           K57.30, Diverticulosis of large intestine without                            perforation or abscess without bleeding CPT copyright 2019 American Medical Association. All rights reserved. The codes documented in this report are preliminary and upon coder review may  be revised to  meet current compliance requirements. Elon Alas. Abbey Chatters, DO [place] Abbey Chatters, DO 10/01/2020 12:27:41 PM This report has been signed electronically. Number of Addenda: 0

## 2020-10-01 NOTE — Op Note (Signed)
[facility] Patient Name: Nicolas Berry Procedure Date: 10/01/2020 11:30 AM MRN: [id number] Date of Birth: 11/02/1945 Attending MD: Elon Alas. Abbey Chatters DO CSN: [id number] Age: 75 Admit Type: Outpatient Procedure:                Upper GI endoscopy Indications:              Dysphagia Providers:                Elon Alas. Abbey Chatters, DO, Gwenlyn Fudge, RN, Dereck Leep, Technician, Nelma Rothman, Technician Referring MD:              Medicines:                See the Anesthesia note for documentation of the                            administered medications Complications:            No immediate complications. Estimated Blood Loss:     Estimated blood loss was minimal. Procedure:                Pre-Anesthesia Assessment:                           - The anesthesia plan was to use monitored                            anesthesia care (MAC).                           After obtaining informed consent, the endoscope was                            passed under direct vision. Throughout the                            procedure, the patient's blood pressure, pulse, and                            oxygen saturations were monitored continuously. The                            GIF-H190 ([id number]) scope was introduced through the                            mouth, and advanced to the second part of duodenum.                            The upper GI endoscopy was accomplished without                            difficulty. The patient tolerated the procedure                            well. Scope  In: 11:50:12 AM Scope Out: 85:27:78 AM Total Procedure Duration: 0 hours 6 minutes 6 seconds  Findings:      A small hiatal hernia was present.      One benign-appearing, intrinsic mild stenosis was found in the lower       third of the esophagus. The stenosis was traversed. A TTS dilator was       passed through the scope. Dilation with an 18-19-20 mm balloon dilator        was performed to 20 mm. The dilation site was examined and showed       moderate improvement in luminal narrowing.      Localized moderate inflammation characterized by erosions and erythema       was found in the gastric antrum. Biopsies were taken with a cold forceps       for Helicobacter pylori testing.      The duodenal bulb, first portion of the duodenum and second portion of       the duodenum were normal. Impression:               - Small hiatal hernia.                           - Benign-appearing esophageal stenosis. Dilated.                           - Gastritis. Biopsied.                           - Normal duodenal bulb, first portion of the                            duodenum and second portion of the duodenum. Moderate Sedation:      Per Anesthesia Care Recommendation:           - Patient has a contact number available for                            emergencies. The signs and symptoms of potential                            delayed complications were discussed with the                            patient. Return to normal activities tomorrow.                            Written discharge instructions were provided to the                            patient.                           - Resume previous diet.                           - Continue present medications.                           -  Await pathology results.                           - Repeat upper endoscopy PRN for retreatment.                           - Use a proton pump inhibitor PO BID for 8 weeks. Procedure Code(s):        --- Professional ---                           [phone], Esophagogastroduodenoscopy, flexible,                            transoral; with transendoscopic balloon dilation of                            esophagus (less than 30 mm diameter)                           43239, 59, Esophagogastroduodenoscopy, flexible,                            transoral; with biopsy, single or multiple Diagnosis  Code(s):        --- Professional ---                           K44.9, Diaphragmatic hernia without obstruction or                            gangrene                           K22.2, Esophageal obstruction                           K29.70, Gastritis, unspecified, without bleeding                           R13.10, Dysphagia, unspecified CPT copyright 2019 [employer]. All rights reserved. The codes documented in this report are preliminary and upon coder review may  be revised to meet current compliance requirements. Elon Alas. Abbey Chatters, DO [place] Abbey Chatters, DO 10/01/2020 11:58:43 AM This report has been signed electronically. Number of Addenda: 0

## 2020-10-01 NOTE — Transfer of Care (Signed)
Immediate Anesthesia Transfer of Care Note  Patient: Nicolas Berry  Procedure(s) Performed: COLONOSCOPY WITH PROPOFOL (N/A ) ESOPHAGOGASTRODUODENOSCOPY (EGD) WITH PROPOFOL (N/A ) BALLOON DILATION (N/A ) BIOPSY POLYPECTOMY INTESTINAL  Patient Location: PACU  Anesthesia Type:General  Level of Consciousness: awake  Airway & Oxygen Therapy: Patient Spontanous Breathing  Post-op Assessment: Report given to RN  Post vital signs: Reviewed and stable  Last Vitals:  Vitals Value Taken Time  BP    Temp    Pulse    Resp    SpO2      Last Pain:  Vitals:   10/01/20 1146  TempSrc:   PainSc: 0-No pain         Complications: No complications documented.

## 2020-10-01 NOTE — Interval H&P Note (Signed)
History and Physical Interval Note:  10/01/2020 10:25 AM  Nicolas Berry  has presented today for surgery, with the diagnosis of iron deficiency anemia, GERD, dysphagia, melena.  The various methods of treatment have been discussed with the patient and family. After consideration of risks, benefits and other options for treatment, the patient has consented to  Procedure(s) with comments: COLONOSCOPY WITH PROPOFOL (N/A) - 2:15pm, pt know new time per office ESOPHAGOGASTRODUODENOSCOPY (EGD) WITH PROPOFOL (N/A) BALLOON DILATION (N/A) as a surgical intervention.  The patient's history has been reviewed, patient examined, no change in status, stable for surgery.  I have reviewed the patient's chart and labs.  Questions were answered to the patient's satisfaction.     Eloise Harman

## 2020-10-01 NOTE — Discharge Instructions (Signed)
EGD Discharge instructions Please read the instructions outlined below and refer to this sheet in the next few weeks. These discharge instructions provide you with general information on caring for yourself after you leave the hospital. Your doctor may also give you specific instructions. While your treatment has been planned according to the most current medical practices available, unavoidable complications occasionally occur. If you have any problems or questions after discharge, please call your doctor. ACTIVITY  You may resume your regular activity but move at a slower pace for the next 24 hours.   Take frequent rest periods for the next 24 hours.   Walking will help expel (get rid of) the air and reduce the bloated feeling in your abdomen.   No driving for 24 hours (because of the anesthesia (medicine) used during the test).   You may shower.   Do not sign any important legal documents or operate any machinery for 24 hours (because of the anesthesia used during the test).  NUTRITION  Drink plenty of fluids.   You may resume your normal diet.   Begin with a light meal and progress to your normal diet.   Avoid alcoholic beverages for 24 hours or as instructed by your caregiver.  MEDICATIONS  You may resume your normal medications unless your caregiver tells you otherwise.  WHAT YOU CAN EXPECT TODAY  You may experience abdominal discomfort such as a feeling of fullness or gas pains.  FOLLOW-UP  Your doctor will discuss the results of your test with you.  SEEK IMMEDIATE MEDICAL ATTENTION IF ANY OF THE FOLLOWING OCCUR:  Excessive nausea (feeling sick to your stomach) and/or vomiting.   Severe abdominal pain and distention (swelling).   Trouble swallowing.   Temperature over 101 F (37.8 C).   Rectal bleeding or vomiting of blood.     Colonoscopy Discharge Instructions  Read the instructions outlined below and refer to this sheet in the next few weeks. These  discharge instructions provide you with general information on caring for yourself after you leave the hospital. Your doctor may also give you specific instructions. While your treatment has been planned according to the most current medical practices available, unavoidable complications occasionally occur.   ACTIVITY  You may resume your regular activity, but move at a slower pace for the next 24 hours.   Take frequent rest periods for the next 24 hours.   Walking will help get rid of the air and reduce the bloated feeling in your belly (abdomen).   No driving for 24 hours (because of the medicine (anesthesia) used during the test).    Do not sign any important legal documents or operate any machinery for 24 hours (because of the anesthesia used during the test).  NUTRITION  Drink plenty of fluids.   You may resume your normal diet as instructed by your doctor.   Begin with a light meal and progress to your normal diet. Heavy or fried foods are harder to digest and may make you feel sick to your stomach (nauseated).   Avoid alcoholic beverages for 24 hours or as instructed.  MEDICATIONS  You may resume your normal medications unless your doctor tells you otherwise.  WHAT YOU CAN EXPECT TODAY  Some feelings of bloating in the abdomen.   Passage of more gas than usual.   Spotting of blood in your stool or on the toilet paper.  IF YOU HAD POLYPS REMOVED DURING THE COLONOSCOPY:  No aspirin products for 7 days or as instructed.  No alcohol for 7 days or as instructed.   Eat a soft diet for the next 24 hours.  FINDING OUT THE RESULTS OF YOUR TEST Not all test results are available during your visit. If your test results are not back during the visit, make an appointment with your caregiver to find out the results. Do not assume everything is normal if you have not heard from your caregiver or the medical facility. It is important for you to follow up on all of your test results.   SEEK IMMEDIATE MEDICAL ATTENTION IF:  You have more than a spotting of blood in your stool.   Your belly is swollen (abdominal distention).   You are nauseated or vomiting.   You have a temperature over 101.   You have abdominal pain or discomfort that is severe or gets worse throughout the day.   Your EGD revealed a moderate amount of inflammation in her stomach.  I biopsied this to rule out infection with a bacteria called H. pylori.  you also had a slight narrowing of your esophagus which I stressed with a balloon.  Hopefully this helps with your swallowing.  I want you to increase your pantoprazole to 40 mg twice daily for the next 8 weeks and decrease to once daily thereafter.  Avoid NSAIDs as best as you can.  Your colonoscopy was relatively unremarkable, I did remove 3 polyps.  The prep was borderline so would recommend that we repeat this in 3 years.  You also have diverticulosis and internal hemorrhoids. I would recommend increasing fiber in your diet or adding OTC Benefiber/Metamucil. Be sure to drink at least 4 to 6 glasses of water daily. .   Follow-up with GI in 3 months or sooner if needed.  I hope you have a great rest of your week!  Nicolas Berry. Nicolas Berry, D.O. Gastroenterology and Hepatology [facility]

## 2020-10-01 NOTE — Anesthesia Postprocedure Evaluation (Signed)
Anesthesia Post Note  Patient: [name]  Procedure(s) Performed: COLONOSCOPY WITH PROPOFOL (N/A ) ESOPHAGOGASTRODUODENOSCOPY (EGD) WITH PROPOFOL (N/A ) BALLOON DILATION (N/A ) BIOPSY POLYPECTOMY INTESTINAL  Patient location during evaluation: PACU Anesthesia Type: General Level of consciousness: awake and alert and oriented Pain management: pain level controlled Vital Signs Assessment: post-procedure vital signs reviewed and stable Respiratory status: spontaneous breathing Cardiovascular status: blood pressure returned to baseline and stable Postop Assessment: no apparent nausea or vomiting Anesthetic complications: no   No complications documented.   Last Vitals:  Vitals:   10/01/20 1038  BP: (!) 143/73  Pulse: 66  Resp: 13  Temp: 36.7 C  SpO2: 100%    Last Pain:  Vitals:   10/01/20 1146  TempSrc:   PainSc: 0-No pain                 Nicolas Berry

## 2020-10-02 ENCOUNTER — Other Ambulatory Visit: Payer: Self-pay

## 2020-10-03 ENCOUNTER — Encounter (HOSPITAL_COMMUNITY): Payer: Self-pay | Admitting: Internal Medicine

## 2020-10-03 LAB — SURGICAL PATHOLOGY

## 2020-10-07 ENCOUNTER — Other Ambulatory Visit: Payer: Self-pay

## 2020-10-07 ENCOUNTER — Encounter (HOSPITAL_BASED_OUTPATIENT_CLINIC_OR_DEPARTMENT_OTHER): Payer: Self-pay | Admitting: Ophthalmology

## 2020-10-09 ENCOUNTER — Other Ambulatory Visit (HOSPITAL_COMMUNITY)
Admission: RE | Admit: 2020-10-09 | Discharge: 2020-10-09 | Disposition: A | Discharge status: Home / Self Care | Payer: No Typology Code available for payment source | Source: Ambulatory Visit | Attending: Ophthalmology | Admitting: Ophthalmology

## 2020-10-09 DIAGNOSIS — Z20822 Contact with and (suspected) exposure to covid-19: Secondary | ICD-10-CM | POA: Diagnosis not present

## 2020-10-09 DIAGNOSIS — Z01812 Encounter for preprocedural laboratory examination: Secondary | ICD-10-CM | POA: Insufficient documentation

## 2020-10-09 LAB — SARS CORONAVIRUS 2 (TAT 6-24 HRS): SARS Coronavirus 2: NEGATIVE

## 2020-10-10 ENCOUNTER — Other Ambulatory Visit: Payer: Self-pay

## 2020-10-10 ENCOUNTER — Encounter (HOSPITAL_COMMUNITY): Payer: Self-pay | Admitting: Anesthesiology

## 2020-10-10 DIAGNOSIS — K2970 Gastritis, unspecified, without bleeding: Secondary | ICD-10-CM

## 2020-10-10 DIAGNOSIS — B9681 Helicobacter pylori [H. pylori] as the cause of diseases classified elsewhere: Secondary | ICD-10-CM

## 2020-10-10 MED ORDER — AMOXICILL-CLARITHRO-LANSOPRAZ PO MISC: Freq: Two times a day (BID) | ORAL | 0 refills | Status: DC

## 2020-10-10 NOTE — Anesthesia Preprocedure Evaluation (Deleted)
Anesthesia Evaluation    Reviewed: Allergy & Precautions, Patient's Chart, lab work & pertinent test results, reviewed documented beta blocker date and time   History of Anesthesia Complications Negative for: history of anesthetic complications  Airway        Dental   Pulmonary COPD,  COPD inhaler, Current Smoker,           Cardiovascular hypertension, Pt. on home beta blockers and Pt. on medications + dysrhythmias Atrial Fibrillation   TTE 07/2020: EF 60-65%, moderate LVH, mild LAE, chordal SAM without LVOT gradient, mild MR, mild AS     Neuro/Psych Anxiety negative neurological ROS     GI/Hepatic Neg liver ROS, GERD  Controlled and Medicated,  Endo/Other  diabetes, Type 2, Oral Hypoglycemic Agents  Renal/GU negative Renal ROS  negative genitourinary   Musculoskeletal  (+) Arthritis ,   Abdominal   Peds  Hematology  (+) anemia , Hgb 9.3, plt 149   Anesthesia Other Findings Day of surgery medications reviewed with patient.  Reproductive/Obstetrics negative OB ROS                             Anesthesia Physical Anesthesia Plan  ASA: II  Anesthesia Plan: General   Post-op Pain Management:    Induction: Intravenous  PONV Risk Score and Plan: 1 and Treatment may vary due to age or medical condition, Dexamethasone and Ondansetron  Airway Management Planned: LMA  Additional Equipment: None  Intra-op Plan:   Post-operative Plan: Extubation in OR  Informed Consent:   Plan Discussed with:   Anesthesia Plan Comments:         Anesthesia Quick Evaluation

## 2020-10-11 ENCOUNTER — Ambulatory Visit (HOSPITAL_BASED_OUTPATIENT_CLINIC_OR_DEPARTMENT_OTHER)
Admission: RE | Admit: 2020-10-11 | Payer: No Typology Code available for payment source | Source: Home / Self Care | Admitting: Ophthalmology

## 2020-10-11 SURGERY — REPAIR STRABISMUS
Anesthesia: General | Laterality: Left

## 2020-10-14 ENCOUNTER — Ambulatory Visit: Payer: Self-pay | Admitting: Ophthalmology

## 2020-10-21 NOTE — Progress Notes (Unsigned)
Cardiology Office Note  Date: 10/22/2020   ID: Gurfateh Mcclain, DOB Feb 01, 1946, MRN [id number]  PCP:  [facility]  Cardiologist:  Carlyle Dolly, MD Electrophysiologist:  None   Chief Complaint: Atrial fibrillation, anemia, melena, dyspnea on exertion  History of Present Illness: Andersson Larrabee is a 75 y.o. male with a history of atrial fibrillation, COPD, HLD, HTN, DM 2, BPH, arthritis, fatty liver disease, CAD, dizziness, leg pain.  At last visit we stopped his Eliquis due to complaints of dark tarry stools.  His recent hemoglobin and hematocrit as of 09/17/2020 was 8.7 and 29.8.  He had a pending EGD and colonoscopy scheduled 10/01/2020 with Dr. Abbey Chatters.  Stated he felt significantly weak but since stopping his Eliquis his black tarry stools have cleared up and he has normal-appearing stools.  He had upcoming surgery in late January .  Stated his legs felt weak and he had not been sleeping well. Blood pressure was low normal.  Recently had EGD and colonoscopy secondary to GI bleed/anemia.  Impression of EGD was small hiatal hernia, benign-appearing esophageal stenosis which was dilated.  Gastritis which was biopsied.  Normal duodenal bulb. Colonoscopy found nonbleeding internal hemorrhoids, many small and large mouth diverticula in sigmoid and descending colon.  5 mm polyp ascending colon polyp removed with cold snare resection was complete but polyp not received.  8 mm polyp in transverse colon removed.  He is here for follow-up today.  States his bleeding has completely resolved.  He denies any new issues other than the fact that he has scheduled surgery on his left to repair strabismus by Dr. Annamaria Boots.  Otherwise denies any anginal or exertional symptoms, palpitations or arrhythmias, orthostatic symptoms, PND, orthopnea.  Denies any claudication-like symptoms but does have sciatica in his right leg.  Denies any DVT or PE-like symptoms, or lower extremity edema.   Past  Medical History:  Diagnosis Date  . Anxiety    OA multiple joints  . Arthritis   . Atrial fibrillation ([place])   . BPH (benign prostatic hyperplasia)   . COPD (chronic obstructive pulmonary disease) (HCC)    uses inhaler  . DDD (degenerative disc disease), lumbar   . Diabetes mellitus without complication ([place])   . Fatty liver   . GERD (gastroesophageal reflux disease)   . Hyperlipidemia   . Hypertension     Past Surgical History:  Procedure Laterality Date  . BACK SURGERY     C6-C7  . BALLOON DILATION N/A 10/01/2020   Procedure: BALLOON DILATION;  Surgeon: Eloise Harman, DO;  [facility];  Service: Endoscopy;  Laterality: N/A;  . BIOPSY  10/01/2020   Procedure: BIOPSY;  Surgeon: Eloise Harman, DO;  [facility];  Service: Endoscopy;;  gastric  . COLONOSCOPY WITH PROPOFOL N/A 10/01/2020   Procedure: COLONOSCOPY WITH PROPOFOL;  Surgeon: Eloise Harman, DO;  [facility];  Service: Endoscopy;  Laterality: N/A;  2:15pm, pt know new time per office  . ESOPHAGOGASTRODUODENOSCOPY (EGD) WITH PROPOFOL N/A 10/01/2020   Procedure: ESOPHAGOGASTRODUODENOSCOPY (EGD) WITH PROPOFOL;  Surgeon: Eloise Harman, DO;  [facility];  Service: Endoscopy;  Laterality: N/A;  . HERNIA REPAIR     left inguinal  . POLYPECTOMY  10/01/2020   Procedure: POLYPECTOMY INTESTINAL;  Surgeon: Eloise Harman, DO;  [facility];  Service: Endoscopy;;  . TONSILLECTOMY      Current Outpatient Medications  Medication Sig Dispense Refill  . acarbose (PRECOSE) 100  MG tablet Take 100 mg by mouth daily.     Marland Kitchen. amoxicillin-clarithromycin-lansoprazole (PREVPAC) combo pack Take by mouth 2 (two) times daily for 14 days. Follow package directions. 28 each 0  . carvedilol (COREG) 3.125 MG tablet Take 1 tablet (3.125 mg total) by mouth 2 (two) times daily. 60 tablet 6  . Cholecalciferol 25 MCG (1000 UT) tablet Take 1,000 Units by mouth daily.    . DULoxetine  (CYMBALTA) 60 MG capsule Take 60 mg by mouth daily.    . ferrous sulfate 325 (65 FE) MG tablet Take 325 mg by mouth 2 (two) times daily with a meal.    . finasteride (PROSCAR) 5 MG tablet Take 5 mg by mouth daily.    . Fluticasone-Salmeterol (ADVAIR) 250-50 MCG/DOSE AEPB Inhale 1 puff into the lungs 2 (two) times daily.    Marland Kitchen. gabapentin (NEURONTIN) 300 MG capsule Take 300 mg by mouth 2 (two) times daily.   1  . HYDROcodone-acetaminophen (NORCO) 7.5-325 MG tablet Take 1 tablet by mouth 4 (four) times daily.    Marland Kitchen. losartan (COZAAR) 25 MG tablet Take 1 tablet (25 mg total) by mouth daily. 90 tablet 3  . metFORMIN (GLUCOPHAGE) 1000 MG tablet Take 500 mg by mouth 2 (two) times daily with a meal.    . pantoprazole (PROTONIX) 40 MG tablet Take 1 tablet (40 mg total) by mouth 2 (two) times daily before a meal. 60 tablet 5  . rosuvastatin (CRESTOR) 40 MG tablet Take 40 mg by mouth daily.    . traZODone (DESYREL) 100 MG tablet Take 50 mg by mouth at bedtime.     No current facility-administered medications for this visit.   Allergies:  Patient has no known allergies.   Social History: The patient  reports that he has been smoking cigarettes. He started smoking about 60 years ago. He has been smoking about 1.50 packs per day. He has never used smokeless tobacco. He reports current alcohol use. He reports previous drug use. Drug: Marijuana.   Family History: The patient's family history includes Cancer in his mother and sister; Diabetes in his father.   ROS:  Please see the history of present illness. Otherwise, complete review of systems is positive for none.  All other systems are reviewed and negative.   Physical Exam: VS:  BP 132/72   Pulse 64   Ht 5\' 10"  (1.778 m)   Wt 158 lb 3.2 oz (71.8 kg)   SpO2 97%   BMI 22.70 kg/m , BMI Body mass index is 22.7 kg/m.  Wt Readings from Last 3 Encounters:  10/22/20 158 lb 3.2 oz (71.8 kg)  09/27/20 159 lb 13.3 oz (72.5 kg)  09/19/20 159 lb 12.8 oz (72.5  kg)    General: Patient appears comfortable at rest. Neck: Supple, no elevated JVP or carotid bruits, no thyromegaly. Lungs: Clear to auscultation, nonlabored breathing at rest. Cardiac: Regular rate and rhythm, no S3 or significant systolic murmur, no pericardial rub. Extremities: No pitting edema, distal pulses 2+. Skin: Warm and dry. Musculoskeletal: No kyphosis. Neuropsychiatric: Alert and oriented x3, affect grossly appropriate.  ECG: 08/08/2020 EKG demonstrates normal sinus rhythm with a rate of 75.  Recent Labwork: 09/27/2020: BUN 19; Creatinine, Ser 1.18; Hemoglobin 9.3; Platelets 149; Potassium 3.9; Sodium 137     Component Value Date/Time   CHOL 118 05/18/2016 0442   TRIG 143 05/18/2016 0442   HDL 31 (L) 05/18/2016 0442   CHOLHDL 3.8 05/18/2016 0442   VLDL 29 05/18/2016 0442  [place] 58 05/18/2016 0442    Other Studies Reviewed Today:  Echocardiogram 08/13/2020 1. Left ventricular ejection fraction, by estimation, is 60 to 65%. The left ventricle has normal function. The left ventricle has no regional wall motion abnormalities. There is moderate left ventricular hypertrophy. Left ventricular diastolic parameters were normal. 2. Right ventricular systolic function is normal. The right ventricular size is normal. There is normal pulmonary artery systolic pressure. 3. Left atrial size was mildly dilated. 4. Chordal SAM without LVOT gradient. The mitral valve is degenerative. Mild mitral valve regurgitation. No evidence of mitral stenosis. Severe mitral annular calcification. 5. The aortic valve is tricuspid. There is moderate calcification of the aortic valve. There is moderate thickening of the aortic valve. Aortic valve regurgitation is trivial. Mild aortic valve stenosis. 6. The inferior vena cava is dilated in size with >50% respiratory variability, suggesting right atrial pressure of 8 mmHg.   Diagnostic Studies 04/2016 echo Study Conclusions  - Left  ventricle: Turbulent flow through LVOT. Systolic anterior motio of MV leaflets Peak gradient through LV/LVOT /AV is 15 mm Hg at rest No signifiant obstruction at rest. The cavity size was normal. Wall thickness was increased in a pattern of mild LVH. Systolic function was vigorous. The estimated ejection fraction was in the range of 65% to 70%. Doppler parameters are consistent with abnormal left ventricular relaxation (grade 1 diastolic dysfunction). - Aortic valve: AV is thickened, calcified with minimally restricted motion. - Mitral valve: SAM of mitral valve leaflets. Calcified annulus. Mildly thickened leaflets . - Left atrium: The atrium was mildly dilated. - Pulmonary arteries: PA peak pressure: 32 mm Hg (S).  04/2016 CT PE IMPRESSION: 1. No evidence of acute pulmonary embolism. 2. Mild cardiomegaly. Left ventricular hypertrophy. Mild aortic valvular calcification. 3. Moderate three-vessel coronary atherosclerosis and mild aortic atherosclerosis. 4. Mild circumferential wall thickening involving the upper thoracic esophagus and of the distal esophagus. Query esophagitis as no distinct mass is identified. Upper endoscopy may be confirmatory. 5. COPD/emphysema. No acute cardiopulmonary disease.    04/2016 Nuclear stress  There was no ST segment deviation noted during stress.  No T wave inversion was noted during stress.  The study is normal.  This is a low risk study.  The left ventricular ejection fraction is hyperdynamic (>65%).   01/2018 ABIs FINDINGS: Right ABI: 0.98  Left ABI: 0.92  Right Lower Extremity: Normal arterial waveforms at the ankle.  Left Lower Extremity: Normal arterial waveforms at the ankle.  IMPRESSION: 1. Borderline normal bilateral ABIs. Consider postexercise testing to exclude occult arterial occlusive disease.    Assessment and Plan:   1. Paroxysmal atrial fibrillation (HCC) No complaints of  palpitations or arrhythmias.  Pulse regular and 64. He stated since stopping the Eliquis his black tarry stools have cleared up.  Had recent EGD and colonoscopy which revealed no source of bleeding.  Will reach out to GI to receive guidance on restarting anticoagulation.  This was recommended by Dr. Harl Bowie after consulting with him.  Continue carvedilol 3.125 mg p.o. twice daily.  Depending on response of GI may restart Eliquis or another agent.  2. CAD in native artery Denies any anginal or exertional symptoms.  CT scan on 05/21/2016 showed moderate three-vessel coronary artery atherosclerosis and mild aortic atherosclerosis.  Continue Crestor 40 mg daily.  3. Essential hypertension, benign Blood pressure is well controlled.  BP 132/72.  Continue losartan to 25 mg daily  4.  Smoker History of long-term smoking.  Reinforced need to stop smoking today.  Patient is aware of the implications of continuing to smoke.  5.  Anemia Had recent EGD and colonoscopy secondary to anemia EGD was small hiatal hernia, benign-appearing esophageal stenosis which was dilated.  Gastritis which was biopsied.  Normal duodenal bulb. Colonoscopy found nonbleeding internal hemorrhoids, many small and large mouth diverticula in sigmoid and descending colon.  5 mm polyp ascending colon polyp removed with cold snare resection was complete but polyp not received.  8 mm polyp in transverse colon removed.  Recent hemoglobin and hematocrit 9.3 and 32.7.  No further GI bleeding.  6.  DOE At last visit he complained of progressively worsening dyspnea on exertion over the last few months. Last echocardiogram August 2017 demonstrated turbulent flow through the LVOT with  systolic anterior motion mitral valve leaflets.  Repeat echocardiogram 08/13/2020 demonstrated EF of 60 to 65%.  No WMA's.  Moderate LVH normal diastolic parameters.  LA was mildly dilated.:  S.A.M. without LVOT gradient.  Severe mitral annular calcification.  Moderate  calcification of aortic valve.  Trivial AR mild AS.    Medication Adjustments/Labs and Tests Ordered: Current medicines are reviewed at length with the patient today.  Concerns regarding medicines are outlined above.   Disposition: Follow-up with Dr. Harl Bowie or APP 3 months signed, Levell July, NP 10/22/2020 12:05 PM    [facility] at [place], [place], [place] [address] Phone: [phone]; Fax: [phone]

## 2020-10-22 ENCOUNTER — Encounter (HOSPITAL_BASED_OUTPATIENT_CLINIC_OR_DEPARTMENT_OTHER): Payer: Self-pay | Admitting: Ophthalmology

## 2020-10-22 ENCOUNTER — Other Ambulatory Visit (HOSPITAL_COMMUNITY): Payer: No Typology Code available for payment source

## 2020-10-22 ENCOUNTER — Other Ambulatory Visit: Payer: Self-pay

## 2020-10-22 ENCOUNTER — Ambulatory Visit (INDEPENDENT_AMBULATORY_CARE_PROVIDER_SITE_OTHER): Payer: No Typology Code available for payment source | Admitting: Family Medicine

## 2020-10-22 ENCOUNTER — Encounter: Payer: Self-pay | Admitting: Family Medicine

## 2020-10-22 VITALS — BP 132/72 | HR 64 | Ht 70.0 in | Wt 158.2 lb

## 2020-10-22 DIAGNOSIS — I10 Essential (primary) hypertension: Secondary | ICD-10-CM | POA: Diagnosis not present

## 2020-10-22 DIAGNOSIS — I2510 Atherosclerotic heart disease of native coronary artery without angina pectoris: Secondary | ICD-10-CM

## 2020-10-22 DIAGNOSIS — I480 Paroxysmal atrial fibrillation: Secondary | ICD-10-CM | POA: Diagnosis not present

## 2020-10-22 DIAGNOSIS — D649 Anemia, unspecified: Secondary | ICD-10-CM | POA: Diagnosis not present

## 2020-10-22 DIAGNOSIS — R0609 Other forms of dyspnea: Secondary | ICD-10-CM

## 2020-10-22 DIAGNOSIS — R0600 Dyspnea, unspecified: Secondary | ICD-10-CM

## 2020-10-22 NOTE — Progress Notes (Signed)
Chart reviewed with Dr Oddono, OK for DSC. 

## 2020-10-22 NOTE — Progress Notes (Signed)
Okay thanks Dr. Abbey Chatters

## 2020-10-22 NOTE — Patient Instructions (Addendum)
Medication Instructions:  Continue all current medications.  Labwork: none  Testing/Procedures: none  Follow-Up: 3 months   Any Other Special Instructions Will Be Listed Below (If Applicable).  If you need a refill on your cardiac medications before your next appointment, please call your pharmacy.  

## 2020-10-23 ENCOUNTER — Other Ambulatory Visit (HOSPITAL_COMMUNITY)
Admission: RE | Admit: 2020-10-23 | Discharge: 2020-10-23 | Disposition: A | Discharge status: Home / Self Care | Payer: No Typology Code available for payment source | Source: Ambulatory Visit | Attending: Ophthalmology | Admitting: Ophthalmology

## 2020-10-23 DIAGNOSIS — Z20822 Contact with and (suspected) exposure to covid-19: Secondary | ICD-10-CM | POA: Diagnosis not present

## 2020-10-23 DIAGNOSIS — Z01812 Encounter for preprocedural laboratory examination: Secondary | ICD-10-CM | POA: Diagnosis present

## 2020-10-23 LAB — SARS CORONAVIRUS 2 (TAT 6-24 HRS): SARS Coronavirus 2: NEGATIVE

## 2020-10-25 ENCOUNTER — Encounter (HOSPITAL_BASED_OUTPATIENT_CLINIC_OR_DEPARTMENT_OTHER): Payer: Self-pay | Admitting: Ophthalmology

## 2020-10-25 ENCOUNTER — Ambulatory Visit (HOSPITAL_BASED_OUTPATIENT_CLINIC_OR_DEPARTMENT_OTHER)
Admission: RE | Admit: 2020-10-25 | Discharge: 2020-10-25 | Disposition: A | Discharge status: Home / Self Care | Payer: No Typology Code available for payment source | Attending: Ophthalmology | Admitting: Ophthalmology

## 2020-10-25 ENCOUNTER — Ambulatory Visit (HOSPITAL_BASED_OUTPATIENT_CLINIC_OR_DEPARTMENT_OTHER): Payer: No Typology Code available for payment source | Admitting: Anesthesiology

## 2020-10-25 ENCOUNTER — Ambulatory Visit: Payer: Self-pay | Admitting: Ophthalmology

## 2020-10-25 ENCOUNTER — Encounter (HOSPITAL_BASED_OUTPATIENT_CLINIC_OR_DEPARTMENT_OTHER)
Admission: RE | Disposition: A | Discharge status: Home / Self Care | Payer: Self-pay | Source: Home / Self Care | Attending: Ophthalmology

## 2020-10-25 ENCOUNTER — Other Ambulatory Visit: Payer: Self-pay

## 2020-10-25 DIAGNOSIS — H5111 Convergence insufficiency: Secondary | ICD-10-CM | POA: Diagnosis not present

## 2020-10-25 DIAGNOSIS — H5010 Unspecified exotropia: Secondary | ICD-10-CM | POA: Diagnosis present

## 2020-10-25 DIAGNOSIS — H532 Diplopia: Secondary | ICD-10-CM | POA: Diagnosis not present

## 2020-10-25 DIAGNOSIS — I4891 Unspecified atrial fibrillation: Secondary | ICD-10-CM | POA: Diagnosis not present

## 2020-10-25 DIAGNOSIS — E119 Type 2 diabetes mellitus without complications: Secondary | ICD-10-CM | POA: Insufficient documentation

## 2020-10-25 DIAGNOSIS — I10 Essential (primary) hypertension: Secondary | ICD-10-CM | POA: Diagnosis not present

## 2020-10-25 DIAGNOSIS — H5015 Alternating exotropia: Secondary | ICD-10-CM | POA: Diagnosis not present

## 2020-10-25 DIAGNOSIS — E785 Hyperlipidemia, unspecified: Secondary | ICD-10-CM | POA: Diagnosis not present

## 2020-10-25 HISTORY — DX: Anemia, unspecified: D64.9

## 2020-10-25 HISTORY — PX: STRABISMUS SURGERY: SHX218

## 2020-10-25 LAB — BASIC METABOLIC PANEL
Anion gap: 10 (ref 5–15)
BUN: 14 mg/dL (ref 8–23)
CO2: 26 mmol/L (ref 22–32)
Calcium: 9.5 mg/dL (ref 8.9–10.3)
Chloride: 104 mmol/L (ref 98–111)
Creatinine, Ser: 1.17 mg/dL (ref 0.61–1.24)
GFR, Estimated: >60 mL/min (ref >60)
Glucose, Bld: 124 mg/dL — ABNORMAL HIGH (ref 70–99)
Potassium: 3.6 mmol/L (ref 3.5–5.1)
Sodium: 140 mmol/L (ref 135–145)

## 2020-10-25 LAB — GLUCOSE, CAPILLARY
Glucose-Capillary: 120 mg/dL — ABNORMAL HIGH (ref 70–99)
Glucose-Capillary: 164 mg/dL — ABNORMAL HIGH (ref 70–99)

## 2020-10-25 SURGERY — REPAIR STRABISMUS
Anesthesia: General | Site: Eye | Laterality: Left

## 2020-10-25 MED ORDER — TOBRAMYCIN-DEXAMETHASONE 0.3-0.1 % OP OINT
TOPICAL_OINTMENT | OPHTHALMIC | Status: DC | PRN
  Administered 2020-10-25: 1 via OPHTHALMIC

## 2020-10-25 MED ORDER — LIDOCAINE 2% (20 MG/ML) 5 ML SYRINGE
INTRAMUSCULAR | Status: AC
  Filled 2020-10-25: qty 5

## 2020-10-25 MED ORDER — DEXAMETHASONE SODIUM PHOSPHATE 4 MG/ML IJ SOLN
INTRAMUSCULAR | Status: DC | PRN
  Administered 2020-10-25: 5 mg via INTRAVENOUS

## 2020-10-25 MED ORDER — FENTANYL CITRATE (PF) 100 MCG/2ML IJ SOLN
INTRAMUSCULAR | Status: AC
  Filled 2020-10-25: qty 2

## 2020-10-25 MED ORDER — AMISULPRIDE (ANTIEMETIC) 5 MG/2ML IV SOLN: 10.0000 mg | Freq: Once | INTRAVENOUS | Status: DC | PRN

## 2020-10-25 MED ORDER — ONDANSETRON HCL 4 MG/2ML IJ SOLN
INTRAMUSCULAR | Status: AC
  Filled 2020-10-25: qty 2

## 2020-10-25 MED ORDER — FENTANYL CITRATE (PF) 100 MCG/2ML IJ SOLN
INTRAMUSCULAR | Status: DC | PRN
  Administered 2020-10-25: 50 ug via INTRAVENOUS
  Administered 2020-10-25 (×2): 25 ug via INTRAVENOUS

## 2020-10-25 MED ORDER — ACETAMINOPHEN 500 MG PO TABS
1000.0000 mg | ORAL_TABLET | Freq: Once | ORAL | Status: AC
  Administered 2020-10-25: 1000 mg via ORAL

## 2020-10-25 MED ORDER — PROPOFOL 10 MG/ML IV BOLUS
INTRAVENOUS | Status: DC | PRN
  Administered 2020-10-25: 120 mg via INTRAVENOUS

## 2020-10-25 MED ORDER — ONDANSETRON HCL 4 MG/2ML IJ SOLN
INTRAMUSCULAR | Status: DC | PRN
  Administered 2020-10-25: 4 mg via INTRAVENOUS

## 2020-10-25 MED ORDER — ARTIFICIAL TEARS OPHTHALMIC OINT
TOPICAL_OINTMENT | OPHTHALMIC | Status: AC
  Filled 2020-10-25: qty 3.5

## 2020-10-25 MED ORDER — SUCCINYLCHOLINE CHLORIDE 200 MG/10ML IV SOSY
PREFILLED_SYRINGE | INTRAVENOUS | Status: AC
  Filled 2020-10-25: qty 10

## 2020-10-25 MED ORDER — GLYCOPYRROLATE 0.2 MG/ML IJ SOLN
INTRAMUSCULAR | Status: DC | PRN
  Administered 2020-10-25: .1 mg via INTRAVENOUS

## 2020-10-25 MED ORDER — LACTATED RINGERS IV SOLN: INTRAVENOUS | Status: DC

## 2020-10-25 MED ORDER — DEXAMETHASONE SODIUM PHOSPHATE 10 MG/ML IJ SOLN
INTRAMUSCULAR | Status: AC
  Filled 2020-10-25: qty 1

## 2020-10-25 MED ORDER — FENTANYL CITRATE (PF) 100 MCG/2ML IJ SOLN: 25.0000 ug | INTRAMUSCULAR | Status: DC | PRN

## 2020-10-25 MED ORDER — KETOROLAC TROMETHAMINE 30 MG/ML IJ SOLN
INTRAMUSCULAR | Status: AC
  Filled 2020-10-25: qty 1

## 2020-10-25 MED ORDER — KETOROLAC TROMETHAMINE 15 MG/ML IJ SOLN
INTRAMUSCULAR | Status: DC | PRN
  Administered 2020-10-25: 15 mg via INTRAVENOUS

## 2020-10-25 MED ORDER — LIDOCAINE HCL (CARDIAC) PF 100 MG/5ML IV SOSY
PREFILLED_SYRINGE | INTRAVENOUS | Status: DC | PRN
  Administered 2020-10-25: 80 mg via INTRAVENOUS

## 2020-10-25 MED ORDER — GLYCOPYRROLATE PF 0.2 MG/ML IJ SOSY
PREFILLED_SYRINGE | INTRAMUSCULAR | Status: AC
  Filled 2020-10-25: qty 1

## 2020-10-25 MED ORDER — DIPHENHYDRAMINE HCL 50 MG/ML IJ SOLN
INTRAMUSCULAR | Status: DC | PRN
  Administered 2020-10-25: 6.25 mg via INTRAVENOUS

## 2020-10-25 MED ORDER — ACETAMINOPHEN 500 MG PO TABS
ORAL_TABLET | ORAL | Status: AC
  Filled 2020-10-25: qty 2

## 2020-10-25 SURGICAL SUPPLY — 33 items
APPLICATOR COTTON TIP 6 STRL (MISCELLANEOUS) ×4 IMPLANT
APPLICATOR COTTON TIP 6IN STRL (MISCELLANEOUS) ×8 IMPLANT
APPLICATOR DR MATTHEWS STRL (MISCELLANEOUS) ×2 IMPLANT
BNDG EYE OVAL (GAUZE/BANDAGES/DRESSINGS) IMPLANT
CAUTERY EYE LOW TEMP 1300F FIN (OPHTHALMIC RELATED) IMPLANT
COVER BACK TABLE 60X90IN (DRAPES) ×2 IMPLANT
COVER MAYO STAND STRL (DRAPES) ×2 IMPLANT
COVER WAND RF STERILE (DRAPES) IMPLANT
DRAPE SURG 17X23 STRL (DRAPES) ×4 IMPLANT
DRAPE U-SHAPE 76X120 STRL (DRAPES) ×1 IMPLANT
GLOVE SURG LTX SZ6.5 (GLOVE) ×2 IMPLANT
GLOVE SURG PR MICRO ENCORE 7.5 (GLOVE) ×2 IMPLANT
GLOVE SURG UNDER POLY LF SZ7 (GLOVE) ×4 IMPLANT
GOWN STRL REUS W/ TWL LRG LVL3 (GOWN DISPOSABLE) ×1 IMPLANT
GOWN STRL REUS W/TWL LRG LVL3 (GOWN DISPOSABLE) ×1
GOWN STRL REUS W/TWL XL LVL3 (GOWN DISPOSABLE) ×2 IMPLANT
NS IRRIG 1000ML POUR BTL (IV SOLUTION) ×2 IMPLANT
PACK BASIN DAY SURGERY FS (CUSTOM PROCEDURE TRAY) ×2 IMPLANT
PATTIES SURGICAL .25X.25 (GAUZE/BANDAGES/DRESSINGS) IMPLANT
SHEET MEDIUM DRAPE 40X70 STRL (DRAPES) IMPLANT
SPEAR EYE SURG WECK-CEL (MISCELLANEOUS) ×4 IMPLANT
STRIP CLOSURE SKIN 1/4X4 (GAUZE/BANDAGES/DRESSINGS) IMPLANT
SUT 6 0 SILK T G140 8DA (SUTURE) ×1 IMPLANT
SUT MERSILENE 6-0 18IN S14 8MM (SUTURE)
SUT PLAIN 6 0 TG1408 (SUTURE) ×1 IMPLANT
SUT SILK 4 0 C 3 735G (SUTURE) IMPLANT
SUT VICRYL 6 0 S 28 (SUTURE) IMPLANT
SUT VICRYL ABS 6-0 S29 18IN (SUTURE) ×2 IMPLANT
SUTURE MERSLN 6-0 18IN S14 8MM (SUTURE) IMPLANT
SYR 10ML LL (SYRINGE) ×2 IMPLANT
SYR TB 1ML LL NO SAFETY (SYRINGE) ×2 IMPLANT
TOWEL GREEN STERILE FF (TOWEL DISPOSABLE) ×2 IMPLANT
TRAY DSU PREP LF (CUSTOM PROCEDURE TRAY) ×2 IMPLANT

## 2020-10-25 NOTE — Anesthesia Preprocedure Evaluation (Addendum)
Anesthesia Evaluation  Patient identified by MRN, date of birth, ID band Patient awake    Reviewed: Allergy & Precautions, NPO status , Patient's Chart, lab work & pertinent test results, reviewed documented beta blocker date and time   Airway Mallampati: II  TM Distance: >3 FB Neck ROM: Full    Dental  (+) Dental Advisory Given   Pulmonary COPD, Current Smoker and Patient abstained from smoking.,    Pulmonary exam normal breath sounds clear to auscultation       Cardiovascular hypertension, Pt. on home beta blockers and Pt. on medications + CAD  Normal cardiovascular exam+ dysrhythmias Atrial Fibrillation  Rhythm:Regular Rate:Normal  Echo 07/2020 1. Left ventricular ejection fraction, by estimation, is 60 to 65%. The left ventricle has normal function. The left ventricle has no regional wall motion abnormalities. There is moderate left ventricular hypertrophy. Left ventricular diastolic parameters were normal.  2. Right ventricular systolic function is normal. The right ventricular size is normal. There is normal pulmonary artery systolic pressure.  3. Left atrial size was mildly dilated.  4. Chordal SAM without LVOT gradient. The mitral valve is degenerative. Mild mitral valve regurgitation. No evidence of mitral stenosis. Severe mitral annular calcification.  5. The aortic valve is tricuspid. There is moderate calcification of the aortic valve. There is moderate thickening of the aortic valve. Aortic valve regurgitation is trivial. Mild aortic valve stenosis.  6. The inferior vena cava is dilated in size with >50% respiratory variability, suggesting right atrial pressure of 8 mmHg.    Neuro/Psych PSYCHIATRIC DISORDERS Anxiety negative neurological ROS     GI/Hepatic Neg liver ROS, GERD  ,  Endo/Other  diabetes, Type 2  Renal/GU negative Renal ROS     Musculoskeletal  (+) Arthritis ,   Abdominal   Peds   Hematology  (+) Blood dyscrasia, anemia ,   Anesthesia Other Findings   Reproductive/Obstetrics                           Anesthesia Physical Anesthesia Plan  ASA: III  Anesthesia Plan: General   Post-op Pain Management:    Induction: Intravenous  PONV Risk Score and Plan: 3 and Ondansetron, Dexamethasone and Treatment may vary due to age or medical condition  Airway Management Planned: LMA and Oral ETT  Additional Equipment: None  Intra-op Plan:   Post-operative Plan: Extubation in OR  Informed Consent: I have reviewed the patients History and Physical, chart, labs and discussed the procedure including the risks, benefits and alternatives for the proposed anesthesia with the patient or authorized representative who has indicated his/her understanding and acceptance.     Dental advisory given  Plan Discussed with: CRNA  Anesthesia Plan Comments:        Anesthesia Quick Evaluation

## 2020-10-25 NOTE — Op Note (Signed)
10/25/2020  10:21 AM  PATIENT:  Nicolas Berry    PRE-OPERATIVE DIAGNOSIS:  Exotropia, convergence insufficiency type  POST-OPERATIVE DIAGNOSIS:  Exotropia, convergence insufficiency type  PROCEDURE:  1. Lateral rectus muscle recession 8.0 mm left eye   2.  Medial rectus muscle resection 7.0 mm left eye    SURGEON:  Derry Skill, MD  ANESTHESIA:   General  COMPLICATIONS: none  OPERATIVE PROCEDURE: After routine preoperative evaluation including informed consent, the patient was taken to the operating room where He was identified by me. General anesthesia was induced without difficulty after placement of appropriate monitors. The patient was prepped and draped in standard sterile fashion. A lid speculum was placed in the right eye.   Through an inferotemporal fornix incision, the right lateral rectus muscle was engaged on a series of muscle hooks and cleared of its fascial attachments. The tendon was secured with a double-armed 6-0 Vicryl suture, with a locking bite at each border of the muscle, 1 mm from the insertion. The muscle was disinserted.  It was reattached to sclera at a measured distance of 8.0 mm posterior to the original insertion, using direct scleral passes in crossed swords fashion. The suture ends were tied securely after the position of the muscle had been checked and found to be accurate. Conjunctiva was closed with two 6-0 plain gut sutures.  Through an inferonasal fornix incision through conjunctiva and Tenon's fascia, the right medial rectus muscle was engaged on a series of muscle hooks and carefully cleared of its fascial attachments to at least 15 mm posterior to the insertion. The muscle was spread between 2 self-retaining hooks. A 2 mm bite was taken of the center of the muscle belly at a measured distance of 7.0 mm posterior to the insertion, and a knot was tied securely at this location. The needle at each end of the double-armed suture was passed from the center  of the muscle belly to the periphery, parallel to and 7.0 mm posterior to the insertion. A resection clamp was placed on the muscle just anterior to the sutures. The muscle was disinserted. Each pole suture was passed posteriorly to anteriorly through the corresponding end of the muscle stump, then anteriorly to posteriorly near the center of the stump, then posteriorly to anteriorly through the center of the muscle belly, just posterior to the previously placed knot.  The muscle was drawn up to the level of the original insertion, and all slack was removed.  The suture ends were tied securely. The resection clamp was removed.  The portion of the muscle anterior to the sutures was carefully excised. Conjunctiva was closed with two 6-0 plain gut sutures. Tobradex ophthalmic ointment was placed in right eye(s). The patient was awakened without difficulty and taken to the recovery room in stable condition, having suffered no intraoperative or immediate postoperative complications. Derry Skill, MD

## 2020-10-25 NOTE — H&P (View-Only) (Signed)
Date of examination:  09-27-20  Indication for surgery: to straighten the eyes and relieve diplopia  Pertinent past medical history:  Past Medical History:  Diagnosis Date  . Anxiety    OA multiple joints  . Arthritis   . Atrial fibrillation (HCC)   . BPH (benign prostatic hyperplasia)   . COPD (chronic obstructive pulmonary disease) (HCC)    uses inhaler  . DDD (degenerative disc disease), lumbar   . Diabetes mellitus without complication (HCC)   . Fatty liver   . GERD (gastroesophageal reflux disease)   . Hyperlipidemia   . Hypertension     Pertinent ocular history:  Left eye drifts out x years, worse in past 3-5 years, + diplopia  Pertinent family history:  Family History  Problem Relation Age of Onset  . Cancer Mother        bladder  . Diabetes Father   . Cancer Sister        breast   . Colon cancer Neg Hx     General:  Healthy appearing patient in no distress.    Eyes:    Acuity [place]  OD 20/20  OS 20/40  External: Within normal limits     Anterior segment: Within normal limits x PCIOL OU  Motility:   X(T)=37 comitant, LH(T) flick, X(T)'45  Fundus: deferred  Refraction:   2.00 D ATR cyl OU    Heart: Regular rate and rhythm without murmur     Lungs: Clear to auscultation      Impression:Intermittent exotropia, convergence insufficiency type, with diplopia  Plan: Lateral rectus muscle recession both eyes  Nicolas Berry  

## 2020-10-25 NOTE — H&P (Signed)
Date of examination:  09-27-20  Indication for surgery: to straighten the eyes and relieve diplopia  Pertinent past medical history:  Past Medical History:  Diagnosis Date  . Anxiety    OA multiple joints  . Arthritis   . Atrial fibrillation ([place])   . BPH (benign prostatic hyperplasia)   . COPD (chronic obstructive pulmonary disease) (HCC)    uses inhaler  . DDD (degenerative disc disease), lumbar   . Diabetes mellitus without complication ([place])   . Fatty liver   . GERD (gastroesophageal reflux disease)   . Hyperlipidemia   . Hypertension     Pertinent ocular history:  Left eye drifts out x years, worse in past 3-5 years, + diplopia  Pertinent family history:  Family History  Problem Relation Age of Onset  . Cancer Mother        bladder  . Diabetes Father   . Cancer Sister        breast   . Colon cancer Neg Hx     General:  Healthy appearing patient in no distress.    Eyes:    Acuity [place]  OD 20/20  OS 20/40  External: Within normal limits     Anterior segment: Within normal limits x PCIOL OU  Motility:   X(T)=37 comitant, LH(T) flick, F(O)'27  Fundus: deferred  Refraction:   2.00 D ATR cyl OU    Heart: Regular rate and rhythm without murmur     Lungs: Clear to auscultation      Impression:Intermittent exotropia, convergence insufficiency type, with diplopia  Plan: Lateral rectus muscle recession both eyes  Derry Skill

## 2020-10-25 NOTE — Discharge Instructions (Signed)
Diet: Clear liquids, advance to soft foods then regular diet as tolerated by this evening.  Pain control:  Ice pack/cold compress to operated eye(s)  Acetaminophen 500-700mg  every 4-6 hours as needed   Eye medications:   Tobradex or Zylet eye ointment 1/2 inch in operated eye(s) twice a day   Activity: No swimming for 1 week.  It is OK to let water run over the face and eyes while showering or taking a bath, even during the first week.  No other restriction on exercise or activity.   Call Dr. Janee Morn office [phone] with any problems or concerns.   No Tylenol before 2:15pm No ibuprofen before 3:15pm   Post Anesthesia Home Care Instructions  Activity: Get plenty of rest for the remainder of the day. A responsible individual must stay with you for 24 hours following the procedure.  For the next 24 hours, DO NOT: -Drive a car -Paediatric nurse -Drink alcoholic beverages -Take any medication unless instructed by your physician -Make any legal decisions or sign important papers.  Meals: Start with liquid foods such as gelatin or soup. Progress to regular foods as tolerated. Avoid greasy, spicy, heavy foods. If nausea and/or vomiting occur, drink only clear liquids until the nausea and/or vomiting subsides. Call your physician if vomiting continues.  Special Instructions/Symptoms: Your throat may feel dry or sore from the anesthesia or the breathing tube placed in your throat during surgery. If this causes discomfort, gargle with warm salt water. The discomfort should disappear within 24 hours.  If you had a scopolamine patch placed behind your ear for the management of post- operative nausea and/or vomiting:  1. The medication in the patch is effective for 72 hours, after which it should be removed.  Wrap patch in a tissue and discard in the trash. Wash hands thoroughly with soap and water. 2. You may remove the patch earlier than 72 hours if you experience unpleasant side  effects which may include dry mouth, dizziness or visual disturbances. 3. Avoid touching the patch. Wash your hands with soap and water after contact with the patch.

## 2020-10-25 NOTE — Anesthesia Postprocedure Evaluation (Signed)
Anesthesia Post Note  Patient: Nicolas Berry  Procedure(s) Performed: STRABISMUS REPAIR LEFT EYE (Left Eye)     Patient location during evaluation: PACU Anesthesia Type: General Level of consciousness: sedated and patient cooperative Pain management: pain level controlled Vital Signs Assessment: post-procedure vital signs reviewed and stable Respiratory status: spontaneous breathing Cardiovascular status: stable Anesthetic complications: no   No complications documented.  Last Vitals:  Vitals:   10/25/20 1110 10/25/20 1137  BP:  140/75  Pulse: 73 95  Resp: 19 20  Temp:  36.5 C  SpO2: 100% 95%    Last Pain:  Vitals:   10/25/20 1137  TempSrc:   PainSc: 0-No pain                 Nolon Nations

## 2020-10-25 NOTE — Anesthesia Procedure Notes (Signed)
Procedure Name: LMA Insertion Date/Time: 10/25/2020 8:58 AM Performed by: Willa Frater, CRNA Pre-anesthesia Checklist: Patient identified, Emergency Drugs available, Suction available and Patient being monitored Patient Re-evaluated:Patient Re-evaluated prior to induction Oxygen Delivery Method: Circle system utilized Preoxygenation: Pre-oxygenation with 100% oxygen Induction Type: IV induction Ventilation: Mask ventilation without difficulty LMA: LMA flexible inserted LMA Size: 4.0 Number of attempts: 1 Airway Equipment and Method: Bite block Placement Confirmation: positive ETCO2 Tube secured with: Tape Dental Injury: Teeth and Oropharynx as per pre-operative assessment

## 2020-10-25 NOTE — Transfer of Care (Signed)
Immediate Anesthesia Transfer of Care Note  Patient: Nicolas Berry  Procedure(s) Performed: STRABISMUS REPAIR LEFT EYE (Left Eye)  Patient Location: PACU  Anesthesia Type:General  Level of Consciousness: awake, drowsy and patient cooperative  Airway & Oxygen Therapy: Patient Spontanous Breathing and Patient connected to face mask oxygen  Post-op Assessment: Report given to RN and Post -op Vital signs reviewed and stable  Post vital signs: Reviewed and stable  Last Vitals:  Vitals Value Taken Time  BP    Temp    Pulse 64 10/25/20 1031  Resp 11 10/25/20 1031  SpO2 100 % 10/25/20 1031  Vitals shown include unvalidated device data.  Last Pain:  Vitals:   10/25/20 0755  TempSrc: Oral  PainSc: 2       Patients Stated Pain Goal: 6 (07/24/14 9458)  Complications: No complications documented.

## 2020-10-25 NOTE — Interval H&P Note (Signed)
History and Physical Interval Note:  10/25/2020 8:30 AM  Nicolas Berry  has presented today for surgery, with the diagnosis of EXOTROPIA.  The various methods of treatment have been discussed with the patient and family. After consideration of risks, benefits and other options for treatment, the patient has consented to  Procedure(s): REPAIR STRABISMUS (Left) as a surgical intervention.  The patient's history has been reviewed, patient examined, no change in status, stable for surgery.  I have reviewed the patient's chart and labs.  Questions were answered to the patient's satisfaction.     Derry Skill

## 2020-10-28 ENCOUNTER — Encounter (HOSPITAL_BASED_OUTPATIENT_CLINIC_OR_DEPARTMENT_OTHER): Payer: Self-pay | Admitting: Ophthalmology

## 2020-10-30 ENCOUNTER — Telehealth: Payer: Self-pay | Admitting: Internal Medicine

## 2020-10-30 NOTE — Telephone Encounter (Signed)
FYI: this pt phoned to advise me today that he just started his Prevpac which he was suppose to start on 10/08/2020.on the 16th of January it was called in but it cost too much with his insurance so he waited until he got another insurance and he purchased Rx for $3.00. So now he was just making sure of his PPI medication instructions.

## 2020-10-30 NOTE — Telephone Encounter (Signed)
NOTED Made pt aware of these instructions when I spoke with the patient.

## 2020-10-30 NOTE — Telephone Encounter (Signed)
Pt has questions about his medication. Please call [phone]

## 2020-10-30 NOTE — Telephone Encounter (Signed)
Patient will need to hold current PPI until after full 14-day course at which time he  may resume PPI therapy.

## 2020-11-19 ENCOUNTER — Encounter: Payer: Self-pay | Admitting: Internal Medicine

## 2021-01-16 ENCOUNTER — Encounter: Payer: Self-pay | Admitting: Cardiology

## 2021-01-28 ENCOUNTER — Encounter: Payer: Self-pay | Admitting: *Deleted

## 2021-01-28 ENCOUNTER — Ambulatory Visit (INDEPENDENT_AMBULATORY_CARE_PROVIDER_SITE_OTHER): Payer: No Typology Code available for payment source | Admitting: Cardiology

## 2021-01-28 ENCOUNTER — Encounter: Payer: Self-pay | Admitting: Cardiology

## 2021-01-28 VITALS — BP 130/80 | HR 68 | Ht 70.0 in | Wt 158.4 lb

## 2021-01-28 DIAGNOSIS — I10 Essential (primary) hypertension: Secondary | ICD-10-CM

## 2021-01-28 DIAGNOSIS — I2510 Atherosclerotic heart disease of native coronary artery without angina pectoris: Secondary | ICD-10-CM

## 2021-01-28 DIAGNOSIS — I480 Paroxysmal atrial fibrillation: Secondary | ICD-10-CM | POA: Diagnosis not present

## 2021-01-28 DIAGNOSIS — E782 Mixed hyperlipidemia: Secondary | ICD-10-CM

## 2021-01-28 NOTE — Patient Instructions (Signed)
Your physician recommends that you schedule a follow-up appointment in: 6 MONTHS WITH DR BRANCH  Your physician recommends that you continue on your current medications as directed. Please refer to the Current Medication list given to you today.  Thank you for choosing [facility]!!    

## 2021-01-28 NOTE — Progress Notes (Signed)
Clinical Summary Nicolas Berry is a 75 y.o.male seen today for follow upof the following medical problems.  1. Afib/PAF - previously seenin afib clinic - rate control with coreg, eliquis for stroke prevention.   - recent issues with anemia and heme + stools, has been off eliquis -  GI work up with EGD with gastritis, and colonscopy polyps no source of bleeding - [facility], reports Hgb was 16. Was told stopped to iron. - no recent palpitations.    2. CAD - incidental finding on recent CT PE - 04/2016 nuclear stress test no ischemia   - some chest pain with taking pills at times.  - no exertional symptoms   3. Esophageal thickening - incidental finding on CT scan - followed by pcp and GI  4. COPD - emphsymatous changes noted on recent CT PE - followed by pcp   5. Leg pains - occurs at rest. Occurs in thighs and calves.  - can be worst with walking. 01/2018 ABI right 0.98, left 0.92   6. HTN - he is compliant with meds   7. Hyperlipidemia - [facility], compliant with statin   Past Medical History:  Diagnosis Date  . Anemia   . Anxiety    OA multiple joints  . Arthritis   . Atrial fibrillation ([place])   . BPH (benign prostatic hyperplasia)   . COPD (chronic obstructive pulmonary disease) (HCC)    uses inhaler  . DDD (degenerative disc disease), lumbar   . Diabetes mellitus without complication ([place])   . Fatty liver   . GERD (gastroesophageal reflux disease)   . Hyperlipidemia   . Hypertension      No Known Allergies   Current Outpatient Medications  Medication Sig Dispense Refill  . acarbose (PRECOSE) 100 MG tablet Take 100 mg by mouth daily.     . carvedilol (COREG) 3.125 MG tablet Take 1 tablet (3.125 mg total) by mouth 2 (two) times daily. 60 tablet 6  . Cholecalciferol 25 MCG (1000 UT) tablet Take 1,000 Units by mouth daily.    . DULoxetine (CYMBALTA) 60 MG capsule Take 60 mg by mouth daily.    .  ferrous sulfate 325 (65 FE) MG tablet Take 325 mg by mouth 2 (two) times daily with a meal.    . finasteride (PROSCAR) 5 MG tablet Take 5 mg by mouth daily.    . Fluticasone-Salmeterol (ADVAIR) 250-50 MCG/DOSE AEPB Inhale 1 puff into the lungs 2 (two) times daily.    Marland Kitchen gabapentin (NEURONTIN) 300 MG capsule Take 300 mg by mouth 2 (two) times daily.   1  . HYDROcodone-acetaminophen (NORCO) 7.5-325 MG tablet Take 1 tablet by mouth 4 (four) times daily.    Marland Kitchen losartan (COZAAR) 25 MG tablet Take 1 tablet (25 mg total) by mouth daily. 90 tablet 3  . metFORMIN (GLUCOPHAGE) 1000 MG tablet Take 500 mg by mouth 2 (two) times daily with a meal.    . pantoprazole (PROTONIX) 40 MG tablet Take 1 tablet (40 mg total) by mouth 2 (two) times daily before a meal. 60 tablet 5  . rosuvastatin (CRESTOR) 40 MG tablet Take 40 mg by mouth daily.    . traZODone (DESYREL) 100 MG tablet Take 50 mg by mouth at bedtime.     No current facility-administered medications for this visit.     Past Surgical History:  Procedure Laterality Date  . BACK SURGERY     C6-C7  . BALLOON  DILATION N/A 10/01/2020   Procedure: BALLOON DILATION;  Surgeon: Eloise Harman, DO;  [facility];  Service: Endoscopy;  Laterality: N/A;  . BIOPSY  10/01/2020   Procedure: BIOPSY;  Surgeon: Eloise Harman, DO;  [facility];  Service: Endoscopy;;  gastric  . COLONOSCOPY WITH PROPOFOL N/A 10/01/2020   Procedure: COLONOSCOPY WITH PROPOFOL;  Surgeon: Eloise Harman, DO;  [facility];  Service: Endoscopy;  Laterality: N/A;  2:15pm, pt know new time per office  . ESOPHAGOGASTRODUODENOSCOPY (EGD) WITH PROPOFOL N/A 10/01/2020   Procedure: ESOPHAGOGASTRODUODENOSCOPY (EGD) WITH PROPOFOL;  Surgeon: Eloise Harman, DO;  [facility];  Service: Endoscopy;  Laterality: N/A;  . HERNIA REPAIR     left inguinal  . POLYPECTOMY  10/01/2020   Procedure: POLYPECTOMY INTESTINAL;  Surgeon: Eloise Harman, DO;   [facility];  Service: Endoscopy;;  . STRABISMUS SURGERY Left 10/25/2020   Procedure: STRABISMUS REPAIR LEFT EYE;  Surgeon: Everitt Amber, MD;  Location: [place];  Service: Ophthalmology;  Laterality: Left;  . TONSILLECTOMY       No Known Allergies    Family History  Problem Relation Age of Onset  . Cancer Mother        bladder  . Diabetes Father   . Cancer Sister        breast   . Colon cancer Neg Hx      Social History Nicolas Berry reports that he has been smoking cigarettes. He started smoking about 60 years ago. He has been smoking about 1.50 packs per day. He has never used smokeless tobacco. Nicolas Berry reports current alcohol use.   Review of Systems CONSTITUTIONAL: No weight loss, fever, chills, weakness or fatigue.  HEENT: Eyes: No visual loss, blurred vision, double vision or yellow sclerae.No hearing loss, sneezing, congestion, runny nose or sore throat.  SKIN: No rash or itching.  CARDIOVASCULAR: per hpi RESPIRATORY: No shortness of breath, cough or sputum.  GASTROINTESTINAL: No anorexia, nausea, vomiting or diarrhea. No abdominal pain or blood.  GENITOURINARY: No burning on urination, no polyuria NEUROLOGICAL: No headache, dizziness, syncope, paralysis, ataxia, numbness or tingling in the extremities. No change in bowel or bladder control.  MUSCULOSKELETAL: No muscle, back pain, joint pain or stiffness.  LYMPHATICS: No enlarged nodes. No history of splenectomy.  PSYCHIATRIC: No history of depression or anxiety.  ENDOCRINOLOGIC: No reports of sweating, cold or heat intolerance. No polyuria or polydipsia.  Marland Kitchen   Physical Examination Today's Vitals   01/28/21 1147  BP: 130/80  Pulse: 68  SpO2: 92%  Weight: 158 lb 6.4 oz (71.8 kg)  Height: 5\' 10"  (1.778 m)   Body mass index is 22.73 kg/m.  Gen: resting comfortably, no acute distress HEENT: no scleral icterus, pupils equal round and reactive, no palptable cervical adenopathy,   CV: RRR, no m/r/g, no jvd Resp: Clear to auscultation bilaterally GI: abdomen is soft, non-tender, non-distended, normal bowel sounds, no hepatosplenomegaly MSK: extremities are warm, no edema.  Skin: warm, no rash Neuro:  no focal deficits Psych: appropriate affect   Diagnostic Studies  04/2016 echo Study Conclusions  - Left ventricle: Turbulent flow through LVOT. Systolic anterior motio of MV leaflets Peak gradient through LV/LVOT /AV is 15 mm Hg at rest No signifiant obstruction at rest. The cavity size was normal. Wall thickness was increased in a pattern of mild LVH. Systolic function was vigorous. The estimated ejection fraction was in the range of 65% to 70%.  Doppler parameters are consistent with abnormal left ventricular relaxation (grade 1 diastolic dysfunction). - Aortic valve: AV is thickened, calcified with minimally restricted motion. - Mitral valve: SAM of mitral valve leaflets. Calcified annulus. Mildly thickened leaflets . - Left atrium: The atrium was mildly dilated. - Pulmonary arteries: PA peak pressure: 32 mm Hg (S).  04/2016 CT PE IMPRESSION: 1. No evidence of acute pulmonary embolism. 2. Mild cardiomegaly. Left ventricular hypertrophy. Mild aortic valvular calcification. 3. Moderate three-vessel coronary atherosclerosis and mild aortic atherosclerosis. 4. Mild circumferential wall thickening involving the upper thoracic esophagus and of the distal esophagus. Query esophagitis as no distinct mass is identified. Upper endoscopy may be confirmatory. 5. COPD/emphysema. No acute cardiopulmonary disease.    04/2016 Nuclear stress  There was no ST segment deviation noted during stress.  No T wave inversion was noted during stress.  The study is normal.  This is a low risk study.  The left ventricular ejection fraction is hyperdynamic (>65%).   01/2018 ABIs FINDINGS: Right ABI: 0.98  Left ABI: 0.92  Right Lower  Extremity: Normal arterial waveforms at the ankle.  Left Lower Extremity: Normal arterial waveforms at the ankle.  IMPRESSION: 1. Borderline normal bilateral ABIs. Consider postexercise testing to exclude occult arterial occlusive disease.   07/2020 echo IMPRESSIONS    1. Left ventricular ejection fraction, by estimation, is 60 to 65%. The  left ventricle has normal function. The left ventricle has no regional  wall motion abnormalities. There is moderate left ventricular hypertrophy.  Left ventricular diastolic  parameters were normal.  2. Right ventricular systolic function is normal. The right ventricular  size is normal. There is normal pulmonary artery systolic pressure.  3. Left atrial size was mildly dilated.  4. Chordal SAM without LVOT gradient. The mitral valve is degenerative.  Mild mitral valve regurgitation. No evidence of mitral stenosis. Severe  mitral annular calcification.  5. The aortic valve is tricuspid. There is moderate calcification of the  aortic valve. There is moderate thickening of the aortic valve. Aortic  valve regurgitation is trivial. Mild aortic valve stenosis.  6. The inferior vena cava is dilated in size with >50% respiratory  variability, suggesting right atrial pressure of 8 mmHg.     Assessment and Plan  1. Afib - no symptoms - will check with GI and also obtain labs from [place] in regards to retrying his eliquis.   2. CAD - noted on CT scan, nuclear stress test shows no significant ischemia - no recent symptoms, continue to monitor  3.HTN -at goal, continue current meds  4. Hyperlipidemia - continue current meds, request pcp labs    Arnoldo Lenis, M.D.

## 2021-02-02 ENCOUNTER — Other Ambulatory Visit: Payer: Self-pay | Admitting: Family Medicine

## 2021-02-11 DIAGNOSIS — H5000 Unspecified esotropia: Secondary | ICD-10-CM | POA: Diagnosis not present

## 2021-02-11 DIAGNOSIS — H532 Diplopia: Secondary | ICD-10-CM | POA: Diagnosis not present

## 2021-02-11 DIAGNOSIS — H401122 Primary open-angle glaucoma, left eye, moderate stage: Secondary | ICD-10-CM | POA: Diagnosis not present

## 2021-02-11 DIAGNOSIS — H401111 Primary open-angle glaucoma, right eye, mild stage: Secondary | ICD-10-CM | POA: Diagnosis not present

## 2021-02-20 ENCOUNTER — Telehealth: Payer: Self-pay | Admitting: *Deleted

## 2021-02-20 NOTE — Telephone Encounter (Signed)
Spoke with pt.  States he received a call this morning about labs from the [place] for restarting Eliquis.  He states he ahs an appt with them on 02/27/21 for f/u and labs.  He will call with lab results after he see's them on 6/9.

## 2021-02-20 NOTE — Telephone Encounter (Signed)
Patient called said he is on Eliquis(did not see on his med list). He said the [facility] needs results from his last Hemoglobin. He said someone had called him. He can be reached at [phone].

## 2021-02-20 NOTE — Telephone Encounter (Signed)
I have already placed call to the [place] in [place] and requested labs on 5/10 again on 5/18 and today with no labs sent - spoke with pt and he is having labs done again on 6/9 - pt is not currently taking Eliqius

## 2021-02-25 ENCOUNTER — Telehealth: Payer: Self-pay | Admitting: Cardiology

## 2021-02-25 NOTE — Telephone Encounter (Signed)
Labs in Dr. Harl Bowie results basket for review.

## 2021-02-25 NOTE — Telephone Encounter (Signed)
Recent labs scanned in from the [place].   Patient needs to know if he will be going back on Eliquis.

## 2021-02-28 NOTE — Telephone Encounter (Signed)
Lmtcb.

## 2021-02-28 NOTE — Telephone Encounter (Signed)
Pt called back to inform us that he had lab work for the [place] completed yesterday, but it will not come back until Monday or Tuesday of next week.

## 2021-03-06 ENCOUNTER — Telehealth: Payer: Self-pay | Admitting: Cardiology

## 2021-03-06 ENCOUNTER — Other Ambulatory Visit: Payer: Self-pay | Admitting: Gastroenterology

## 2021-03-06 NOTE — Telephone Encounter (Signed)
Noted, thank you

## 2021-03-06 NOTE — Telephone Encounter (Signed)
Patient was returning call from Friday from [facility] about his lab results, he has not received them from the [place] yet , he called them again yesterday

## 2021-03-13 NOTE — Telephone Encounter (Signed)
Needs office visit.

## 2021-03-14 ENCOUNTER — Encounter: Payer: Self-pay | Admitting: Internal Medicine

## 2021-04-04 ENCOUNTER — Other Ambulatory Visit (HOSPITAL_COMMUNITY): Payer: Self-pay | Admitting: Nephrology

## 2021-04-04 DIAGNOSIS — I129 Hypertensive chronic kidney disease with stage 1 through stage 4 chronic kidney disease, or unspecified chronic kidney disease: Secondary | ICD-10-CM

## 2021-04-04 DIAGNOSIS — E1122 Type 2 diabetes mellitus with diabetic chronic kidney disease: Secondary | ICD-10-CM

## 2021-04-10 ENCOUNTER — Ambulatory Visit (HOSPITAL_COMMUNITY)
Admission: RE | Admit: 2021-04-10 | Discharge: 2021-04-10 | Disposition: A | Discharge status: Home / Self Care | Payer: No Typology Code available for payment source | Source: Ambulatory Visit | Attending: Nephrology | Admitting: Nephrology

## 2021-04-10 ENCOUNTER — Other Ambulatory Visit: Payer: Self-pay

## 2021-04-10 DIAGNOSIS — E1122 Type 2 diabetes mellitus with diabetic chronic kidney disease: Secondary | ICD-10-CM | POA: Insufficient documentation

## 2021-04-10 DIAGNOSIS — I129 Hypertensive chronic kidney disease with stage 1 through stage 4 chronic kidney disease, or unspecified chronic kidney disease: Secondary | ICD-10-CM | POA: Diagnosis present

## 2021-04-17 ENCOUNTER — Ambulatory Visit (INDEPENDENT_AMBULATORY_CARE_PROVIDER_SITE_OTHER): Payer: No Typology Code available for payment source | Admitting: Orthopedic Surgery

## 2021-04-17 ENCOUNTER — Encounter: Payer: Self-pay | Admitting: Orthopedic Surgery

## 2021-04-17 VITALS — BP 156/79 | HR 70 | Ht 70.0 in | Wt 153.0 lb

## 2021-04-17 DIAGNOSIS — F17210 Nicotine dependence, cigarettes, uncomplicated: Secondary | ICD-10-CM | POA: Diagnosis not present

## 2021-04-17 DIAGNOSIS — M1711 Unilateral primary osteoarthritis, right knee: Secondary | ICD-10-CM | POA: Diagnosis not present

## 2021-04-17 DIAGNOSIS — M1710 Unilateral primary osteoarthritis, unspecified knee: Secondary | ICD-10-CM

## 2021-04-17 NOTE — Patient Instructions (Signed)
You have decided to proceed with knee replacement surgery. You have decided not to continue with nonoperative measures such as but not limited to oral medication, weight loss, activity modification, physical therapy, bracing, or injection.  We will perform the procedure commonly known as total knee replacement. Some of the risks associated with knee replacement surgery include but are not limited to Bleeding Infection Swelling Stiffness Blood clot Pulmonary embolism  Loosening of the implant Pain that persists even after surgery  Infection is especially devastating complication of knee surgery although rare. If infection does occur your implant will usually have to be removed and several surgeries and antibiotics will be needed to eradicate the infection prior to performing a repeat replacement.   In some cases amputation is required to eradicate the infection. In other rare cases a knee fusion is needed   In compliance with recent [place] law in federal regulation regarding opioid use and abuse and addiction, we will taper (stop) opioid medication after 2 weeks.  If you're not comfortable with these risks and would like to continue with nonoperative treatment please let Dr. Mansoor Hillyard know prior to your surgery.  

## 2021-04-17 NOTE — Progress Notes (Signed)
NEW PROBLEM//OFFICE VISIT  Summary assessment and plan:   Encounter Diagnoses  Name Primary?   Smoking greater than 20 pack years    Primary localized osteoarthritis of knee Yes    Several things we have to do it Nicolas Berry No  1.  Is going to stop smoking for 30 days #2 we can get a medical consult to clear him for surgery for total knee #3 were going to inject his knee #4 begin to start him on anti-inflammatories  A steroid injection was performed at right knee lateral approach using 1% plain Lidocaine and 6 mg of Celestone. This was well tolerated.   No orders of the defined types were placed in this encounter.  Chief Complaint  Patient presents with   Knee Pain    Right for 2-3 years getting worse, has swelling medial knee area    75 year old male presents with 3-year history of progressively worsening right knee pain.  He complains of pain over the medial aspect of the right knee he denies any prior treatment although he is on hydrocodone for some reason which she does not remember why it was started  He says he might take 1 a day sometimes 2 but does not take it too often often using ibuprofen which she says actually works better      Nicolas  A.  Encounter Diagnoses  Name Primary?   Smoking greater than 20 pack years    Primary localized osteoarthritis of knee Yes    B. DATA ANALYSED:   IMAGING: Interpretation of images: External images are available for review [facility] and lateral right knee from June 22 and x-rays from 2017  Both x-rays show varus alignment to the right knee joint space narrowing medially approximately 50% and then secondary bone changes of osteophyte formation medially    Orders: No new orders  Outside records reviewed: Brief records from the [place] with an x-ray report   C. MANAGEMENT   As above  No orders of the defined types were placed in this encounter.    BP (!) 156/79   Pulse 70   Ht '5\' 10"'$  (1.778 m)   Wt 153  lb (69.4 kg)   BMI 21.95 kg/m    General appearance: Well-developed well-nourished no gross deformities  Cardiovascular normal pulse and perfusion normal color without edema  Neurologically no sensation loss or deficits or pathologic reflexes  Psychological: Awake alert and oriented x3 mood and affect normal  Skin no lacerations or ulcerations no nodularity no palpable masses, no erythema or nodularity  Musculoskeletal: The right knee has excellent range of motion but is in varus approximate 5 degrees there is tenderness along the medial joint line with a large swollen soft tissue reactive bursitis the knee is otherwise stable with no pseudolaxity   ROS Shortness of breath previous history of atrial fibrillation urgency dizziness easy bleeding and bruising which seem to be related to the blood thinner conjunctivitis noninfectious complaints of redness in the eyes  Past Medical History:  Diagnosis Date   Anemia    Anxiety    OA multiple joints   Arthritis    Atrial fibrillation (HCC)    BPH (benign prostatic hyperplasia)    COPD (chronic obstructive pulmonary disease) (HCC)    uses inhaler   DDD (degenerative disc disease), lumbar    Diabetes mellitus without complication (HCC)    Fatty liver    GERD (gastroesophageal reflux disease)    Hyperlipidemia    Hypertension  Past Surgical History:  Procedure Laterality Date   BACK SURGERY     C6-C7   BALLOON DILATION N/A 10/01/2020   Procedure: BALLOON DILATION;  Surgeon: Eloise Harman, DO;  [facility];  Service: Endoscopy;  Laterality: N/A;   BIOPSY  10/01/2020   Procedure: BIOPSY;  Surgeon: Eloise Harman, DO;  [facility];  Service: Endoscopy;;  gastric   COLONOSCOPY WITH PROPOFOL N/A 10/01/2020   Procedure: COLONOSCOPY WITH PROPOFOL;  Surgeon: Eloise Harman, DO;  [facility];  Service: Endoscopy;  Laterality: N/A;  2:15pm, pt know new time per office    ESOPHAGOGASTRODUODENOSCOPY (EGD) WITH PROPOFOL N/A 10/01/2020   Procedure: ESOPHAGOGASTRODUODENOSCOPY (EGD) WITH PROPOFOL;  Surgeon: Eloise Harman, DO;  [facility];  Service: Endoscopy;  Laterality: N/A;   HERNIA REPAIR     left inguinal   POLYPECTOMY  10/01/2020   Procedure: POLYPECTOMY INTESTINAL;  Surgeon: Eloise Harman, DO;  [facility];  Service: Endoscopy;;   STRABISMUS SURGERY Left 10/25/2020   Procedure: STRABISMUS REPAIR LEFT EYE;  Surgeon: Everitt Amber, MD;  Location: [place];  Service: Ophthalmology;  Laterality: Left;   TONSILLECTOMY      Family History  Problem Relation Age of Onset   Cancer Mother        bladder   Diabetes Father    Cancer Sister        breast    Colon cancer Neg Hx    Social History   Tobacco Use   Smoking status: Every Day    Packs/day: 1.50    Types: Cigarettes    Start date: 08/06/1960   Smokeless tobacco: Never  Vaping Use   Vaping Use: Some days  Substance Use Topics   Alcohol use: Yes    Comment: rarely   Drug use: Never    No Known Allergies  Current Meds  Medication Sig   acarbose (PRECOSE) 100 MG tablet Take 100 mg by mouth daily.    albuterol (VENTOLIN HFA) 108 (90 Base) MCG/ACT inhaler Inhale into the lungs every 6 (six) hours as needed for wheezing or shortness of breath.   carvedilol (COREG) 3.125 MG tablet TAKE 1 TABLET BY MOUTH TWICE DAILY STOPPING  CARDIZEM   Cholecalciferol 25 MCG (1000 UT) tablet Take 1,000 Units by mouth daily.   DULoxetine (CYMBALTA) 60 MG capsule Take 60 mg by mouth daily.   finasteride (PROSCAR) 5 MG tablet Take 5 mg by mouth daily.   Fluticasone-Salmeterol (ADVAIR) 250-50 MCG/DOSE AEPB Inhale 1 puff into the lungs 2 (two) times daily.   gabapentin (NEURONTIN) 300 MG capsule Take 300 mg by mouth 2 (two) times daily.    HYDROcodone-acetaminophen (NORCO) 7.5-325 MG tablet Take 1 tablet by mouth 4 (four) times daily.   losartan (COZAAR) 25 MG tablet  Take 25 mg by mouth daily.   metFORMIN (GLUCOPHAGE) 500 MG tablet Take by mouth.   pantoprazole (PROTONIX) 40 MG tablet Take 1 tablet by mouth once daily   rosuvastatin (CRESTOR) 40 MG tablet Take 40 mg by mouth daily.   traZODone (DESYREL) 100 MG tablet Take 50 mg by mouth at bedtime.        Arther Abbott, MD  04/17/2021 12:43 PM

## 2021-05-15 ENCOUNTER — Ambulatory Visit (INDEPENDENT_AMBULATORY_CARE_PROVIDER_SITE_OTHER): Payer: No Typology Code available for payment source | Admitting: Orthopedic Surgery

## 2021-05-15 ENCOUNTER — Other Ambulatory Visit: Payer: Self-pay

## 2021-05-15 ENCOUNTER — Encounter: Payer: Self-pay | Admitting: Orthopedic Surgery

## 2021-05-15 VITALS — BP 136/81 | HR 69 | Ht 70.5 in | Wt 152.4 lb

## 2021-05-15 DIAGNOSIS — M1711 Unilateral primary osteoarthritis, right knee: Secondary | ICD-10-CM | POA: Diagnosis not present

## 2021-05-15 DIAGNOSIS — M1710 Unilateral primary osteoarthritis, unspecified knee: Secondary | ICD-10-CM

## 2021-05-15 NOTE — Progress Notes (Signed)
EVALUATION AND MANAGEMENT   Type of appointment : follow up   Assessment and Plan:  Encounter Diagnosis  Name Primary?   Primary localized osteoarthritis of knee Yes    Nicolas Berry has to get eye surgery before he can have knee surgery he still working on trying to stop smoking is advised to continue his diligent glucose management  He is agreeable to calling us when he is ready to have surgery  No orders of the defined types were placed in this encounter.    Chief Complaint  Patient presents with   Knee Pain    Right knee/preop   Nicolas Berry got good relief from his injection but only for 57-1/2 weeks  75 year old male with end-stage arthritis of the knee here today in follow-up to address interventions we suggested to get him to the surgical suite  1.  We have to address his smoking history he may need a nicotine test  2.  Medical clearance is needed for surgery  3.  He did start some anti-inflammatory medications  4.  He got an injection in his knee    As far as his knee goes he has had 3 years of progressively worsening right medial knee pain he was on hydrocodone but not sure the reason why primarily takes the ibuprofen because he says it works better   Physical Exam Musculoskeletal:     Comments: Varus deformity right knee with prominent medial femoral condyle medial joint line tenderness flexion contracture and decreased flexion overall  Procedure note  Injection  Verbal consent was obtained to inject the right knee  Timeout procedure was completed to confirm injection site  Diagnosis osteoarthritis right knee  Medications used Celestone Lidocaine 1% plain 3 cc  Anesthesia was provided by ethyl chloride spray  Prep was performed with alcohol  Technique of injection knee was injected lateral approach with knee flexion  No complications were noted   Past Medical History:  Diagnosis Date   Anemia    Anxiety    OA multiple joints   Arthritis     Atrial fibrillation (HCC)    BPH (benign prostatic hyperplasia)    COPD (chronic obstructive pulmonary disease) ([place])    uses inhaler   DDD (degenerative disc disease), lumbar    Diabetes mellitus without complication ([place])    Fatty liver    GERD (gastroesophageal reflux disease)    Hyperlipidemia    Hypertension    Past Surgical History:  Procedure Laterality Date   BACK SURGERY     C6-C7   BALLOON DILATION N/A 10/01/2020   Procedure: BALLOON DILATION;  Surgeon: Eloise Harman, DO;  [facility];  Service: Endoscopy;  Laterality: N/A;   BIOPSY  10/01/2020   Procedure: BIOPSY;  Surgeon: Eloise Harman, DO;  [facility];  Service: Endoscopy;;  gastric   COLONOSCOPY WITH PROPOFOL N/A 10/01/2020   Procedure: COLONOSCOPY WITH PROPOFOL;  Surgeon: Eloise Harman, DO;  [facility];  Service: Endoscopy;  Laterality: N/A;  2:15pm, pt know new time per office   ESOPHAGOGASTRODUODENOSCOPY (EGD) WITH PROPOFOL N/A 10/01/2020   Procedure: ESOPHAGOGASTRODUODENOSCOPY (EGD) WITH PROPOFOL;  Surgeon: Eloise Harman, DO;  [facility];  Service: Endoscopy;  Laterality: N/A;   HERNIA REPAIR     left inguinal   POLYPECTOMY  10/01/2020   Procedure: POLYPECTOMY INTESTINAL;  Surgeon: Eloise Harman, DO;  [facility];  Service: Endoscopy;;   STRABISMUS SURGERY Left 10/25/2020  Procedure: STRABISMUS REPAIR LEFT EYE;  Surgeon: Everitt Amber, MD;  Location: [facility];  Service: Ophthalmology;  Laterality: Left;   TONSILLECTOMY     Social History   Tobacco Use   Smoking status: Every Day    Packs/day: 1.50    Types: Cigarettes    Start date: 08/06/1960   Smokeless tobacco: Never  Vaping Use   Vaping Use: Some days  Substance Use Topics   Alcohol use: Yes    Comment: rarely   Drug use: Never

## 2021-05-15 NOTE — Patient Instructions (Signed)
Manage smoking and sugar   Call us when you are ready

## 2021-07-17 ENCOUNTER — Telehealth: Payer: Self-pay | Admitting: Orthopedic Surgery

## 2021-07-17 NOTE — Telephone Encounter (Signed)
Patient called to let Dr Aline Brochure know that he will need to wait till early 2022 for scheduling surgery; states he has eye surgery scheduled in November, and will need time after to heal and to be able to drive again.

## 2021-07-17 NOTE — Telephone Encounter (Signed)
Got it - thanks.

## 2021-07-25 ENCOUNTER — Other Ambulatory Visit (HOSPITAL_COMMUNITY): Payer: Self-pay | Admitting: Family Medicine

## 2021-07-25 DIAGNOSIS — R42 Dizziness and giddiness: Secondary | ICD-10-CM

## 2021-07-25 DIAGNOSIS — R531 Weakness: Secondary | ICD-10-CM

## 2021-07-25 DIAGNOSIS — R29898 Other symptoms and signs involving the musculoskeletal system: Secondary | ICD-10-CM

## 2021-08-04 ENCOUNTER — Ambulatory Visit (INDEPENDENT_AMBULATORY_CARE_PROVIDER_SITE_OTHER): Payer: No Typology Code available for payment source | Admitting: Cardiology

## 2021-08-04 ENCOUNTER — Encounter: Payer: Self-pay | Admitting: Cardiology

## 2021-08-04 ENCOUNTER — Other Ambulatory Visit: Payer: Self-pay

## 2021-08-04 VITALS — BP 132/64 | HR 75 | Ht 70.0 in | Wt 154.0 lb

## 2021-08-04 DIAGNOSIS — I4891 Unspecified atrial fibrillation: Secondary | ICD-10-CM | POA: Diagnosis not present

## 2021-08-04 DIAGNOSIS — I10 Essential (primary) hypertension: Secondary | ICD-10-CM | POA: Diagnosis not present

## 2021-08-04 DIAGNOSIS — I2510 Atherosclerotic heart disease of native coronary artery without angina pectoris: Secondary | ICD-10-CM

## 2021-08-04 MED ORDER — APIXABAN 5 MG PO TABS: 5.0000 mg | ORAL_TABLET | Freq: Two times a day (BID) | ORAL | Status: DC

## 2021-08-04 MED ORDER — LOSARTAN POTASSIUM 25 MG PO TABS: ORAL_TABLET | ORAL | Status: DC

## 2021-08-04 NOTE — Patient Instructions (Signed)
Medication Instructions:  Restart Eliquis 5mg  twice a day  Continue all other medications.     Labwork: none  Testing/Procedures: none  Follow-Up: 4 months   Any Other Special Instructions Will Be Listed Below (If Applicable).   If you need a refill on your cardiac medications before your next appointment, please call your pharmacy.

## 2021-08-04 NOTE — Progress Notes (Signed)
Clinical Summary Mr. Mcfadden is a 75 y.o.male seen today for follow up of the following medical problems.    1. Afib/PAF - previously seen in afib clinic - rate control with coreg, eliquis for stroke prevention.     - recent issues with anemia and heme + stools, has been off eliquis -  GI work up with EGD with gastritis, and colonscopy polyps no source of bleeding   - no recent palpitations - has remained off anticoag - denies any recent bleeding issues. Hgb over the last 6+ months across tests from different providers has been 15 or high    2. CAD - incidental finding on recent CT PE - 04/2016 nuclear stress test no ischemia   - no rcent symptomes     3. Esophageal thickening - incidental finding on CT scan - followed by pcp and GI  - ongoing dysphagia    4. COPD - emphsymatous changes noted on recent CT PE - followed by pcp     5. Leg pains - occurs at rest. Occurs in thighs and calves.  - can be worst with walking.   01/2018 ABI right 0.98, left 0.92     6. HTN - compliant with meds     7. Hyperlipidemia - labs followed by [place] - he is on statin  8.CKD - followed by Dr Theador Hawthorne  9. Preop evaluation - considering knee replacement - tolerates above 4 METs without troubles    10. Valvular heart disease - 07/2020 echo LVEF 60-65%, no WMAs, mild MR, very mild AS mean grad 9, AVA VTI 2.64  11. Carotid bruits - pcp has ordered US     Past Medical History:  Diagnosis Date   Anemia    Anxiety    OA multiple joints   Arthritis    Atrial fibrillation (HCC)    BPH (benign prostatic hyperplasia)    COPD (chronic obstructive pulmonary disease) (HCC)    uses inhaler   DDD (degenerative disc disease), lumbar    Diabetes mellitus without complication (HCC)    Fatty liver    GERD (gastroesophageal reflux disease)    Hyperlipidemia    Hypertension      No Known Allergies   Current Outpatient Medications  Medication Sig Dispense Refill    acarbose (PRECOSE) 100 MG tablet Take 100 mg by mouth daily.      albuterol (VENTOLIN HFA) 108 (90 Base) MCG/ACT inhaler Inhale into the lungs every 6 (six) hours as needed for wheezing or shortness of breath.     carvedilol (COREG) 3.125 MG tablet TAKE 1 TABLET BY MOUTH TWICE DAILY STOPPING  CARDIZEM 180 tablet 3   Cholecalciferol 25 MCG (1000 UT) tablet Take 1,000 Units by mouth daily.     DULoxetine (CYMBALTA) 60 MG capsule Take 60 mg by mouth daily.     finasteride (PROSCAR) 5 MG tablet Take 5 mg by mouth daily.     Fluticasone-Salmeterol (ADVAIR) 250-50 MCG/DOSE AEPB Inhale 1 puff into the lungs 2 (two) times daily.     gabapentin (NEURONTIN) 300 MG capsule Take 300 mg by mouth 2 (two) times daily.   1   HYDROcodone-acetaminophen (NORCO) 7.5-325 MG tablet Take 1 tablet by mouth 4 (four) times daily.     losartan (COZAAR) 25 MG tablet Take 25 mg by mouth daily.     metFORMIN (GLUCOPHAGE) 500 MG tablet Take by mouth.     pantoprazole (PROTONIX) 40 MG tablet Take 1 tablet by mouth once daily 30  tablet 3   rosuvastatin (CRESTOR) 40 MG tablet Take 40 mg by mouth daily.     traZODone (DESYREL) 100 MG tablet Take 50 mg by mouth at bedtime.     No current facility-administered medications for this visit.     Past Surgical History:  Procedure Laterality Date   BACK SURGERY     C6-C7   BALLOON DILATION N/A 10/01/2020   Procedure: BALLOON DILATION;  Surgeon: Eloise Harman, DO;  [facility];  Service: Endoscopy;  Laterality: N/A;   BIOPSY  10/01/2020   Procedure: BIOPSY;  Surgeon: Eloise Harman, DO;  [facility];  Service: Endoscopy;;  gastric   COLONOSCOPY WITH PROPOFOL N/A 10/01/2020   Procedure: COLONOSCOPY WITH PROPOFOL;  Surgeon: Eloise Harman, DO;  [facility];  Service: Endoscopy;  Laterality: N/A;  2:15pm, pt know new time per office   ESOPHAGOGASTRODUODENOSCOPY (EGD) WITH PROPOFOL N/A 10/01/2020   Procedure: ESOPHAGOGASTRODUODENOSCOPY (EGD)  WITH PROPOFOL;  Surgeon: Eloise Harman, DO;  [facility];  Service: Endoscopy;  Laterality: N/A;   HERNIA REPAIR     left inguinal   POLYPECTOMY  10/01/2020   Procedure: POLYPECTOMY INTESTINAL;  Surgeon: Eloise Harman, DO;  [facility];  Service: Endoscopy;;   STRABISMUS SURGERY Left 10/25/2020   Procedure: STRABISMUS REPAIR LEFT EYE;  Surgeon: Everitt Amber, MD;  Location: [facility];  Service: Ophthalmology;  Laterality: Left;   TONSILLECTOMY       No Known Allergies    Family History  Problem Relation Age of Onset   Cancer Mother        bladder   Diabetes Father    Cancer Sister        breast    Colon cancer Neg Hx      Social History Mr. Carrozza reports that he has been smoking cigarettes. He started smoking about 61 years ago. He has been smoking an average of 1.5 packs per day. He has never used smokeless tobacco. Mr. Gilman reports current alcohol use.   Review of Systems CONSTITUTIONAL: No weight loss, fever, chills, weakness or fatigue.  HEENT: Eyes: No visual loss, blurred vision, double vision or yellow sclerae.No hearing loss, sneezing, congestion, runny nose or sore throat.  SKIN: No rash or itching.  CARDIOVASCULAR: per hpi RESPIRATORY: No shortness of breath, cough or sputum.  GASTROINTESTINAL: No anorexia, nausea, vomiting or diarrhea. No abdominal pain or blood.  GENITOURINARY: No burning on urination, no polyuria NEUROLOGICAL: No headache, dizziness, syncope, paralysis, ataxia, numbness or tingling in the extremities. No change in bowel or bladder control.  MUSCULOSKELETAL: No muscle, back pain, joint pain or stiffness.  LYMPHATICS: No enlarged nodes. No history of splenectomy.  PSYCHIATRIC: No history of depression or anxiety.  ENDOCRINOLOGIC: No reports of sweating, cold or heat intolerance. No polyuria or polydipsia.  Marland Kitchen   Physical Examination Today's Vitals   08/04/21 1533  BP: 132/64  Pulse: 75   SpO2: 100%  Weight: 154 lb (69.9 kg)  Height: 5\' 10"  (1.778 m)   Body mass index is 22.1 kg/m.  Gen: resting comfortably, no acute distress HEENT: no scleral icterus, pupils equal round and reactive, no palptable cervical adenopathy,  CV: RRR, 2/6 systolic murmur, no JVD Resp: Clear to auscultation bilaterally GI: abdomen is soft, non-tender, non-distended, normal bowel sounds, no hepatosplenomegaly MSK: extremities are warm, no edema.  Skin: warm, no rash Neuro:  no focal deficits Psych: appropriate affect   Diagnostic Studies  04/2016 echo Study Conclusions   - Left ventricle: Turbulent flow through LVOT. Systolic anterior   motio of MV leaflets Peak gradient through LV/LVOT /AV is 15 mm   Hg at rest No signifiant obstruction at rest. The cavity size was   normal. Wall thickness was increased in a pattern of mild LVH.   Systolic function was vigorous. The estimated ejection fraction   was in the range of 65% to 70%. Doppler parameters are consistent   with abnormal left ventricular relaxation (grade 1 diastolic   dysfunction). - Aortic valve: AV is thickened, calcified with minimally   restricted motion. - Mitral valve: SAM of mitral valve leaflets. Calcified annulus.   Mildly thickened leaflets . - Left atrium: The atrium was mildly dilated. - Pulmonary arteries: PA peak pressure: 32 mm Hg (S).   04/2016 CT PE IMPRESSION: 1. No evidence of acute pulmonary embolism. 2. Mild cardiomegaly. Left ventricular hypertrophy. Mild aortic valvular calcification. 3. Moderate three-vessel coronary atherosclerosis and mild aortic atherosclerosis. 4. Mild circumferential wall thickening involving the upper thoracic esophagus and of the distal esophagus. Query esophagitis as no distinct mass is identified. Upper endoscopy may be confirmatory. 5. COPD/emphysema.  No acute cardiopulmonary disease.       04/2016 Nuclear stress There was no ST segment deviation noted during  stress. No T wave inversion was noted during stress. The study is normal. This is a low risk study. The left ventricular ejection fraction is hyperdynamic (>65%).     01/2018 ABIs FINDINGS: Right ABI:  0.98   Left ABI:  0.92   Right Lower Extremity:  Normal arterial waveforms at the ankle.   Left Lower Extremity:  Normal arterial waveforms at the ankle.   IMPRESSION: 1. Borderline normal bilateral ABIs. Consider postexercise testing to exclude occult arterial occlusive disease.     07/2020 echo IMPRESSIONS     1. Left ventricular ejection fraction, by estimation, is 60 to 65%. The  left ventricle has normal function. The left ventricle has no regional  wall motion abnormalities. There is moderate left ventricular hypertrophy.  Left ventricular diastolic  parameters were normal.   2. Right ventricular systolic function is normal. The right ventricular  size is normal. There is normal pulmonary artery systolic pressure.   3. Left atrial size was mildly dilated.   4. Chordal SAM without LVOT gradient. The mitral valve is degenerative.  Mild mitral valve regurgitation. No evidence of mitral stenosis. Severe  mitral annular calcification.   5. The aortic valve is tricuspid. There is moderate calcification of the  aortic valve. There is moderate thickening of the aortic valve. Aortic  valve regurgitation is trivial. Mild aortic valve stenosis.   6. The inferior vena cava is dilated in size with >50% respiratory  variability, suggesting right atrial pressure of 8 mmHg.     Assessment and Plan   1. Afib - no recent symptoms - no signs of recurrent GI bleeding, will restart eliquis 5mg  bid   2. CAD - noted on CT scan, nuclear stress test shows no significant ischemia - no symptoms, cotinue to monitor.    3.HTN -at goal, continue current meds       Arnoldo Lenis, M.D.

## 2021-08-07 ENCOUNTER — Telehealth: Payer: Self-pay | Admitting: Cardiology

## 2021-08-07 MED ORDER — APIXABAN 5 MG PO TABS: 5.0000 mg | ORAL_TABLET | Freq: Two times a day (BID) | ORAL | 0 refills | Status: DC

## 2021-08-07 NOTE — Telephone Encounter (Signed)
Done

## 2021-08-07 NOTE — Telephone Encounter (Signed)
Pt c/o medication issue:  1. Name of Medication: apixaban (ELIQUIS) 5 MG TABS tablet  2. How are you currently taking this medication (dosage and times per day)? Has not started yet  3. Are you having a reaction (difficulty breathing--STAT)? no  4. What is your medication issue? Patient states the new medication was being sent to the [place], but it will take a while for him to get it from them so he would like a 30 day supply sent to [facility] in [place].

## 2021-08-11 ENCOUNTER — Other Ambulatory Visit: Payer: Self-pay | Admitting: *Deleted

## 2021-08-11 MED ORDER — APIXABAN 5 MG PO TABS: 5.0000 mg | ORAL_TABLET | Freq: Two times a day (BID) | ORAL | 5 refills | Status: DC

## 2021-08-11 NOTE — Telephone Encounter (Signed)
Prescription refill request for Eliquis received. Indication: afib  Last office visit: [place], 08/04/2021 Scr: 1.18, 01/16/2021 Age: 75 yo  Weight: 69.9 kg Refill sent.

## 2021-09-08 ENCOUNTER — Ambulatory Visit (HOSPITAL_COMMUNITY): Admission: RE | Admit: 2021-09-08 | Payer: No Typology Code available for payment source | Source: Ambulatory Visit

## 2021-09-08 ENCOUNTER — Encounter (HOSPITAL_COMMUNITY): Payer: Self-pay

## 2021-09-08 ENCOUNTER — Ambulatory Visit (HOSPITAL_COMMUNITY): Payer: No Typology Code available for payment source

## 2021-09-08 ENCOUNTER — Ambulatory Visit (HOSPITAL_COMMUNITY): Payer: No Typology Code available for payment source | Attending: Family Medicine

## 2021-09-23 ENCOUNTER — Other Ambulatory Visit (HOSPITAL_COMMUNITY): Payer: Self-pay | Admitting: Family Medicine

## 2021-09-23 DIAGNOSIS — H532 Diplopia: Secondary | ICD-10-CM

## 2021-09-23 DIAGNOSIS — H509 Unspecified strabismus: Secondary | ICD-10-CM

## 2021-10-09 ENCOUNTER — Ambulatory Visit (HOSPITAL_COMMUNITY)
Admission: RE | Admit: 2021-10-09 | Discharge: 2021-10-09 | Disposition: A | Discharge status: Home / Self Care | Payer: No Typology Code available for payment source | Source: Ambulatory Visit | Attending: Family Medicine | Admitting: Family Medicine

## 2021-10-09 ENCOUNTER — Other Ambulatory Visit: Payer: Self-pay

## 2021-10-09 DIAGNOSIS — R29898 Other symptoms and signs involving the musculoskeletal system: Secondary | ICD-10-CM

## 2021-10-09 DIAGNOSIS — H509 Unspecified strabismus: Secondary | ICD-10-CM | POA: Diagnosis not present

## 2021-10-09 DIAGNOSIS — R531 Weakness: Secondary | ICD-10-CM

## 2021-10-09 DIAGNOSIS — R42 Dizziness and giddiness: Secondary | ICD-10-CM

## 2021-10-09 DIAGNOSIS — H532 Diplopia: Secondary | ICD-10-CM | POA: Diagnosis present

## 2021-10-09 MED ORDER — GADOBUTROL 1 MMOL/ML IV SOLN
7.0000 mL | Freq: Once | INTRAVENOUS | Status: AC | PRN
  Administered 2021-10-09: 7 mL via INTRAVENOUS

## 2021-10-21 DIAGNOSIS — H47292 Other optic atrophy, left eye: Secondary | ICD-10-CM | POA: Diagnosis not present

## 2021-10-21 DIAGNOSIS — H50012 Monocular esotropia, left eye: Secondary | ICD-10-CM | POA: Diagnosis not present

## 2021-11-04 ENCOUNTER — Telehealth: Payer: Self-pay | Admitting: Cardiology

## 2021-11-04 NOTE — Telephone Encounter (Signed)
Pt c/o medication issue:  1. Name of Medication: apixaban (ELIQUIS) 5 MG TABS tablet  2. How are you currently taking this medication (dosage and times per day)? Take 1 tablet (5 mg total) by mouth 2 (two) times daily.  3. Are you having a reaction (difficulty breathing--STAT)? no  4. What is your medication issue? Pt states that med is making him bruise really bad right elbow is black and blue from resting on his arm rest, hands are bruised from putting them in his pockets, stool is black. Patient has an upcoming surgery and would like further advice in regards to this issue... please advise

## 2021-11-04 NOTE — Telephone Encounter (Signed)
Called and spoke with patient.  States he started developing bruising shortly after restarting Eliquis 11/22 but it has progressively gotten worse.  Started his recliner has a padded arm rest and his elbow is black and blue.  Also states his stools are getting black again.  Denies active bleeding.  [facility]  They have not [facility] his call.Based on age,wt,SCr Eliquis 5mg  twice daily is the appropriate dose.  Last labs were 11/22.  Pt has an appt with Nephrology on 11/12/21 with blood work prior.  Encouraged pt to go ahead and gets labs ASAP.  Pt states he will go Friday 11/07/21.  He is comfortable continuing Eliquis until we can review lab results.  Will call pt back after Dr Harl Bowie reviews labs.

## 2021-11-05 NOTE — Telephone Encounter (Signed)
Once labs back would try changing him to xarelto based on calculated creatine clearance, perhaps would be better tolerated  Zandra Abts MD

## 2021-11-10 DIAGNOSIS — H47292 Other optic atrophy, left eye: Secondary | ICD-10-CM | POA: Diagnosis not present

## 2021-11-13 ENCOUNTER — Telehealth: Payer: Self-pay | Admitting: Cardiology

## 2021-11-13 MED ORDER — RIVAROXABAN 20 MG PO TABS: 20.0000 mg | ORAL_TABLET | Freq: Every day | ORAL | 1 refills | Status: DC

## 2021-11-13 NOTE — Telephone Encounter (Signed)
Labs reviewed.  Called and spoke with patient.  He wants to stop Eliquis and start Xarelto to see if his symptoms improve.  Indication: PAF Last office visit: 08/04/21  Zandra Abts MD Weight: 69.9kg Age: 76 Scr: 1.49 on 11/07/21 CrCl: 42.35  Based on above findings Xarelto 20mg  once daily is the appropriate dose.  New Rx sent to [facility] per pt request.

## 2021-11-13 NOTE — Telephone Encounter (Signed)
Patient called and said that he was returning phone call back from this morning

## 2021-11-13 NOTE — Telephone Encounter (Signed)
Called.  See previous note.

## 2021-11-19 ENCOUNTER — Encounter: Payer: Self-pay | Admitting: Internal Medicine

## 2021-12-09 ENCOUNTER — Ambulatory Visit: Payer: No Typology Code available for payment source | Admitting: Cardiology

## 2021-12-09 NOTE — Progress Notes (Deleted)
? ? ? ?Clinical Summary ?Mr. Leopard is a 76 y.o.male seen today for follow up of the following medical problems.  ?  ?1. Afib/PAF ?- previously seen in afib clinic ?- rate control with coreg, eliquis for stroke prevention. ?  ?  ?- recent issues with anemia and heme + stools, has been off eliquis ?-  GI work up with EGD with gastritis, and colonscopy polyps no source of bleeding ?  ?  ?- no recent palpitations ?- has remained off anticoag ?- denies any recent bleeding issues. Hgb over the last 6+ months across tests from different providers has been 15 or high ?  ?- reported bruising on eliquis, we changed to xarelto.  ?  ?2. CAD ?- incidental finding on recent CT PE ?- 04/2016 nuclear stress test no ischemia ?  ?- no rcent symptomes ?  ?  ?3. Esophageal thickening ?- incidental finding on CT scan ?- followed by pcp and GI ?  ?- ongoing dysphagia ?  ?  ?4. COPD ?- emphsymatous changes noted on recent CT PE ?- followed by pcp ?  ?  ?5. Leg pains ?- occurs at rest. Occurs in thighs and calves.  ?- can be worst with walking.  ? 01/2018 ABI right 0.98, left 0.92 ?  ?  ?6. HTN ?- compliant with meds ?  ?  ?7. Hyperlipidemia ?- labs followed by [facility] ?- he is on statin ?  ?8.CKD ?- followed by Dr Theador Hawthorne ?  ?9. Preop evaluation ?- considering knee replacement ?- tolerates above 4 METs without troubles ?  ?  ?  ?10. Valvular heart disease ?- 07/2020 echo LVEF 60-65%, no WMAs, mild MR, very mild AS mean grad 9, AVA VTI 2.64 ?  ?11. Carotid bruits ?- pcp has ordered US ? ? ?Past Medical History:  ?Diagnosis Date  ? Anemia   ? Anxiety   ? OA multiple joints  ? Arthritis   ? Atrial fibrillation ([place])   ? BPH (benign prostatic hyperplasia)   ? COPD (chronic obstructive pulmonary disease) ([place])   ? uses inhaler  ? DDD (degenerative disc disease), lumbar   ? Diabetes mellitus without complication ([place])   ? Fatty liver   ? GERD (gastroesophageal reflux disease)   ? Hyperlipidemia   ? Hypertension   ? ? ? ?No Known  Allergies ? ? ?Current Outpatient Medications  ?Medication Sig Dispense Refill  ? acarbose (PRECOSE) 100 MG tablet Take 100 mg by mouth daily.     ? albuterol (VENTOLIN HFA) 108 (90 Base) MCG/ACT inhaler Inhale into the lungs every 6 (six) hours as needed for wheezing or shortness of breath.    ? carvedilol (COREG) 3.125 MG tablet TAKE 1 TABLET BY MOUTH TWICE DAILY STOPPING  CARDIZEM 180 tablet 3  ? Cholecalciferol 25 MCG (1000 UT) tablet Take 1,000 Units by mouth daily.    ? DULoxetine (CYMBALTA) 60 MG capsule Take 60 mg by mouth daily.    ? finasteride (PROSCAR) 5 MG tablet Take 5 mg by mouth daily.    ? Fluticasone-Salmeterol (ADVAIR) 250-50 MCG/DOSE AEPB Inhale 1 puff into the lungs 2 (two) times daily.    ? gabapentin (NEURONTIN) 300 MG capsule Take 300 mg by mouth 2 (two) times daily.   1  ? HYDROcodone-acetaminophen (NORCO) 7.5-325 MG tablet Take 1 tablet by mouth 4 (four) times daily.    ? losartan (COZAAR) 25 MG tablet Take 2 tablets (50 mg total) by mouth every morning AND 1 tablet (25 mg total)  every evening.    ? metFORMIN (GLUCOPHAGE) 500 MG tablet Take by mouth.    ? pantoprazole (PROTONIX) 40 MG tablet Take 1 tablet by mouth once daily 30 tablet 3  ? rivaroxaban (XARELTO) 20 MG TABS tablet Take 1 tablet (20 mg total) by mouth daily with supper. 90 tablet 1  ? rosuvastatin (CRESTOR) 40 MG tablet Take 40 mg by mouth daily.    ? sildenafil (VIAGRA) 100 MG tablet Take 50 mg by mouth daily as needed for erectile dysfunction. Pt has not started yet.    ? traZODone (DESYREL) 100 MG tablet Take 50 mg by mouth at bedtime.    ? ?No current facility-administered medications for this visit.  ? ? ? ?Past Surgical History:  ?Procedure Laterality Date  ? BACK SURGERY    ? C6-C7  ? BALLOON DILATION N/A 10/01/2020  ? Procedure: BALLOON DILATION;  Surgeon: Eloise Harman, DO;  [facility];  Service: Endoscopy;  Laterality: N/A;  ? BIOPSY  10/01/2020  ? Procedure: BIOPSY;  Surgeon: Eloise Harman, DO;   [facility];  Service: Endoscopy;;  gastric  ? COLONOSCOPY WITH PROPOFOL N/A 10/01/2020  ? Procedure: COLONOSCOPY WITH PROPOFOL;  Surgeon: Eloise Harman, DO;  [facility];  Service: Endoscopy;  Laterality: N/A;  2:15pm, pt know new time per office  ? ESOPHAGOGASTRODUODENOSCOPY (EGD) WITH PROPOFOL N/A 10/01/2020  ? Procedure: ESOPHAGOGASTRODUODENOSCOPY (EGD) WITH PROPOFOL;  Surgeon: Eloise Harman, DO;  [facility];  Service: Endoscopy;  Laterality: N/A;  ? HERNIA REPAIR    ? left inguinal  ? POLYPECTOMY  10/01/2020  ? Procedure: POLYPECTOMY INTESTINAL;  Surgeon: Eloise Harman, DO;  [facility];  Service: Endoscopy;;  ? STRABISMUS SURGERY Left 10/25/2020  ? Procedure: STRABISMUS REPAIR LEFT EYE;  Surgeon: Everitt Amber, MD;  Location: [facility];  Service: Ophthalmology;  Laterality: Left;  ? TONSILLECTOMY    ? ? ? ?No Known Allergies ? ? ? ?Family History  ?Problem Relation Age of Onset  ? Cancer Mother   ?     bladder  ? Diabetes Father   ? Cancer Sister   ?     breast   ? Colon cancer Neg Hx   ? ? ? ?Social History ?Mr. Jentz reports that he has been smoking cigarettes. He started smoking about 61 years ago. He has been smoking an average of 1.5 packs per day. He has never used smokeless tobacco. ?Mr. Pluta reports current alcohol use. ? ? ?Review of Systems ?CONSTITUTIONAL: No weight loss, fever, chills, weakness or fatigue.  ?HEENT: Eyes: No visual loss, blurred vision, double vision or yellow sclerae.No hearing loss, sneezing, congestion, runny nose or sore throat.  ?SKIN: No rash or itching.  ?CARDIOVASCULAR:  ?RESPIRATORY: No shortness of breath, cough or sputum.  ?GASTROINTESTINAL: No anorexia, nausea, vomiting or diarrhea. No abdominal pain or blood.  ?GENITOURINARY: No burning on urination, no polyuria ?NEUROLOGICAL: No headache, dizziness, syncope, paralysis, ataxia, numbness or tingling in the extremities. No change in bowel or  bladder control.  ?MUSCULOSKELETAL: No muscle, back pain, joint pain or stiffness.  ?LYMPHATICS: No enlarged nodes. No history of splenectomy.  ?PSYCHIATRIC: No history of depression or anxiety.  ?ENDOCRINOLOGIC: No reports of sweating, cold or heat intolerance. No polyuria or polydipsia.  ?. ? ? ?Physical Examination ?There were no vitals filed for this visit. ?There were no vitals filed for this visit. ? ?Gen: resting comfortably, no acute distress ?HEENT: no  scleral icterus, pupils equal round and reactive, no palptable cervical adenopathy,  ?CV ?Resp: Clear to auscultation bilaterally ?GI: abdomen is soft, non-tender, non-distended, normal bowel sounds, no hepatosplenomegaly ?MSK: extremities are warm, no edema.  ?Skin: warm, no rash ?Neuro:  no focal deficits ?Psych: appropriate affect ? ? ?Diagnostic Studies ? ?04/2016 echo ?Study Conclusions ?  ?- Left ventricle: Turbulent flow through LVOT. Systolic anterior ?  motio of MV leaflets Peak gradient through LV/LVOT /AV is 15 mm ?  Hg at rest No signifiant obstruction at rest. The cavity size was ?  normal. Wall thickness was increased in a pattern of mild LVH. ?  Systolic function was vigorous. The estimated ejection fraction ?  was in the range of 65% to 70%. Doppler parameters are consistent ?  with abnormal left ventricular relaxation (grade 1 diastolic ?  dysfunction). ?- Aortic valve: AV is thickened, calcified with minimally ?  restricted motion. ?- Mitral valve: SAM of mitral valve leaflets. Calcified annulus. ?  Mildly thickened leaflets . ?- Left atrium: The atrium was mildly dilated. ?- Pulmonary arteries: PA peak pressure: 32 mm Hg (S). ?  ?04/2016 CT PE ?IMPRESSION: ?1. No evidence of acute pulmonary embolism. ?2. Mild cardiomegaly. Left ventricular hypertrophy. Mild aortic ?valvular calcification. ?3. Moderate three-vessel coronary atherosclerosis and mild aortic ?atherosclerosis. ?4. Mild circumferential wall thickening involving the upper  thoracic ?esophagus and of the distal esophagus. Query esophagitis as no ?distinct mass is identified. Upper endoscopy may be confirmatory. ?5. COPD/emphysema.  No acute cardiopulmonary disease. ?  ?  ?  ?04/2016 Nuclear stress ?Terie Purser

## 2021-12-10 DIAGNOSIS — H50012 Monocular esotropia, left eye: Secondary | ICD-10-CM | POA: Diagnosis not present

## 2021-12-10 DIAGNOSIS — Z9889 Other specified postprocedural states: Secondary | ICD-10-CM | POA: Diagnosis not present

## 2021-12-10 DIAGNOSIS — H47292 Other optic atrophy, left eye: Secondary | ICD-10-CM | POA: Diagnosis not present

## 2021-12-10 DIAGNOSIS — Z961 Presence of intraocular lens: Secondary | ICD-10-CM | POA: Diagnosis not present

## 2021-12-10 DIAGNOSIS — H532 Diplopia: Secondary | ICD-10-CM | POA: Diagnosis not present

## 2021-12-23 ENCOUNTER — Encounter: Payer: Self-pay | Admitting: Internal Medicine

## 2022-01-21 DIAGNOSIS — D508 Other iron deficiency anemias: Secondary | ICD-10-CM | POA: Diagnosis not present

## 2022-01-28 DIAGNOSIS — D508 Other iron deficiency anemias: Secondary | ICD-10-CM | POA: Diagnosis not present

## 2022-02-05 ENCOUNTER — Telehealth: Payer: Self-pay | Admitting: Cardiology

## 2022-02-05 NOTE — Telephone Encounter (Signed)
Renold Don at [facility] that patient reported to our office he takes losartan 50 mg in the morning and 25 mg in the evening. Advised that nephrology office (Dr. Theador Hawthorne) increased to this dose on 08/01/2021 per nurse at his office. Advised to contact nephrology office for office note that this reflect change. Verbalized understanding.

## 2022-02-05 NOTE — Telephone Encounter (Signed)
  Calling because they need Dr. Notes that states the dosage that Dr. Harl Bowie wanted the patient to take for losartan (COZAAR) 25 MG tablet. Fax number [phone] Please advise

## 2022-02-10 DIAGNOSIS — H5089 Other specified strabismus: Secondary | ICD-10-CM | POA: Diagnosis not present

## 2022-02-10 DIAGNOSIS — H532 Diplopia: Secondary | ICD-10-CM | POA: Diagnosis not present

## 2022-03-02 ENCOUNTER — Ambulatory Visit: Payer: No Typology Code available for payment source | Admitting: Gastroenterology

## 2022-03-11 ENCOUNTER — Telehealth: Payer: Self-pay | Admitting: Orthopedic Surgery

## 2022-03-11 NOTE — Telephone Encounter (Signed)
Patient asked if his [place] referral from 2022 was still in effect; relayed it has expired; therefore, he will again contact his primary care at [facility] clinic to request a new referral. Patient relays he wants to discuss scheduling knee replacement surgery.

## 2022-03-15 NOTE — Progress Notes (Deleted)
Referring Provider: Anthoney Harada, NP Primary Care Physician:  Anthoney Harada, NP Primary GI Physician: Dr. Abbey Chatters  No chief complaint on file.   HPI:   Nicolas Berry is a 76 y.o. male presenting today with a history of atrial fibrillation, HTN, diabetes, COPD, IDA, GERD, dysphagia, constipation, presenting today at the request of [facility] for melena.  Last seen in our office 09/11/2020 at the request of the [place] for further evaluation of blood in stools.  Patient reported having very dark stools for about 1 month, stopped Eliquis last month at the request of cardiology, and stools cleared up.  Constipation was doing well with laxative powder (likely MiraLAX) he was recently prescribed iron by the [place], but had not started it yet.  Reported history of reflux years ago, but had done well until recently.  Reported several episodes of waking up at night with nausea and burning in chest.  Also vague dysphagia.  Also reported progressive shortness of breath/DOE over the past 2 months.  His Hemoglobin was 8.5 in December 2021 at the [facility]. plan included starting pantoprazole 40 mg daily, avoid NSAIDs, EGD and colonoscopy, start iron as recommended by the [place], continue MiraLAX.  Procedures completed 10/01/2020: Colonoscopy: Nonbleeding internal hemorrhoids, multiple small large mouth diverticula in the sigmoid and descending colon, 5 mm polyp in the ascending colon resected but not retrieved, 8 mm polyp and 5 mm in the transverse colon resected and retrieved.  Recommended 3-year surveillance considering borderline prep.  Pathology with 2 tubular adenomas. EGD: Small hiatal hernia, benign-appearing esophageal stenosis dilated, gastritis biopsied, normal examined duodenum.  Pathology with chronic active gastritis with intestinal metaplasia, positive for H. pylori.  Recommended treatment with Prevpac or generic equivalent  Reviewed most recent labs in care everywhere.  Hemoglobin was up to 15  in November 2022, 13.6 in February 2023, down to 11.3 in April 2023 with an elevated MCV of 100.6.  His iron panel has also been declining, previously within normal limits in July 2022, April 2023 with ferritin 18 (L), saturation 16% (L), total iron 64.  Appears he received 2 iron infusions in May 2023.  Today:   H. pylori:   Past Medical History:  Diagnosis Date   Anemia    Anxiety    OA multiple joints   Arthritis    Atrial fibrillation (H[facility])    BPH (benign prostatic hyperplasia)    COPD (chronic obstructive pulmonary disease) (H[facility])    uses inhaler   DDD (degenerative disc disease), lumbar    Diabetes mellitus without complication ([place])    Fatty liver    GERD (gastroesophageal reflux disease)    Hyperlipidemia    Hypertension     Past Surgical History:  Procedure Laterality Date   BACK SURGERY     C6-C7   BALLOON DILATION N/A 10/01/2020   Procedure: BALLOON DILATION;  Surgeon: Eloise Harman, DO;  [facility];  Service: Endoscopy;  Laterality: N/A;   BIOPSY  10/01/2020   Procedure: BIOPSY;  Surgeon: Eloise Harman, DO;  [facility];  Service: Endoscopy;;  gastric   COLONOSCOPY WITH PROPOFOL N/A 10/01/2020   Procedure: COLONOSCOPY WITH PROPOFOL;  Surgeon: Eloise Harman, DO;  [facility];  Service: Endoscopy;  Laterality: N/A;  2:15pm, pt know new time per office   ESOPHAGOGASTRODUODENOSCOPY (EGD) WITH PROPOFOL N/A 10/01/2020   Procedure: ESOPHAGOGASTRODUODENOSCOPY (EGD) WITH PROPOFOL;  Surgeon: Eloise Harman, DO;  [facility];  Service: Endoscopy;  Laterality: N/A;   HERNIA REPAIR     left inguinal   POLYPECTOMY  10/01/2020   Procedure: POLYPECTOMY INTESTINAL;  Surgeon: Eloise Harman, DO;  [facility];  Service: Endoscopy;;   STRABISMUS SURGERY Left 10/25/2020   Procedure: STRABISMUS REPAIR LEFT EYE;  Surgeon: Everitt Amber, MD;  Location: [facility];  Service: Ophthalmology;   Laterality: Left;   TONSILLECTOMY      Current Outpatient Medications  Medication Sig Dispense Refill   acarbose (PRECOSE) 100 MG tablet Take 100 mg by mouth daily.      albuterol (VENTOLIN HFA) 108 (90 Base) MCG/ACT inhaler Inhale into the lungs every 6 (six) hours as needed for wheezing or shortness of breath.     carvedilol (COREG) 3.125 MG tablet TAKE 1 TABLET BY MOUTH TWICE DAILY STOPPING  CARDIZEM 180 tablet 3   Cholecalciferol 25 MCG (1000 UT) tablet Take 1,000 Units by mouth daily.     DULoxetine (CYMBALTA) 60 MG capsule Take 60 mg by mouth daily.     finasteride (PROSCAR) 5 MG tablet Take 5 mg by mouth daily.     Fluticasone-Salmeterol (ADVAIR) 250-50 MCG/DOSE AEPB Inhale 1 puff into the lungs 2 (two) times daily.     gabapentin (NEURONTIN) 300 MG capsule Take 300 mg by mouth 2 (two) times daily.   1   HYDROcodone-acetaminophen (NORCO) 7.5-325 MG tablet Take 1 tablet by mouth 4 (four) times daily.     losartan (COZAAR) 25 MG tablet Take 2 tablets (50 mg total) by mouth every morning AND 1 tablet (25 mg total) every evening.     metFORMIN (GLUCOPHAGE) 500 MG tablet Take by mouth.     pantoprazole (PROTONIX) 40 MG tablet Take 1 tablet by mouth once daily 30 tablet 3   rivaroxaban (XARELTO) 20 MG TABS tablet Take 1 tablet (20 mg total) by mouth daily with supper. 90 tablet 1   rosuvastatin (CRESTOR) 40 MG tablet Take 40 mg by mouth daily.     sildenafil (VIAGRA) 100 MG tablet Take 50 mg by mouth daily as needed for erectile dysfunction. Pt has not started yet.     traZODone (DESYREL) 100 MG tablet Take 50 mg by mouth at bedtime.     No current facility-administered medications for this visit.    Allergies as of 03/16/2022   (No Known Allergies)    Family History  Problem Relation Age of Onset   Cancer Mother        bladder   Diabetes Father    Cancer Sister        breast    Colon cancer Neg Hx     Social History   Socioeconomic History   Marital status: Married     Spouse name: Not on file   Number of children: 3   Years of education: Not on file   Highest education level: Not on file  Occupational History   Occupation: retired  Tobacco Use   Smoking status: Every Day    Packs/day: 1.50    Types: Cigarettes    Start date: 08/06/1960   Smokeless tobacco: Never  Vaping Use   Vaping Use: Some days  Substance and Sexual Activity   Alcohol use: Yes    Comment: rarely   Drug use: Never   Sexual activity: Not on file  Other Topics Concern   Not on file  Social History Narrative   Not on file   Social Determinants of Health   Financial  Resource Strain: Not on file  Food Insecurity: Not on file  Transportation Needs: Not on file  Physical Activity: Not on file  Stress: Not on file  Social Connections: Not on file    Review of Systems: Gen: Denies fever, chills, cold or flulike symptoms, presyncope, syncope.   CV: Denies chest pain, palpitations. Resp: Denies dyspnea, cough. GI: See HPI Heme: See HPI  Physical Exam: There were no vitals taken for this visit. General:   Alert and oriented. No distress noted. Pleasant and cooperative.  Head:  Normocephalic and atraumatic. Eyes:  Conjuctiva clear without scleral icterus. Heart:  S1, S2 present without murmurs appreciated. Lungs:  Clear to auscultation bilaterally. No wheezes, rales, or rhonchi. No distress.  Abdomen:  +BS, soft, non-tender and non-distended. No rebound or guarding. No HSM or masses noted. Msk:  Symmetrical without gross deformities. Normal posture. Extremities:  Without edema. Neurologic:  Alert and  oriented x4 Psych:  Normal mood and affect.    Assessment:     Plan:  ***   Aliene Altes, PA-C [facility] 03/16/2022

## 2022-03-16 ENCOUNTER — Ambulatory Visit: Payer: No Typology Code available for payment source | Admitting: Gastroenterology

## 2022-03-25 ENCOUNTER — Telehealth: Payer: Self-pay | Admitting: Cardiology

## 2022-03-25 NOTE — Telephone Encounter (Signed)
Pt is wanting to start taking Eliquis again because the Xarelto is causing him to bruise really bad.   Please call [phone]

## 2022-03-26 NOTE — Telephone Encounter (Signed)
Can stop xarelto and restart eliquis '5mg'$  bid.   Zandra Abts MD

## 2022-03-27 MED ORDER — APIXABAN 5 MG PO TABS: 5.0000 mg | ORAL_TABLET | Freq: Two times a day (BID) | ORAL | 1 refills | Status: DC

## 2022-03-27 NOTE — Telephone Encounter (Signed)
Patient made aware. Xarelto has been removed from med list. [facility] per patient request.

## 2022-04-13 ENCOUNTER — Encounter: Payer: Self-pay | Admitting: Orthopedic Surgery

## 2022-04-13 ENCOUNTER — Ambulatory Visit (INDEPENDENT_AMBULATORY_CARE_PROVIDER_SITE_OTHER): Payer: No Typology Code available for payment source | Admitting: Orthopedic Surgery

## 2022-04-13 DIAGNOSIS — M1711 Unilateral primary osteoarthritis, right knee: Secondary | ICD-10-CM

## 2022-04-13 DIAGNOSIS — F17210 Nicotine dependence, cigarettes, uncomplicated: Secondary | ICD-10-CM

## 2022-04-13 DIAGNOSIS — M1710 Unilateral primary osteoarthritis, unspecified knee: Secondary | ICD-10-CM

## 2022-04-13 NOTE — Patient Instructions (Signed)
Dr. Aline Brochure will follow up with you by the end of the week after gathering more information to discuss surgery.

## 2022-04-13 NOTE — Progress Notes (Signed)
Chief Complaint  Patient presents with   Follow-up    Recheck on right knee.   [name] comes in he wants to have his knee done  He has osteoarthritis of his knee he is on Eliquis for A-fib.  He has some kidney disease recently seen by nephrology with improved renal studies.  He also smokes a pack per day has not been able to quit.  He is diabetic we do not know his hemoglobin A1c  He has several barriers to successful knee surgery which I reviewed with him today  I will call the [place] call his kidney doctor and see if we can get the lab studies that we need to  I asked him to take 1 cigarette out of his pack of cigarettes daily for the next 10 days to get down to half a pack per day  He may need to be sent to [facility] for surgery as he has multiple risks

## 2022-04-15 ENCOUNTER — Telehealth: Payer: Self-pay | Admitting: Orthopedic Surgery

## 2022-04-15 NOTE — Telephone Encounter (Signed)
Patient called to follow up after his visit with Dr Aline Brochure on Monday, 04/13/22, regarding 'Dr Aline Brochure checking on some things' - in reference to patient being able to have surgery.

## 2022-04-16 ENCOUNTER — Telehealth: Payer: Self-pay

## 2022-04-16 NOTE — Telephone Encounter (Signed)
Patient left message on voicemail stating he was returning Dr. Ruthe Mannan call   [phone]

## 2022-04-17 ENCOUNTER — Telehealth: Payer: Self-pay | Admitting: Radiology

## 2022-04-17 ENCOUNTER — Telehealth (INDEPENDENT_AMBULATORY_CARE_PROVIDER_SITE_OTHER): Payer: No Typology Code available for payment source | Admitting: Orthopedic Surgery

## 2022-04-17 DIAGNOSIS — D509 Iron deficiency anemia, unspecified: Secondary | ICD-10-CM

## 2022-04-17 NOTE — Telephone Encounter (Signed)
-----   Message from Carole Civil, MD sent at 04/17/2022  9:03 AM EDT ----- Can you schedule Mr. Magdalene Patricia for 2 consultations  1.  Consultation with one of the GI doctors in [place] for anemia  Consultation #2 with Dr. Ninfa Linden for total knee

## 2022-04-17 NOTE — Telephone Encounter (Signed)
I spoke to Nicolas Berry on the phone today to discuss his desire to have knee replacement surgery  He has several medical problems that are concerning  He has grade 3A kidney disease with proteinuria he just started a new medication to try to address that he also has iron deficiency anemia which required iron infusions x2 he was advised to have a GI consult to work-up his anemia which she has not done  His parathyroid hormone and vitamin D levels are off mainly due to his kidney disease  His congestive heart failure is stable with an echo of 65% he has a cardiology appointment set up for August 25  In his records it notes that he has thrombocytopenia but his last platelet count was 161,000 his hemoglobin A1c is good at 5.9 indicating his diabetic management is good  He is still smoking though  He is on Eliquis and with his hypertension history of atrial fibrillation he is at risk for stroke once he comes off the Eliquis  All that said I advised him to have his surgery done at [facility].  This would require referral to Dr. Ninfa Linden in [place] care  He is agreeable  Barriers to surgery at this point  1.  GI consult  2.  Smoking  3.  Upcoming cardiology appointment  4.  Stopping Eliquis prior to surgery with increased risk of stroke

## 2022-04-17 NOTE — Telephone Encounter (Signed)
Yes, have put in referrals, will ensure these are done.

## 2022-05-05 ENCOUNTER — Telehealth: Payer: Self-pay | Admitting: Orthopedic Surgery

## 2022-05-05 NOTE — Telephone Encounter (Signed)
Call (voice message) from [employer] received from [place], ph# [phone], ext [phone], following up on records request; said making sure we received notes related to nephrology. States patient also sees a local nephrology specialist, [facility], ph# [phone] / # [phone], if anything further is needed.

## 2022-05-06 ENCOUNTER — Encounter: Payer: Self-pay | Admitting: Orthopaedic Surgery

## 2022-05-06 ENCOUNTER — Ambulatory Visit (INDEPENDENT_AMBULATORY_CARE_PROVIDER_SITE_OTHER): Payer: No Typology Code available for payment source

## 2022-05-06 ENCOUNTER — Ambulatory Visit (INDEPENDENT_AMBULATORY_CARE_PROVIDER_SITE_OTHER): Payer: No Typology Code available for payment source | Admitting: Orthopaedic Surgery

## 2022-05-06 VITALS — Ht 70.0 in | Wt 149.0 lb

## 2022-05-06 DIAGNOSIS — M25561 Pain in right knee: Secondary | ICD-10-CM | POA: Diagnosis not present

## 2022-05-06 DIAGNOSIS — G8929 Other chronic pain: Secondary | ICD-10-CM

## 2022-05-06 DIAGNOSIS — M1711 Unilateral primary osteoarthritis, right knee: Secondary | ICD-10-CM | POA: Diagnosis not present

## 2022-05-06 NOTE — Progress Notes (Signed)
The patient is a very pleasant 76 year old graciously sent to me from Dr. Arther Abbott at [facility] to evaluate and treat severe right knee osteoarthritis.  Dr. Aline Brochure appropriately sent him our way since the patient does have significant comorbidities including diabetes, chronic kidney disease and atrial fibrillation.  He is on Eliquis.  Known for his right knee for many years now.  He says his pain is worsening and the knee stays swollen.  He reports that his blood glucose is usually under good control and he has had normal hemoglobin A1c's in the past.  He is a chronic smoker as well.  Apparently he does have a cardiology appointment coming up this month as well as an appointment with an eye specialist at the end of the month.  He is interested in knee replacement surgery for his right knee.  He currently denies any headache, chest pain, shortness of breath, fever, chills, nausea, vomiting.  He has a thin individual.  Examination of his right knee does show a moderate effusion.  There is significant medial joint line tenderness throughout the arc of motion of his knee with significant patellofemoral grinding.  The knee feels limply stable but there is definitely varus malalignment that is correctable.  His left knee is nice and straight and normal appearing.  He does walk with a slight limp as well.  X-rays of the right knee show tricompartment arthritis with varus malalignment.  There is osteophytes in all 3 compartments as well as bone-on-bone wear medially.  There is also sclerotic changes.  I went over his x-rays with him and went over knee replacement model.  I explained in detail what the surgery involves and discussed the risk and benefits of knee replacement surgery.  He does need cardiac clearance for the surgery and guidance in terms of how to proceed with his anticoagulation and blood thinning medications when he comes off Eliquis.  The question is whether or not he needs to be  bridged with Lovenox.  Usually from a standpoint of anesthesia and the safety with spinal anesthesia, the patient needs to be off of Eliquis for 3 full days.  I did give him my card to give his cardiologist so we can have some guidance of whether or not we can schedule surgery and then how do deal with the aspects of anticoagulation.  I did talk about what to expect from a postoperative and interoperative talk about what to expect from a postoperative and interoperative standpoint of surgery.  All questions and concerns were answered and addressed.  We will be in touch with scheduling surgery once we have that cardiac clearance.  I appreciate Dr. Ruthe Mannan sending me this very nice patient.

## 2022-05-11 NOTE — Telephone Encounter (Signed)
We also have received nephrology notes via fax from [place], indicating that patient is needing surgery on his knee. Please advise

## 2022-05-14 ENCOUNTER — Telehealth: Payer: Self-pay | Admitting: Cardiology

## 2022-05-14 NOTE — Telephone Encounter (Signed)
Patient with diagnosis of A Fib on Eliquis for anticoagulation.    Procedure:   RT TOTAL KNEE ARTHROPLASTY Date of procedure: TBD   CHA2DS2-VASc Score = 4  This indicates a 4.8% annual risk of stroke. The patient's score is based upon: CHF History: 0 HTN History: 1 Diabetes History: 1 Stroke History: 0 Vascular Disease History: 0 Age Score: 2 Gender Score: 0   CrCl 73m/min Platelet count 161K  Per office protocol, patient can hold Eliquis for 3 days prior to procedure.    Patient will not need bridging with Lovenox (enoxaparin) around procedure.  **This guidance is not considered finalized until pre-operative APP has relayed final recommendations.**

## 2022-05-14 NOTE — Telephone Encounter (Signed)
   Pre-operative Risk Assessment    Patient Name: Nicolas Berry  DOB: November 17, 1945 MRN: [id number]      Request for Surgical Clearance    Procedure:   RT TOTAL KNEE ARTHROPLASTY  Date of Surgery:  Clearance TBD                                 Surgeon:  Jean Rosenthal Surgeon's Group or Practice Name:  [facility] Phone number:  [phone] Fax number:  [phone] [place]    Type of Clearance Requested:   - Pharmacy:  Hold    NEED BLOOD THINNER INSTRUCTIONS (? BRIDGE W/ LOVENOX)   Type of Anesthesia:  General VS SPINAL + BLOCK   Additional requests/questions:  Please advise surgeon/provider what medications should be held.  Signed, Desma Paganini   05/14/2022, 2:51 PM

## 2022-05-15 ENCOUNTER — Ambulatory Visit (INDEPENDENT_AMBULATORY_CARE_PROVIDER_SITE_OTHER): Payer: No Typology Code available for payment source | Admitting: Cardiology

## 2022-05-15 ENCOUNTER — Encounter: Payer: Self-pay | Admitting: Cardiology

## 2022-05-15 VITALS — BP 120/74 | HR 64 | Ht 70.5 in | Wt 146.8 lb

## 2022-05-15 DIAGNOSIS — I2510 Atherosclerotic heart disease of native coronary artery without angina pectoris: Secondary | ICD-10-CM | POA: Diagnosis not present

## 2022-05-15 DIAGNOSIS — Z01810 Encounter for preprocedural cardiovascular examination: Secondary | ICD-10-CM

## 2022-05-15 DIAGNOSIS — I10 Essential (primary) hypertension: Secondary | ICD-10-CM | POA: Diagnosis not present

## 2022-05-15 DIAGNOSIS — I480 Paroxysmal atrial fibrillation: Secondary | ICD-10-CM

## 2022-05-15 NOTE — Progress Notes (Signed)
Clinical Summary [name] is a 76 y.o.male seen today for follow up of the following medical problems.    1. Afib/PAF - previously seen in afib clinic - rate control with coreg, eliquis for stroke prevention.     - recent issues with anemia and heme + stools, has been off eliquis -  GI work up with EGD with gastritis, and colonscopy polyps no source of bleeding     - no recent palpitations - has remained off anticoag - denies any recent bleeding issues. Hgb over the last 6+ months across tests from different providers has been 15 or high   -bruising on eliquis, tried on xarelto and di worst. Back on eliquis, tolerating the bruising. No recent blood in stools.  - no recent palpitations    2. CAD - incidental finding on recent CT PE - 04/2016 nuclear stress test no ischemia   - no recent chest pains     3. Esophageal thickening - incidental finding on CT scan - followed by pcp and GI   - ongoing dysphagia     4. COPD - emphsymatous changes noted on recent CT PE - followed by pcp     5. Leg pains - occurs at rest. Occurs in thighs and calves.  - can be worst with walking.   01/2018 ABI right 0.98, left 0.92     6. HTN - compliant with meds     7. Hyperlipidemia - labs followed by [place] - he is on statin   8.CKD - followed by Dr Nicolas Berry   9. Preop evaluation - considering knee replacement - tolerates above 4 METs without troubles - uses push mower 15-20 minutes without symptoms. [place], can be up inclines.        10. Valvular heart disease - 07/2020 echo LVEF 60-65%, no WMAs, mild MR, very mild AS mean grad 9, AVA VTI 2.64   11. Carotid bruits - Jan 2023 no significant stenosis Past Medical History:  Diagnosis Date   Anemia    Anxiety    OA multiple joints   Arthritis    Atrial fibrillation (HCC)    BPH (benign prostatic hyperplasia)    COPD (chronic obstructive pulmonary disease) (HCC)    uses  inhaler   DDD (degenerative disc disease), lumbar    Diabetes mellitus without complication (HCC)    Fatty liver    GERD (gastroesophageal reflux disease)    Hyperlipidemia    Hypertension      No Known Allergies   Current Outpatient Medications  Medication Sig Dispense Refill   acarbose (PRECOSE) 100 MG tablet Take 100 mg by mouth daily.      albuterol (VENTOLIN HFA) 108 (90 Base) MCG/ACT inhaler Inhale into the lungs every 6 (six) hours as needed for wheezing or shortness of breath.     apixaban (ELIQUIS) 5 MG TABS tablet Take 1 tablet (5 mg total) by mouth 2 (two) times daily. 180 tablet 1   carvedilol (COREG) 3.125 MG tablet TAKE 1 TABLET BY MOUTH TWICE DAILY STOPPING  CARDIZEM 180 tablet 3   Cholecalciferol 25 MCG (1000 UT) tablet Take 1,000 Units by mouth daily.     DULoxetine (CYMBALTA) 60 MG capsule Take 60 mg by mouth daily.     finasteride (PROSCAR) 5 MG tablet Take 5 mg by mouth daily.     Fluticasone-Salmeterol (ADVAIR) 250-50 MCG/DOSE AEPB Inhale 1 puff into the lungs 2 (two) times daily.  gabapentin (NEURONTIN) 300 MG capsule Take 300 mg by mouth 2 (two) times daily.   1   HYDROcodone-acetaminophen (NORCO) 7.5-325 MG tablet Take 1 tablet by mouth 4 (four) times daily.     losartan (COZAAR) 25 MG tablet Take 2 tablets (50 mg total) by mouth every morning AND 1 tablet (25 mg total) every evening.     metFORMIN (GLUCOPHAGE) 500 MG tablet Take by mouth.     pantoprazole (PROTONIX) 40 MG tablet Take 1 tablet by mouth once daily 30 tablet 3   rosuvastatin (CRESTOR) 40 MG tablet Take 40 mg by mouth daily.     sildenafil (VIAGRA) 100 MG tablet Take 50 mg by mouth daily as needed for erectile dysfunction. Pt has not started yet.     traZODone (DESYREL) 100 MG tablet Take 50 mg by mouth at bedtime.     No current facility-administered medications for this visit.     Past Surgical History:  Procedure Laterality Date   BACK SURGERY     C6-C7   BALLOON DILATION N/A  10/01/2020   Procedure: BALLOON DILATION;  Surgeon: Nicolas Harman, DO;  [facility];  Service: Endoscopy;  Laterality: N/A;   BIOPSY  10/01/2020   Procedure: BIOPSY;  Surgeon: Nicolas Harman, DO;  [facility];  Service: Endoscopy;;  gastric   COLONOSCOPY WITH PROPOFOL N/A 10/01/2020   Procedure: COLONOSCOPY WITH PROPOFOL;  Surgeon: Nicolas Harman, DO;  [facility];  Service: Endoscopy;  Laterality: N/A;  2:15pm, pt know new time per office   ESOPHAGOGASTRODUODENOSCOPY (EGD) WITH PROPOFOL N/A 10/01/2020   Procedure: ESOPHAGOGASTRODUODENOSCOPY (EGD) WITH PROPOFOL;  Surgeon: Nicolas Harman, DO;  [facility];  Service: Endoscopy;  Laterality: N/A;   HERNIA REPAIR     left inguinal   POLYPECTOMY  10/01/2020   Procedure: POLYPECTOMY INTESTINAL;  Surgeon: Nicolas Harman, DO;  [facility];  Service: Endoscopy;;   STRABISMUS SURGERY Left 10/25/2020   Procedure: STRABISMUS REPAIR LEFT EYE;  Surgeon: Nicolas Amber, MD;  Location: [place];  Service: Ophthalmology;  Laterality: Left;   TONSILLECTOMY       No Known Allergies    Family History  Problem Relation Age of Onset   Cancer Mother        bladder   Diabetes Father    Cancer Sister        breast    Colon cancer Neg Hx      Social History Nicolas Berry reports that he has been smoking cigarettes. He started smoking about 61 years ago. He has been smoking an average of 1.5 packs per day. He has never used smokeless tobacco. Nicolas Berry reports current alcohol use.   Review of Systems CONSTITUTIONAL: No weight loss, fever, chills, weakness or fatigue.  HEENT: Eyes: No visual loss, blurred vision, double vision or yellow sclerae.No hearing loss, sneezing, congestion, runny nose or sore throat.  SKIN: No rash or itching.  CARDIOVASCULAR: per hpi RESPIRATORY: No shortness of breath, cough or sputum.  GASTROINTESTINAL: No anorexia, nausea, vomiting or  diarrhea. No abdominal pain or blood.  GENITOURINARY: No burning on urination, no polyuria NEUROLOGICAL: No headache, dizziness, syncope, paralysis, ataxia, numbness or tingling in the extremities. No change in bowel or bladder control.  MUSCULOSKELETAL: No muscle, back pain, joint pain or stiffness.  LYMPHATICS: No enlarged nodes. No history of splenectomy.  PSYCHIATRIC: No history of depression or anxiety.  ENDOCRINOLOGIC: No reports of sweating, cold or  heat intolerance. No polyuria or polydipsia.  Marland Kitchen   Physical Examination Today's Vitals   05/15/22 0811  BP: 120/74  Pulse: 64  SpO2: 100%  Weight: 146 lb 12.8 oz (66.6 kg)  Height: 5' 10.5" (1.791 m)   Body mass index is 20.77 kg/m.  Gen: resting comfortably, no acute distress HEENT: no scleral icterus, pupils equal round and reactive, no palptable cervical adenopathy,  CV: RRR, no m/r/g, no jvd Resp: Clear to auscultation bilaterally GI: abdomen is soft, non-tender, non-distended, normal bowel sounds, no hepatosplenomegaly MSK: extremities are warm, no edema.  Skin: warm, no rash Neuro:  no focal deficits Psych: appropriate affect   Diagnostic Studies  04/2016 echo Study Conclusions   - Left ventricle: Turbulent flow through LVOT. Systolic anterior   motio of MV leaflets Peak gradient through LV/LVOT /AV is 15 mm   Hg at rest No signifiant obstruction at rest. The cavity size was   normal. Wall thickness was increased in a pattern of mild LVH.   Systolic function was vigorous. The estimated ejection fraction   was in the range of 65% to 70%. Doppler parameters are consistent   with abnormal left ventricular relaxation (grade 1 diastolic   dysfunction). - Aortic valve: AV is thickened, calcified with minimally   restricted motion. - Mitral valve: SAM of mitral valve leaflets. Calcified annulus.   Mildly thickened leaflets . - Left atrium: The atrium was mildly dilated. - Pulmonary arteries: PA peak pressure: 32 mm  Hg (S).   04/2016 CT PE IMPRESSION: 1. No evidence of acute pulmonary embolism. 2. Mild cardiomegaly. Left ventricular hypertrophy. Mild aortic valvular calcification. 3. Moderate three-vessel coronary atherosclerosis and mild aortic atherosclerosis. 4. Mild circumferential wall thickening involving the upper thoracic esophagus and of the distal esophagus. Query esophagitis as no distinct mass is identified. Upper endoscopy may be confirmatory. 5. COPD/emphysema.  No acute cardiopulmonary disease.       04/2016 Nuclear stress There was no ST segment deviation noted during stress. No T wave inversion was noted during stress. The study is normal. This is a low risk study. The left ventricular ejection fraction is hyperdynamic (>65%).     01/2018 ABIs FINDINGS: Right ABI:  0.98   Left ABI:  0.92   Right Lower Extremity:  Normal arterial waveforms at the ankle.   Left Lower Extremity:  Normal arterial waveforms at the ankle.   IMPRESSION: 1. Borderline normal bilateral ABIs. Consider postexercise testing to exclude occult arterial occlusive disease.     07/2020 echo IMPRESSIONS     1. Left ventricular ejection fraction, by estimation, is 60 to 65%. The  left ventricle has normal function. The left ventricle has no regional  wall motion abnormalities. There is moderate left ventricular hypertrophy.  Left ventricular diastolic  parameters were normal.   2. Right ventricular systolic function is normal. The right ventricular  size is normal. There is normal pulmonary artery systolic pressure.   3. Left atrial size was mildly dilated.   4. Chordal SAM without LVOT gradient. The mitral valve is degenerative.  Mild mitral valve regurgitation. No evidence of mitral stenosis. Severe  mitral annular calcification.   5. The aortic valve is tricuspid. There is moderate calcification of the  aortic valve. There is moderate thickening of the aortic valve. Aortic  valve  regurgitation is trivial. Mild aortic valve stenosis.   6. The inferior vena cava is dilated in size with >50% respiratory  variability, suggesting right atrial pressure of 8 mmHg.  Assessment and Plan  1. PAF - no symptoms. EKG today shows sinus brady at 56 - chronic bruising on eliquis, prior GI bleed had to be held but no current issues.  - he is interested in [facility] device, we will refer him to clinic for evaluation.    2. CAD - noted on CT scan, nuclear stress test shows no significant ischemia - no symptoms, continue to monitor   3.HTN -he is at goal, continue current meds  4. Preoperative evaluation - no active acute cardiac conditions. Tolerates greater than 4 METs regularly without exertional symptoms - ok to proceed with knee surgery from cardiac standpoint. Can hold eliquis 2 days prior, resume day after.    Carlyle Dolly MD

## 2022-05-15 NOTE — Telephone Encounter (Signed)
In box - per Abigail Butts, notes are for Dr Ninfa Linden.

## 2022-05-15 NOTE — Telephone Encounter (Signed)
   Name: Nicolas Berry  DOB: [birth year]  MRN: [id number]   Primary Cardiologist: Carlyle Dolly, MD  Chart reviewed as part of pre-operative protocol coverage.   Per our pharmacy team and office protocol the patient can hold Eliquis x3 days prior to the procedure.  Please restart when medically safe to do so.  The patient will not need bridging with Lovenox.  Medical clearance not requested  I will route this recommendation to the requesting party via Epic fax function and remove from pre-op pool. Please call with questions.  Elgie Collard, PA-C 05/15/2022, 8:48 AM

## 2022-05-15 NOTE — Patient Instructions (Addendum)
Medication Instructions:  Continue all current medications.  Labwork: none  Testing/Procedures: none  Follow-Up: 6 months   Any Other Special Instructions Will Be Listed Below (If Applicable). You have been referred to [facility]    If you need a refill on your cardiac medications before your next appointment, please call your pharmacy.

## 2022-05-19 ENCOUNTER — Encounter: Payer: Self-pay | Admitting: *Deleted

## 2022-05-21 DIAGNOSIS — H50012 Monocular esotropia, left eye: Secondary | ICD-10-CM | POA: Diagnosis not present

## 2022-05-29 ENCOUNTER — Other Ambulatory Visit: Payer: Self-pay

## 2022-05-29 DIAGNOSIS — I129 Hypertensive chronic kidney disease with stage 1 through stage 4 chronic kidney disease, or unspecified chronic kidney disease: Secondary | ICD-10-CM | POA: Diagnosis not present

## 2022-05-29 DIAGNOSIS — E1122 Type 2 diabetes mellitus with diabetic chronic kidney disease: Secondary | ICD-10-CM | POA: Diagnosis not present

## 2022-05-29 DIAGNOSIS — N189 Chronic kidney disease, unspecified: Secondary | ICD-10-CM | POA: Diagnosis not present

## 2022-05-29 DIAGNOSIS — R809 Proteinuria, unspecified: Secondary | ICD-10-CM | POA: Diagnosis not present

## 2022-05-29 DIAGNOSIS — I5032 Chronic diastolic (congestive) heart failure: Secondary | ICD-10-CM | POA: Diagnosis not present

## 2022-06-02 ENCOUNTER — Telehealth: Payer: Self-pay | Admitting: Orthopedic Surgery

## 2022-06-02 ENCOUNTER — Telehealth: Payer: Self-pay

## 2022-06-02 NOTE — Telephone Encounter (Signed)
Patient relayed this information; states [facility] is [facility] to [facility] that a medical office visit note to [place] from Dr Aline Brochure mentioned about not being a good candidate. Patient did not mention about what is scheduled with Dr Ninfa Linden

## 2022-06-02 NOTE — Telephone Encounter (Signed)
Patient called stated that he is scheduled for surgery on 06/12/22 with Dr Ninfa Linden. He wanted to know if we had everything needed from the [place] for authorization of procedure [id number]

## 2022-06-02 NOTE — Telephone Encounter (Signed)
Call from patient, asking if he may speak with Dr Aline Brochure regarding concern about his referral to [facility] to be made to Dr Ninfa Linden; said he is having quite a time

## 2022-06-03 ENCOUNTER — Other Ambulatory Visit: Payer: Self-pay | Admitting: Physician Assistant

## 2022-06-03 DIAGNOSIS — M1711 Unilateral primary osteoarthritis, right knee: Secondary | ICD-10-CM

## 2022-06-03 NOTE — Telephone Encounter (Signed)
Spoke with Juanda Chance at [facility] office, she will call patient.  No further needed from [place].

## 2022-06-03 NOTE — Progress Notes (Signed)
Sent message, via epic in basket, requesting orders in epic from surgeon.  

## 2022-06-04 DIAGNOSIS — I129 Hypertensive chronic kidney disease with stage 1 through stage 4 chronic kidney disease, or unspecified chronic kidney disease: Secondary | ICD-10-CM | POA: Diagnosis not present

## 2022-06-04 DIAGNOSIS — R809 Proteinuria, unspecified: Secondary | ICD-10-CM | POA: Diagnosis not present

## 2022-06-04 DIAGNOSIS — E1129 Type 2 diabetes mellitus with other diabetic kidney complication: Secondary | ICD-10-CM | POA: Diagnosis not present

## 2022-06-04 DIAGNOSIS — I5032 Chronic diastolic (congestive) heart failure: Secondary | ICD-10-CM | POA: Diagnosis not present

## 2022-06-04 DIAGNOSIS — N189 Chronic kidney disease, unspecified: Secondary | ICD-10-CM | POA: Diagnosis not present

## 2022-06-04 DIAGNOSIS — E1122 Type 2 diabetes mellitus with diabetic chronic kidney disease: Secondary | ICD-10-CM | POA: Diagnosis not present

## 2022-06-04 DIAGNOSIS — D696 Thrombocytopenia, unspecified: Secondary | ICD-10-CM | POA: Diagnosis not present

## 2022-06-04 NOTE — Progress Notes (Signed)
COVID Vaccine Completed:  Yes  Date of COVID positive in last 90 days:  PCP - Louretta Shorten, NP Cardiologist - Carlyle Dolly, MD Nephrologist - Ulice Bold, MD  Cardiac clearance in Epic dated 05-15-22 by Dr. Harl Bowie  Chest x-ray -  EKG - 05-15-22 Epic Stress Test - 2017 Epic ECHO - 08-13-20 Epic Cardiac Cath -  Pacemaker/ICD device last checked: Spinal Cord Stimulator:  Bowel Prep -   Sleep Study -  CPAP -   Fasting Blood Sugar -  Checks Blood Sugar _____ times a day  Blood Thinner Instructions:  Eliquis.  Hold x2 days per clearance Aspirin Instructions: Last Dose:  Activity level:  Can go up a flight of stairs and perform activities of daily living without stopping and without symptoms of chest pain or shortness of breath.  Able to exercise without symptoms  Unable to go up a flight of stairs without symptoms of     Anesthesia review:  , CAD, Afib, HTN, COPD, DM, CKD, carotid bruits  Patient denies shortness of breath, fever, cough and chest pain at PAT appointment  Patient verbalized understanding of instructions that were given to them at the PAT appointment. Patient was also instructed that they will need to review over the PAT instructions again at home before surgery.

## 2022-06-04 NOTE — Patient Instructions (Signed)
SURGICAL WAITING ROOM VISITATION Patients having surgery or a procedure may have no more than 2 support people in the waiting area - these visitors may rotate.   Children under the age of 50 must have an adult with them who is not the patient. If the patient needs to stay at the hospital during part of their recovery, the visitor guidelines for inpatient rooms apply. Pre-op nurse will coordinate an appropriate time for 1 support person to accompany patient in pre-op.  This support person may not rotate.    Please refer to the [facility] website for the visitor guidelines for Inpatients (after your surgery is over and you are in a regular room).      Your procedure is scheduled on: 06-12-22   Report to [facility] Main Entrance    Report to admitting at 7:30 AM   Call this number if you have problems the morning of surgery [phone]   Do not eat food :After Midnight.   After Midnight you may have the following liquids until 7:00 AM DAY OF SURGERY  Water Non-Citrus Juices (without pulp, NO RED) Carbonated Beverages Black Coffee (NO MILK/CREAM OR CREAMERS, sugar ok)  Clear Tea (NO MILK/CREAM OR CREAMERS, sugar ok) regular and decaf                             Plain Jell-O (NO RED)                                           Fruit ices (not with fruit pulp, NO RED)                                     Popsicles (NO RED)                                                               Sports drinks like Gatorade (NO RED)                   The day of surgery:  Drink ONE (1) Pre-Surgery G2 at 7:00 AM the morning of surgery. Drink in one sitting. Do not sip.  This drink was given to you during your hospital  pre-op appointment visit. Nothing else to drink after completing the Pre-Surgery G2.          If you have questions, please contact your surgeon's office.   FOLLOW ANY ADDITIONAL PRE OP INSTRUCTIONS YOU RECEIVED FROM YOUR SURGEON'S OFFICE!!!     Oral Hygiene is also  important to reduce your risk of infection.                                    Remember - BRUSH YOUR TEETH THE MORNING OF SURGERY WITH YOUR REGULAR TOOTHPASTE   Do NOT smoke after Midnight   Take these medicines the morning of surgery with A SIP OF WATER: Carvedilol, Duloxetine, Famotidine, Finasteride, Gabapentin  How to Manage Your Diabetes Before and After Surgery  Why is it important to control my blood sugar before and after surgery? Improving blood sugar levels before and after surgery helps healing and can limit problems. A way of improving blood sugar control is eating a healthy diet by:  Eating less sugar and carbohydrates  Increasing activity/exercise  Talking with your doctor about reaching your blood sugar goals High blood sugars (greater than 180 mg/dL) can raise your risk of infections and slow your recovery, so you will need to focus on controlling your diabetes during the weeks before surgery. Make sure that the doctor who takes care of your diabetes knows about your planned surgery including the date and location.  How do I manage my blood sugar before surgery? Check your blood sugar at least 4 times a day, starting 2 days before surgery, to make sure that the level is not too high or low. Check your blood sugar the morning of your surgery when you wake up and every 2 hours until you get to the Short Stay unit. If your blood sugar is less than 70 mg/dL, you will need to treat for low blood sugar: Do not take insulin. Treat a low blood sugar (less than 70 mg/dL) with  cup of clear juice (cranberry or apple), 4 glucose tablets, OR glucose gel. Recheck blood sugar in 15 minutes after treatment (to make sure it is greater than 70 mg/dL). If your blood sugar is not greater than 70 mg/dL on recheck, call [phone] for further instructions. Report your blood sugar to the short stay nurse when you get to Short Stay.  If you are admitted to the hospital after surgery: Your  blood sugar will be checked by the staff and you will probably be given insulin after surgery (instead of oral diabetes medicines) to make sure you have good blood sugar levels. The goal for blood sugar control after surgery is 80-180 mg/dL.   WHAT DO I DO ABOUT MY DIABETES MEDICATION?  Do not take oral diabetes medicines (pills) the morning of surgery.  Hold Farxiga three days before surgery     THE MORNING OF SURGERY:  Do not take [place] or Metformin.   Reviewed and Endorsed by [facility] Patient Education Committee, August 2015                               You may not have any metal on your body including jewelry, and body piercing             Do not wear  lotions, powders, cologne, or deodorant              Men may shave face and neck.   Do not bring valuables to the hospital. [place].   Contacts, dentures or bridgework may not be worn into surgery.   Bring small overnight bag day of surgery.   DO NOT [place]. PHARMACY WILL DISPENSE MEDICATIONS LISTED ON YOUR MEDICATION LIST TO YOU DURING YOUR ADMISSION [place]!  Please read over the following fact sheets you were given: IF YOU HAVE QUESTIONS ABOUT YOUR PRE-OP INSTRUCTIONS PLEASE CALL [place] - Preparing for Surgery Before surgery, you can play an important role.  Because skin is not sterile, your skin needs to be as free of germs as possible.  You can reduce the number of germs on your skin by washing  with CHG (chlorahexidine gluconate) soap before surgery.  CHG is an antiseptic cleaner which kills germs and bonds with the skin to continue killing germs even after washing. Please DO NOT use if you have an allergy to CHG or antibacterial soaps.  If your skin becomes reddened/irritated stop using the CHG and inform your nurse when you arrive at Short Stay. Do not shave (including legs and underarms) for at least 48 hours  prior to the first CHG shower.  You may shave your face/neck.  Please follow these instructions carefully:  1.  Shower with CHG Soap the night before surgery and the  morning of surgery.  2.  If you choose to wash your hair, wash your hair first as usual with your normal  shampoo.  3.  After you shampoo, rinse your hair and body thoroughly to remove the shampoo.                             4.  Use CHG as you would any other liquid soap.  You can apply chg directly to the skin and wash.  Gently with a scrungie or clean washcloth.  5.  Apply the CHG Soap to your body ONLY FROM THE NECK DOWN.   Do   not use on face/ open                           Wound or open sores. Avoid contact with eyes, ears mouth and   genitals (private parts).                       Wash face,  Genitals (private parts) with your normal soap.             6.  Wash thoroughly, paying special attention to the area where your    surgery  will be performed.  7.  Thoroughly rinse your body with warm water from the neck down.  8.  DO NOT shower/wash with your normal soap after using and rinsing off the CHG Soap.                9.  Pat yourself dry with a clean towel.            10.  Wear clean pajamas.            11.  Place clean sheets on your bed the night of your first shower and do not  sleep with pets. Day of Surgery : Do not apply any lotions/deodorants the morning of surgery.  Please wear clean clothes to the hospital/surgery center.  FAILURE TO FOLLOW THESE INSTRUCTIONS MAY RESULT IN THE CANCELLATION OF YOUR SURGERY  PATIENT SIGNATURE_________________________________  NURSE SIGNATURE__________________________________  ________________________________________________________________________     Nicolas Berry  An incentive spirometer is a tool that can help keep your lungs clear and active. This tool measures how well you are filling your lungs with each breath. Taking long deep breaths may help reverse or  decrease the chance of developing breathing (pulmonary) problems (especially infection) following: A long period of time when you are unable to move or be active. BEFORE THE PROCEDURE  If the spirometer includes an indicator to show your best effort, your nurse or respiratory therapist will set it to a desired goal. If possible, sit up straight or lean slightly forward. Try not to slouch. Hold the incentive spirometer in an upright  position. INSTRUCTIONS FOR USE  Sit on the edge of your bed if possible, or sit up as far as you can in bed or on a chair. Hold the incentive spirometer in an upright position. Breathe out normally. Place the mouthpiece in your mouth and seal your lips tightly around it. Breathe in slowly and as deeply as possible, raising the piston or the ball toward the top of the column. Hold your breath for 3-5 seconds or for as long as possible. Allow the piston or ball to fall to the bottom of the column. Remove the mouthpiece from your mouth and breathe out normally. Rest for a few seconds and repeat Steps 1 through 7 at least 10 times every 1-2 hours when you are awake. Take your time and take a few normal breaths between deep breaths. The spirometer may include an indicator to show your best effort. Use the indicator as a goal to work toward during each repetition. After each set of 10 deep breaths, practice coughing to be sure your lungs are clear. If you have an incision (the cut made at the time of surgery), support your incision when coughing by placing a pillow or rolled up towels firmly against it. Once you are able to get out of bed, walk around indoors and cough well. You may stop using the incentive spirometer when instructed by your caregiver.  RISKS AND COMPLICATIONS Take your time so you do not get dizzy or light-headed. If you are in pain, you may need to take or ask for pain medication before doing incentive spirometry. It is harder to take a deep breath if you  are having pain. AFTER USE Rest and breathe slowly and easily. It can be helpful to keep track of a log of your progress. Your caregiver can provide you with a simple table to help with this. If you are using the spirometer at home, follow these instructions: [place] IF:  You are having difficultly using the spirometer. You have trouble using the spirometer as often as instructed. Your pain medication is not giving enough relief while using the spirometer. You develop fever of 100.5 F (38.1 C) or higher. SEEK IMMEDIATE MEDICAL CARE IF:  You cough up bloody sputum that had not been present before. You develop fever of 102 F (38.9 C) or greater. You develop worsening pain at or near the incision site. MAKE SURE YOU:  Understand these instructions. Will watch your condition. Will get help right away if you are not doing well or get worse. Document Released: 01/18/2007 Document Revised: 11/30/2011 Document Reviewed: 03/21/2007 ExitCare Patient Information 2014 ExitCare, [place].   ________________________________________________________________________  WHAT IS A BLOOD TRANSFUSION? Blood Transfusion Information  A transfusion is the replacement of blood or some of its parts. Blood is made up of multiple cells which provide different functions. Red blood cells carry oxygen and are used for blood loss replacement. White blood cells fight against infection. Platelets control bleeding. Plasma helps clot blood. Other blood products are available for specialized needs, such as hemophilia or other clotting disorders. BEFORE THE TRANSFUSION  Who gives blood for transfusions?  Healthy volunteers who are fully evaluated to make sure their blood is safe. This is blood bank blood. Transfusion therapy is the safest it has ever been in the practice of medicine. Before blood is taken from a donor, a complete history is taken to make sure that person has no history of diseases nor engages  in risky social behavior (examples are intravenous  drug use or sexual activity with multiple partners). The donor's travel history is screened to minimize risk of transmitting infections, such as malaria. The donated blood is tested for signs of infectious diseases, such as HIV and hepatitis. The blood is then tested to be sure it is compatible with you in order to minimize the chance of a transfusion reaction. If you or a relative donates blood, this is often done in anticipation of surgery and is not appropriate for emergency situations. It takes many days to process the donated blood. RISKS AND COMPLICATIONS Although transfusion therapy is very safe and saves many lives, the main dangers of transfusion include:  Getting an infectious disease. Developing a transfusion reaction. This is an allergic reaction to something in the blood you were given. Every precaution is taken to prevent this. The decision to have a blood transfusion has been considered carefully by your caregiver before blood is given. Blood is not given unless the benefits outweigh the risks. AFTER THE TRANSFUSION Right after receiving a blood transfusion, you will usually feel much better and more energetic. This is especially true if your red blood cells have gotten low (anemic). The transfusion raises the level of the red blood cells which carry oxygen, and this usually causes an energy increase. The nurse administering the transfusion will monitor you carefully for complications. HOME CARE INSTRUCTIONS  No special instructions are needed after a transfusion. You may find your energy is better. Speak with your caregiver about any limitations on activity for underlying diseases you may have. SEEK MEDICAL CARE IF:  Your condition is not improving after your transfusion. You develop redness or irritation at the intravenous (IV) site. SEEK IMMEDIATE MEDICAL CARE IF:  Any of the following symptoms occur over the next 12 hours: Shaking  chills. You have a temperature by mouth above 102 F (38.9 C), not controlled by medicine. Chest, back, or muscle pain. People around you feel you are not acting correctly or are confused. Shortness of breath or difficulty breathing. Dizziness and fainting. You get a rash or develop hives. You have a decrease in urine output. Your urine turns a dark color or changes to pink, red, or brown. Any of the following symptoms occur over the next 10 days: You have a temperature by mouth above 102 F (38.9 C), not controlled by medicine. Shortness of breath. Weakness after normal activity. The white part of the eye turns yellow (jaundice). You have a decrease in the amount of urine or are urinating less often. Your urine turns a dark color or changes to pink, red, or brown. Document Released: 09/04/2000 Document Revised: 11/30/2011 Document Reviewed: 04/23/2008 [facility] Patient Information 2014 [place], [place].  _______________________________________________________________________

## 2022-06-05 ENCOUNTER — Other Ambulatory Visit: Payer: Self-pay

## 2022-06-05 ENCOUNTER — Encounter (HOSPITAL_COMMUNITY): Payer: Self-pay

## 2022-06-05 ENCOUNTER — Encounter (HOSPITAL_COMMUNITY)
Admission: RE | Admit: 2022-06-05 | Discharge: 2022-06-05 | Disposition: A | Discharge status: Home / Self Care | Payer: No Typology Code available for payment source | Source: Ambulatory Visit | Attending: Orthopaedic Surgery | Admitting: Orthopaedic Surgery

## 2022-06-05 VITALS — BP 141/66 | HR 58 | Temp 97.7°F | Resp 16 | Ht 70.5 in | Wt 145.4 lb

## 2022-06-05 DIAGNOSIS — J449 Chronic obstructive pulmonary disease, unspecified: Secondary | ICD-10-CM | POA: Diagnosis not present

## 2022-06-05 DIAGNOSIS — E119 Type 2 diabetes mellitus without complications: Secondary | ICD-10-CM | POA: Diagnosis not present

## 2022-06-05 DIAGNOSIS — I4891 Unspecified atrial fibrillation: Secondary | ICD-10-CM | POA: Diagnosis not present

## 2022-06-05 DIAGNOSIS — I2510 Atherosclerotic heart disease of native coronary artery without angina pectoris: Secondary | ICD-10-CM | POA: Diagnosis not present

## 2022-06-05 DIAGNOSIS — N189 Chronic kidney disease, unspecified: Secondary | ICD-10-CM | POA: Diagnosis not present

## 2022-06-05 DIAGNOSIS — I129 Hypertensive chronic kidney disease with stage 1 through stage 4 chronic kidney disease, or unspecified chronic kidney disease: Secondary | ICD-10-CM | POA: Diagnosis not present

## 2022-06-05 DIAGNOSIS — Z01818 Encounter for other preprocedural examination: Secondary | ICD-10-CM | POA: Diagnosis present

## 2022-06-05 DIAGNOSIS — F17200 Nicotine dependence, unspecified, uncomplicated: Secondary | ICD-10-CM | POA: Diagnosis not present

## 2022-06-05 DIAGNOSIS — M1711 Unilateral primary osteoarthritis, right knee: Secondary | ICD-10-CM | POA: Diagnosis not present

## 2022-06-05 HISTORY — DX: Headache, unspecified: R51.9

## 2022-06-05 HISTORY — DX: Dyspnea, unspecified: R06.00

## 2022-06-05 HISTORY — DX: Atherosclerotic heart disease of native coronary artery without angina pectoris: I25.10

## 2022-06-05 LAB — BASIC METABOLIC PANEL
Anion gap: 6 (ref 5–15)
BUN: 17 mg/dL (ref 8–23)
CO2: 28 mmol/L (ref 22–32)
Calcium: 10.1 mg/dL (ref 8.9–10.3)
Chloride: 108 mmol/L (ref 98–111)
Creatinine, Ser: 1.39 mg/dL — ABNORMAL HIGH (ref 0.61–1.24)
GFR, Estimated: 53 mL/min — ABNORMAL LOW (ref >60)
Glucose, Bld: 111 mg/dL — ABNORMAL HIGH (ref 70–99)
Potassium: 3.9 mmol/L (ref 3.5–5.1)
Sodium: 142 mmol/L (ref 135–145)

## 2022-06-05 LAB — HEMOGLOBIN A1C
Hgb A1c MFr Bld: 5.3 % (ref 4.8–5.6)
Mean Plasma Glucose: 105.41 mg/dL

## 2022-06-05 LAB — SURGICAL PCR SCREEN
MRSA, PCR: NEGATIVE
Staphylococcus aureus: NEGATIVE

## 2022-06-05 LAB — GLUCOSE, CAPILLARY: Glucose-Capillary: 172 mg/dL — ABNORMAL HIGH (ref 70–99)

## 2022-06-08 NOTE — Progress Notes (Signed)
Anesthesia Chart Review   Case: [id number] Date/Time: 06/12/22 0945   Procedure: RIGHT TOTAL KNEE ARTHROPLASTY (Right: Knee)   Anesthesia type: Choice   Pre-op diagnosis: OSTEOARTHRITIS / DEGENERATIVE JOINT DISEASE RIGHT KNEE   Location: [facility] ROOM 09 / [facility]   Surgeons: [name], MD       DISCUSSION:76 y.o. smoker with h/o HTN, COPD, CKD, DM II, CAD, atrial fibrillation (on Eliquis), right knee DJD scheduled for above procedure 06/12/2022 with Dr. Jean Rosenthal.   Pt last seen by cardiology 05/15/2022. Per OV note, "- no active acute cardiac conditions. Tolerates greater than 4 METs regularly without exertional symptoms - ok to proceed with knee surgery from cardiac standpoint. Can hold eliquis 2 days prior, resume day after."  Pt reports advised to hold Eliquis 3 days prior to procedure.   Thrombocytopenia, Platelets 118 05/29/2022. Hepatis steatosis.  VS: BP (!) 141/66   Pulse (!) 58   Temp 36.5 C (Oral)   Resp 16   Ht 5' 10.5" (1.791 m)   Wt 66 kg   SpO2 100%   BMI 20.57 kg/m   PROVIDERS: Anthoney Harada, NP is PCP   Cardiologist - Carlyle Dolly, MD  Nephrologist - Ulice Bold, MD LABS: Labs reviewed: Acceptable for surgery. (all labs ordered are listed, but only abnormal results are displayed)  Labs Reviewed  BASIC METABOLIC PANEL - Abnormal; Notable for the following components:      Result Value   Glucose, Bld 111 (*)    Creatinine, Ser 1.39 (*)    GFR, Estimated 53 (*)    All other components within normal limits  GLUCOSE, CAPILLARY - Abnormal; Notable for the following components:   Glucose-Capillary 172 (*)    All other components within normal limits  SURGICAL PCR SCREEN  HEMOGLOBIN A1C  TYPE AND SCREEN     IMAGES:   EKG:   CV: Echo 08/13/2020 1. Left ventricular ejection fraction, by estimation, is 60 to 65%. The  left ventricle has normal function. The left ventricle has no regional  wall motion abnormalities. There  is moderate left ventricular hypertrophy.  Left ventricular diastolic  parameters were normal.   2. Right ventricular systolic function is normal. The right ventricular  size is normal. There is normal pulmonary artery systolic pressure.   3. Left atrial size was mildly dilated.   4. Chordal SAM without LVOT gradient. The mitral valve is degenerative.  Mild mitral valve regurgitation. No evidence of mitral stenosis. Severe  mitral annular calcification.   5. The aortic valve is tricuspid. There is moderate calcification of the  aortic valve. There is moderate thickening of the aortic valve. Aortic  valve regurgitation is trivial. Mild aortic valve stenosis.   6. The inferior vena cava is dilated in size with >50% respiratory  variability, suggesting right atrial pressure of 8 mmHg.   Myocardial Perfusion 05/19/2016 There was no ST segment deviation noted during stress. No T wave inversion was noted during stress. The study is normal. This is a low risk study. The left ventricular ejection fraction is hyperdynamic (>65%). Past Medical History:  Diagnosis Date   Anemia    Anxiety    OA multiple joints   Arthritis    Atrial fibrillation (HCC)    BPH (benign prostatic hyperplasia)    COPD (chronic obstructive pulmonary disease) (HCC)    uses inhaler   Coronary artery disease    DDD (degenerative disc disease), lumbar    Diabetes mellitus without complication ([place])  Dyspnea    Fatty liver    GERD (gastroesophageal reflux disease)    Headache    Hyperlipidemia    Hypertension     Past Surgical History:  Procedure Laterality Date   BACK SURGERY     C6-C7   BALLOON DILATION N/A 10/01/2020   Procedure: BALLOON DILATION;  Surgeon: Eloise Harman, DO;  [facility];  Service: Endoscopy;  Laterality: N/A;   BIOPSY  10/01/2020   Procedure: BIOPSY;  Surgeon: Eloise Harman, DO;  [facility];  Service: Endoscopy;;  gastric   COLONOSCOPY WITH PROPOFOL N/A  10/01/2020   Procedure: COLONOSCOPY WITH PROPOFOL;  Surgeon: Eloise Harman, DO;  [facility];  Service: Endoscopy;  Laterality: N/A;  2:15pm, pt know new time per office   ESOPHAGOGASTRODUODENOSCOPY (EGD) WITH PROPOFOL N/A 10/01/2020   Procedure: ESOPHAGOGASTRODUODENOSCOPY (EGD) WITH PROPOFOL;  Surgeon: Eloise Harman, DO;  [facility];  Service: Endoscopy;  Laterality: N/A;   HERNIA REPAIR     left inguinal   POLYPECTOMY  10/01/2020   Procedure: POLYPECTOMY INTESTINAL;  Surgeon: Eloise Harman, DO;  [facility];  Service: Endoscopy;;   STRABISMUS SURGERY Left 10/25/2020   Procedure: STRABISMUS REPAIR LEFT EYE;  Surgeon: Everitt Amber, MD;  Location: [facility];  Service: Ophthalmology;  Laterality: Left;   TONSILLECTOMY      MEDICATIONS:  albuterol (VENTOLIN HFA) 108 (90 Base) MCG/ACT inhaler   apixaban (ELIQUIS) 5 MG TABS tablet   carvedilol (COREG) 3.125 MG tablet   Cholecalciferol 25 MCG (1000 UT) tablet   dapagliflozin propanediol (FARXIGA) 5 MG TABS tablet   DULoxetine (CYMBALTA) 60 MG capsule   famotidine (PEPCID) 20 MG tablet   ferrous sulfate 325 (65 FE) MG tablet   finasteride (PROSCAR) 5 MG tablet   fluticasone (FLONASE) 50 MCG/ACT nasal spray   Fluticasone-Salmeterol (ADVAIR) 250-50 MCG/DOSE AEPB   gabapentin (NEURONTIN) 300 MG capsule   ibuprofen (ADVIL) 200 MG tablet   losartan (COZAAR) 25 MG tablet   Menthol, Topical Analgesic, (BIOFREEZE EX)   metFORMIN (GLUCOPHAGE) 500 MG tablet   rosuvastatin (CRESTOR) 40 MG tablet   traZODone (DESYREL) 100 MG tablet   No current facility-administered medications for this encounter.   Konrad Felix Ward, PA-C [facility] Pre-Surgical Testing [phone]

## 2022-06-08 NOTE — Anesthesia Preprocedure Evaluation (Addendum)
Anesthesia Evaluation  Patient identified by MRN, date of birth, ID band Patient awake    Reviewed: Allergy & Precautions, NPO status , Patient's Chart, lab work & pertinent test results, reviewed documented beta blocker date and time   Airway Mallampati: III  TM Distance: >3 FB Neck ROM: Full    Dental  (+) Edentulous Upper, Edentulous Lower   Pulmonary COPD,  COPD inhaler, Current Smoker and Patient abstained from smoking.,  Current smoker, 90 pack year history Albuterol once a month 1.5ppd   Pulmonary exam normal breath sounds clear to auscultation       Cardiovascular hypertension (155/81 in preop), Pt. on medications and Pt. on home beta blockers + CAD  Normal cardiovascular exam+ dysrhythmias (eliquis LD 9/17) Atrial Fibrillation + Valvular Problems/Murmurs (mild MR, mild AI) MR and AI  Rhythm:Regular Rate:Normal  Echo 2021 1. Left ventricular ejection fraction, by estimation, is 60 to 65%. The  left ventricle has normal function. The left ventricle has no regional  wall motion abnormalities. There is moderate left ventricular hypertrophy.  Left ventricular diastolic  parameters were normal.  2. Right ventricular systolic function is normal. The right ventricular  size is normal. There is normal pulmonary artery systolic pressure.  3. Left atrial size was mildly dilated.  4. Chordal SAM without LVOT gradient. The mitral valve is degenerative.  Mild mitral valve regurgitation. No evidence of mitral stenosis. Severe  mitral annular calcification.  5. The aortic valve is tricuspid. There is moderate calcification of the  aortic valve. There is moderate thickening of the aortic valve. Aortic  valve regurgitation is trivial. Mild aortic valve stenosis.  6. The inferior vena cava is dilated in size with >50% respiratory  variability, suggesting right atrial pressure of 8 mmHg.    Neuro/Psych  Headaches, PSYCHIATRIC  DISORDERS Anxiety    GI/Hepatic Neg liver ROS, GERD  Controlled,  Endo/Other  diabetes, Well Controlled, Type 2, Oral Hypoglycemic Agents  Renal/GU Renal InsufficiencyRenal diseaseCr 1.4  negative genitourinary   Musculoskeletal  (+) Arthritis , Osteoarthritis,    Abdominal   Peds negative pediatric ROS (+)  Hematology negative hematology ROS (+) Hb 13.3, plt 118 on labs 05/29/22   Anesthesia Other Findings   Reproductive/Obstetrics negative OB ROS                           Anesthesia Physical Anesthesia Plan  ASA: 3  Anesthesia Plan: MAC   Post-op Pain Management: Regional block* and Ofirmev IV (intra-op)*   Induction:   PONV Risk Score and Plan: 2 and Propofol infusion and TIVA  Airway Management Planned: Natural Airway and Simple Face Mask  Additional Equipment: None  Intra-op Plan:   Post-operative Plan:   Informed Consent: I have reviewed the patients History and Physical, chart, labs and discussed the procedure including the risks, benefits and alternatives for the proposed anesthesia with the patient or authorized representative who has indicated his/her understanding and acceptance.       Plan Discussed with: CRNA  Anesthesia Plan Comments:        Anesthesia Quick Evaluation

## 2022-06-11 NOTE — H&P (Signed)
TOTAL KNEE ADMISSION H&P  Patient is being admitted for right total knee arthroplasty.  Subjective:  Chief Complaint:right knee pain.  HPI: Nicolas Berry, 76 y.o. male, has a history of pain and functional disability in the right knee due to arthritis and has failed non-surgical conservative treatments for greater than 12 weeks to includeNSAID's and/or analgesics, corticosteriod injections, use of assistive devices, and activity modification.  Onset of symptoms was gradual, starting 5 years ago with gradually worsening course since that time. The patient noted no past surgery on the right knee(s).  Patient currently rates pain in the right knee(s) at 10 out of 10 with activity. Patient has night pain, worsening of pain with activity and weight bearing, pain that interferes with activities of daily living, pain with passive range of motion, crepitus, and joint swelling.  Patient has evidence of subchondral sclerosis, periarticular osteophytes, and joint space narrowing by imaging studies. This patient has had no active infection.  Patient Active Problem List   Diagnosis Date Noted   Unilateral primary osteoarthritis, right knee 05/06/2022   IDA (iron deficiency anemia) 09/11/2020   Melena 09/11/2020   Dysphagia 09/11/2020   GERD (gastroesophageal reflux disease) 09/11/2020   Constipation 09/11/2020   Urinary frequency 01/03/2020   Anticoagulated-Eliquis started 05/19/2016   Troponin level elevated 05/19/2016   Chest pain with moderate risk of acute coronary syndrome 05/18/2016   Atrial fibrillation with RVR ([place]) 05/17/2016   Diabetes mellitus ([place]) 09/09/2012   Essential hypertension, benign 09/09/2012   Hyperlipidemia 09/09/2012   BPH (benign prostatic hyperplasia) 09/09/2012   COPD (chronic obstructive pulmonary disease) ([place]) 09/09/2012   Generalized OA 09/09/2012   Anxiety 09/09/2012   Tobacco use 09/09/2012   Past Medical History:  Diagnosis Date   Anemia    Anxiety    OA  multiple joints   Arthritis    Atrial fibrillation (HCC)    BPH (benign prostatic hyperplasia)    COPD (chronic obstructive pulmonary disease) ([place])    uses inhaler   Coronary artery disease    DDD (degenerative disc disease), lumbar    Diabetes mellitus without complication ([place])    Dyspnea    Fatty liver    GERD (gastroesophageal reflux disease)    Headache    Hyperlipidemia    Hypertension     Past Surgical History:  Procedure Laterality Date   BACK SURGERY     C6-C7   BALLOON DILATION N/A 10/01/2020   Procedure: BALLOON DILATION;  Surgeon: Eloise Harman, DO;  [facility];  Service: Endoscopy;  Laterality: N/A;   BIOPSY  10/01/2020   Procedure: BIOPSY;  Surgeon: Eloise Harman, DO;  [facility];  Service: Endoscopy;;  gastric   COLONOSCOPY WITH PROPOFOL N/A 10/01/2020   Procedure: COLONOSCOPY WITH PROPOFOL;  Surgeon: Eloise Harman, DO;  [facility];  Service: Endoscopy;  Laterality: N/A;  2:15pm, pt know new time per office   ESOPHAGOGASTRODUODENOSCOPY (EGD) WITH PROPOFOL N/A 10/01/2020   Procedure: ESOPHAGOGASTRODUODENOSCOPY (EGD) WITH PROPOFOL;  Surgeon: Eloise Harman, DO;  [facility];  Service: Endoscopy;  Laterality: N/A;   HERNIA REPAIR     left inguinal   POLYPECTOMY  10/01/2020   Procedure: POLYPECTOMY INTESTINAL;  Surgeon: Eloise Harman, DO;  [facility];  Service: Endoscopy;;   STRABISMUS SURGERY Left 10/25/2020   Procedure: STRABISMUS REPAIR LEFT EYE;  Surgeon: Everitt Amber, MD;  Location: [facility];  Service: Ophthalmology;  Laterality: Left;   TONSILLECTOMY  No current facility-administered medications for this encounter.   Current Outpatient Medications  Medication Sig Dispense Refill Last Dose   albuterol (VENTOLIN HFA) 108 (90 Base) MCG/ACT inhaler Inhale 2 puffs into the lungs every 4 (four) hours as needed for wheezing or shortness of breath.      apixaban  (ELIQUIS) 5 MG TABS tablet Take 1 tablet (5 mg total) by mouth 2 (two) times daily. 180 tablet 1    carvedilol (COREG) 3.125 MG tablet TAKE 1 TABLET BY MOUTH TWICE DAILY STOPPING  CARDIZEM 180 tablet 3    Cholecalciferol 25 MCG (1000 UT) tablet Take 1,000 Units by mouth daily.      dapagliflozin propanediol (FARXIGA) 5 MG TABS tablet Take 5 mg by mouth daily.      DULoxetine (CYMBALTA) 60 MG capsule Take 60 mg by mouth every evening.      famotidine (PEPCID) 20 MG tablet Take 20 mg by mouth daily.      ferrous sulfate 325 (65 FE) MG tablet Take 325 mg by mouth daily with breakfast.      finasteride (PROSCAR) 5 MG tablet Take 5 mg by mouth daily.      fluticasone (FLONASE) 50 MCG/ACT nasal spray Place 1 spray into both nostrils daily as needed for allergies or rhinitis.      Fluticasone-Salmeterol (ADVAIR) 250-50 MCG/DOSE AEPB Inhale 1 puff into the lungs 2 (two) times daily as needed (shortness of breath).      gabapentin (NEURONTIN) 300 MG capsule Take 300 mg by mouth 3 (three) times daily.  1    ibuprofen (ADVIL) 200 MG tablet Take 800 mg by mouth every 6 (six) hours as needed.      losartan (COZAAR) 25 MG tablet Take 2 tablets (50 mg total) by mouth every morning AND 1 tablet (25 mg total) every evening. (Patient taking differently: Take 25 mg by mouth daily )      Menthol, Topical Analgesic, (BIOFREEZE EX) Apply 1 Application topically at bedtime.      metFORMIN (GLUCOPHAGE) 500 MG tablet Take 500 mg by mouth 2 (two) times daily with a meal.      rosuvastatin (CRESTOR) 40 MG tablet Take 40 mg by mouth at bedtime.      traZODone (DESYREL) 100 MG tablet Take 100 mg by mouth at bedtime.      No Known Allergies  Social History   Tobacco Use   Smoking status: Every Day    Packs/day: 1.50    Years: 60.00    Total pack years: 90.00    Types: Cigarettes    Start date: 08/06/1960   Smokeless tobacco: Never  Substance Use Topics   Alcohol use: Yes    Comment: rarely    Family History   Problem Relation Age of Onset   Cancer Mother        bladder   Diabetes Father    Cancer Sister        breast    Colon cancer Neg Hx      Review of Systems  Musculoskeletal:  Positive for gait problem and joint swelling.  All other systems reviewed and are negative.   Objective:  Physical Exam Vitals reviewed.  Constitutional:      Appearance: Normal appearance.  HENT:     Head: Normocephalic and atraumatic.  Eyes:     Extraocular Movements: Extraocular movements intact.     Pupils: Pupils are equal, round, and reactive to light.  Cardiovascular:     Rate and Rhythm: Normal  rate. Rhythm irregular.  Pulmonary:     Effort: Pulmonary effort is normal.     Breath sounds: Normal breath sounds.  Abdominal:     Palpations: Abdomen is soft.  Musculoskeletal:     Cervical back: Normal range of motion and neck supple.     Right knee: Effusion, bony tenderness and crepitus present. Decreased range of motion. Tenderness present over the medial joint line and lateral joint line. Abnormal alignment and abnormal meniscus.  Neurological:     Mental Status: He is alert and oriented to person, place, and time.  Psychiatric:        Behavior: Behavior normal.     Vital signs in last 24 hours:    Labs:   Estimated body mass index is 20.57 kg/m as calculated from the following:   Height as of 06/05/22: 5' 10.5" (1.791 m).   Weight as of 06/05/22: 66 kg.   Imaging Review Plain radiographs demonstrate severe degenerative joint disease of the right knee(s). The overall alignment ismild varus. The bone quality appears to be good for age and reported activity level.      Assessment/Plan:  End stage arthritis, right knee   The patient history, physical examination, clinical judgment of the provider and imaging studies are consistent with end stage degenerative joint disease of the right knee(s) and total knee arthroplasty is deemed medically necessary. The treatment options  including medical management, injection therapy arthroscopy and arthroplasty were discussed at length. The risks and benefits of total knee arthroplasty were presented and reviewed. The risks due to aseptic loosening, infection, stiffness, patella tracking problems, thromboembolic complications and other imponderables were discussed. The patient acknowledged the explanation, agreed to proceed with the plan and consent was signed. Patient is being admitted for inpatient treatment for surgery, pain control, PT, OT, prophylactic antibiotics, VTE prophylaxis, progressive ambulation and ADL's and discharge planning. The patient is planning to be discharged home with home health services

## 2022-06-12 ENCOUNTER — Observation Stay (HOSPITAL_COMMUNITY): Payer: No Typology Code available for payment source

## 2022-06-12 ENCOUNTER — Other Ambulatory Visit: Payer: Self-pay

## 2022-06-12 ENCOUNTER — Observation Stay (HOSPITAL_COMMUNITY)
Admission: RE | Admit: 2022-06-12 | Discharge: 2022-06-13 | Disposition: A | Discharge status: Home Health | Payer: No Typology Code available for payment source | Source: Ambulatory Visit | Attending: Orthopaedic Surgery | Admitting: Orthopaedic Surgery

## 2022-06-12 ENCOUNTER — Encounter (HOSPITAL_COMMUNITY): Payer: Self-pay | Admitting: Orthopaedic Surgery

## 2022-06-12 ENCOUNTER — Ambulatory Visit (HOSPITAL_BASED_OUTPATIENT_CLINIC_OR_DEPARTMENT_OTHER): Payer: No Typology Code available for payment source | Admitting: Certified Registered"

## 2022-06-12 ENCOUNTER — Ambulatory Visit (HOSPITAL_COMMUNITY): Payer: No Typology Code available for payment source | Admitting: Physician Assistant

## 2022-06-12 ENCOUNTER — Encounter (HOSPITAL_COMMUNITY)
Admission: RE | Disposition: A | Discharge status: Home Health | Payer: Self-pay | Source: Ambulatory Visit | Attending: Orthopaedic Surgery

## 2022-06-12 DIAGNOSIS — Z7901 Long term (current) use of anticoagulants: Secondary | ICD-10-CM | POA: Diagnosis not present

## 2022-06-12 DIAGNOSIS — I4891 Unspecified atrial fibrillation: Secondary | ICD-10-CM | POA: Insufficient documentation

## 2022-06-12 DIAGNOSIS — E119 Type 2 diabetes mellitus without complications: Secondary | ICD-10-CM | POA: Diagnosis not present

## 2022-06-12 DIAGNOSIS — Z7984 Long term (current) use of oral hypoglycemic drugs: Secondary | ICD-10-CM | POA: Diagnosis not present

## 2022-06-12 DIAGNOSIS — Z79899 Other long term (current) drug therapy: Secondary | ICD-10-CM | POA: Insufficient documentation

## 2022-06-12 DIAGNOSIS — I10 Essential (primary) hypertension: Secondary | ICD-10-CM | POA: Diagnosis not present

## 2022-06-12 DIAGNOSIS — I2510 Atherosclerotic heart disease of native coronary artery without angina pectoris: Secondary | ICD-10-CM

## 2022-06-12 DIAGNOSIS — F17210 Nicotine dependence, cigarettes, uncomplicated: Secondary | ICD-10-CM | POA: Diagnosis not present

## 2022-06-12 DIAGNOSIS — J449 Chronic obstructive pulmonary disease, unspecified: Secondary | ICD-10-CM | POA: Diagnosis not present

## 2022-06-12 DIAGNOSIS — M1711 Unilateral primary osteoarthritis, right knee: Principal | ICD-10-CM | POA: Insufficient documentation

## 2022-06-12 DIAGNOSIS — Z96651 Presence of right artificial knee joint: Secondary | ICD-10-CM | POA: Diagnosis not present

## 2022-06-12 HISTORY — PX: TOTAL KNEE ARTHROPLASTY: SHX125

## 2022-06-12 LAB — TYPE AND SCREEN
ABO/RH(D): O POS
Antibody Screen: NEGATIVE

## 2022-06-12 LAB — GLUCOSE, CAPILLARY
Glucose-Capillary: 124 mg/dL — ABNORMAL HIGH (ref 70–99)
Glucose-Capillary: 132 mg/dL — ABNORMAL HIGH (ref 70–99)

## 2022-06-12 LAB — ABO/RH: ABO/RH(D): O POS

## 2022-06-12 SURGERY — ARTHROPLASTY, KNEE, TOTAL
Anesthesia: Monitor Anesthesia Care | Site: Knee | Laterality: Right

## 2022-06-12 MED ORDER — METOCLOPRAMIDE HCL 5 MG PO TABS: 5.0000 mg | ORAL_TABLET | Freq: Three times a day (TID) | ORAL | Status: DC | PRN

## 2022-06-12 MED ORDER — HYDROMORPHONE HCL 1 MG/ML IJ SOLN: 0.2500 mg | INTRAMUSCULAR | Status: DC | PRN

## 2022-06-12 MED ORDER — CARVEDILOL 3.125 MG PO TABS
3.1250 mg | ORAL_TABLET | Freq: Two times a day (BID) | ORAL | Status: DC
  Administered 2022-06-12 – 2022-06-13 (×2): 3.125 mg via ORAL
  Filled 2022-06-12 (×2): qty 1

## 2022-06-12 MED ORDER — BUPIVACAINE IN DEXTROSE 0.75-8.25 % IT SOLN
INTRATHECAL | Status: DC | PRN
  Administered 2022-06-12: 1.6 mL via INTRATHECAL

## 2022-06-12 MED ORDER — CHLORHEXIDINE GLUCONATE 0.12 % MT SOLN
15.0000 mL | Freq: Once | OROMUCOSAL | Status: AC
  Administered 2022-06-12: 15 mL via OROMUCOSAL

## 2022-06-12 MED ORDER — VITAMIN D 25 MCG (1000 UNIT) PO TABS
1000.0000 [IU] | ORAL_TABLET | Freq: Every day | ORAL | Status: DC
  Administered 2022-06-12 – 2022-06-13 (×2): 1000 [IU] via ORAL
  Filled 2022-06-12 (×2): qty 1

## 2022-06-12 MED ORDER — SODIUM CHLORIDE 0.9 % IR SOLN
Status: DC | PRN
  Administered 2022-06-12: 1000 mL

## 2022-06-12 MED ORDER — DEXAMETHASONE SODIUM PHOSPHATE 10 MG/ML IJ SOLN
INTRAMUSCULAR | Status: AC
  Filled 2022-06-12: qty 1

## 2022-06-12 MED ORDER — ACETAMINOPHEN 10 MG/ML IV SOLN
INTRAVENOUS | Status: AC
  Filled 2022-06-12: qty 100

## 2022-06-12 MED ORDER — ONDANSETRON HCL 4 MG PO TABS: 4.0000 mg | ORAL_TABLET | Freq: Four times a day (QID) | ORAL | Status: DC | PRN

## 2022-06-12 MED ORDER — BUPIVACAINE-EPINEPHRINE 0.25% -1:200000 IJ SOLN
INTRAMUSCULAR | Status: DC | PRN
  Administered 2022-06-12: 30 mL

## 2022-06-12 MED ORDER — METOCLOPRAMIDE HCL 5 MG/ML IJ SOLN: 5.0000 mg | Freq: Three times a day (TID) | INTRAMUSCULAR | Status: DC | PRN

## 2022-06-12 MED ORDER — DAPAGLIFLOZIN PROPANEDIOL 5 MG PO TABS
5.0000 mg | ORAL_TABLET | Freq: Every day | ORAL | Status: DC
  Administered 2022-06-13: 5 mg via ORAL
  Filled 2022-06-12: qty 1

## 2022-06-12 MED ORDER — DIPHENHYDRAMINE HCL 12.5 MG/5ML PO ELIX
12.5000 mg | ORAL_SOLUTION | ORAL | Status: DC | PRN
  Administered 2022-06-12: 25 mg via ORAL
  Filled 2022-06-12: qty 10

## 2022-06-12 MED ORDER — POVIDONE-IODINE 10 % EX SWAB
2.0000 | Freq: Once | CUTANEOUS | Status: AC
  Administered 2022-06-12: 2 via TOPICAL

## 2022-06-12 MED ORDER — ROSUVASTATIN CALCIUM 20 MG PO TABS
40.0000 mg | ORAL_TABLET | Freq: Every day | ORAL | Status: DC
  Administered 2022-06-12: 40 mg via ORAL
  Filled 2022-06-12: qty 2

## 2022-06-12 MED ORDER — GABAPENTIN 300 MG PO CAPS
300.0000 mg | ORAL_CAPSULE | Freq: Three times a day (TID) | ORAL | Status: DC
  Administered 2022-06-12 – 2022-06-13 (×3): 300 mg via ORAL
  Filled 2022-06-12 (×3): qty 1

## 2022-06-12 MED ORDER — ASPIRIN 81 MG PO CHEW: 81.0000 mg | CHEWABLE_TABLET | Freq: Two times a day (BID) | ORAL | Status: DC

## 2022-06-12 MED ORDER — CEFAZOLIN SODIUM-DEXTROSE 1-4 GM/50ML-% IV SOLN
1.0000 g | Freq: Four times a day (QID) | INTRAVENOUS | Status: AC
  Administered 2022-06-12 (×2): 1 g via INTRAVENOUS
  Filled 2022-06-12 (×2): qty 50

## 2022-06-12 MED ORDER — FLUTICASONE PROPIONATE 50 MCG/ACT NA SUSP: 1.0000 | Freq: Every day | NASAL | Status: DC | PRN

## 2022-06-12 MED ORDER — LACTATED RINGERS IV SOLN: INTRAVENOUS | Status: DC

## 2022-06-12 MED ORDER — ORAL CARE MOUTH RINSE: 15.0000 mL | Freq: Once | OROMUCOSAL | Status: AC

## 2022-06-12 MED ORDER — OXYCODONE HCL 5 MG PO TABS
5.0000 mg | ORAL_TABLET | ORAL | Status: DC | PRN
  Administered 2022-06-12 – 2022-06-13 (×5): 10 mg via ORAL
  Filled 2022-06-12 (×5): qty 2

## 2022-06-12 MED ORDER — DOCUSATE SODIUM 100 MG PO CAPS
100.0000 mg | ORAL_CAPSULE | Freq: Two times a day (BID) | ORAL | Status: DC
  Administered 2022-06-12 – 2022-06-13 (×3): 100 mg via ORAL
  Filled 2022-06-12 (×3): qty 1

## 2022-06-12 MED ORDER — HYDROMORPHONE HCL 1 MG/ML IJ SOLN: 0.5000 mg | INTRAMUSCULAR | Status: DC | PRN

## 2022-06-12 MED ORDER — OXYCODONE HCL 5 MG PO TABS: 5.0000 mg | ORAL_TABLET | Freq: Once | ORAL | Status: DC | PRN

## 2022-06-12 MED ORDER — DEXAMETHASONE SODIUM PHOSPHATE 10 MG/ML IJ SOLN
INTRAMUSCULAR | Status: DC | PRN
  Administered 2022-06-12: 8 mg
  Administered 2022-06-12: 10 mg

## 2022-06-12 MED ORDER — PROPOFOL 500 MG/50ML IV EMUL
INTRAVENOUS | Status: DC | PRN
  Administered 2022-06-12: 50 ug/kg/min via INTRAVENOUS

## 2022-06-12 MED ORDER — 0.9 % SODIUM CHLORIDE (POUR BTL) OPTIME
TOPICAL | Status: DC | PRN
  Administered 2022-06-12: 1000 mL

## 2022-06-12 MED ORDER — APIXABAN 5 MG PO TABS
5.0000 mg | ORAL_TABLET | Freq: Two times a day (BID) | ORAL | Status: DC
  Administered 2022-06-13: 5 mg via ORAL
  Filled 2022-06-12: qty 1

## 2022-06-12 MED ORDER — FENTANYL CITRATE PF 50 MCG/ML IJ SOSY
50.0000 ug | PREFILLED_SYRINGE | INTRAMUSCULAR | Status: DC
  Administered 2022-06-12: 100 ug via INTRAVENOUS
  Filled 2022-06-12: qty 2

## 2022-06-12 MED ORDER — ALUM & MAG HYDROXIDE-SIMETH 200-200-20 MG/5ML PO SUSP: 30.0000 mL | ORAL | Status: DC | PRN

## 2022-06-12 MED ORDER — FAMOTIDINE 20 MG PO TABS
20.0000 mg | ORAL_TABLET | Freq: Every day | ORAL | Status: DC
  Administered 2022-06-13: 20 mg via ORAL
  Filled 2022-06-12: qty 1

## 2022-06-12 MED ORDER — ACETAMINOPHEN 325 MG PO TABS
325.0000 mg | ORAL_TABLET | Freq: Four times a day (QID) | ORAL | Status: DC | PRN
  Administered 2022-06-13: 650 mg via ORAL
  Filled 2022-06-12: qty 2

## 2022-06-12 MED ORDER — LOSARTAN POTASSIUM 25 MG PO TABS
25.0000 mg | ORAL_TABLET | Freq: Every day | ORAL | Status: DC
  Administered 2022-06-12 – 2022-06-13 (×2): 25 mg via ORAL
  Filled 2022-06-12 (×2): qty 1

## 2022-06-12 MED ORDER — PHENOL 1.4 % MT LIQD: 1.0000 | OROMUCOSAL | Status: DC | PRN

## 2022-06-12 MED ORDER — METFORMIN HCL 500 MG PO TABS
500.0000 mg | ORAL_TABLET | Freq: Two times a day (BID) | ORAL | Status: DC
  Administered 2022-06-12 – 2022-06-13 (×2): 500 mg via ORAL
  Filled 2022-06-12 (×2): qty 1

## 2022-06-12 MED ORDER — PHENYLEPHRINE HCL-NACL 20-0.9 MG/250ML-% IV SOLN
INTRAVENOUS | Status: AC
  Filled 2022-06-12: qty 250

## 2022-06-12 MED ORDER — MENTHOL 3 MG MT LOZG: 1.0000 | LOZENGE | OROMUCOSAL | Status: DC | PRN

## 2022-06-12 MED ORDER — AMISULPRIDE (ANTIEMETIC) 5 MG/2ML IV SOLN: 10.0000 mg | Freq: Once | INTRAVENOUS | Status: DC | PRN

## 2022-06-12 MED ORDER — ONDANSETRON HCL 4 MG/2ML IJ SOLN: 4.0000 mg | Freq: Four times a day (QID) | INTRAMUSCULAR | Status: DC | PRN

## 2022-06-12 MED ORDER — METHOCARBAMOL 500 MG PO TABS
500.0000 mg | ORAL_TABLET | Freq: Four times a day (QID) | ORAL | Status: DC | PRN
  Administered 2022-06-12 – 2022-06-13 (×2): 500 mg via ORAL
  Filled 2022-06-12 (×2): qty 1

## 2022-06-12 MED ORDER — ONDANSETRON HCL 4 MG/2ML IJ SOLN
INTRAMUSCULAR | Status: AC
  Filled 2022-06-12: qty 2

## 2022-06-12 MED ORDER — METHOCARBAMOL 500 MG IVPB - SIMPLE MED: 500.0000 mg | Freq: Four times a day (QID) | INTRAVENOUS | Status: DC | PRN

## 2022-06-12 MED ORDER — ROPIVACAINE HCL 5 MG/ML IJ SOLN
INTRAMUSCULAR | Status: DC | PRN
  Administered 2022-06-12: 30 mL via PERINEURAL

## 2022-06-12 MED ORDER — MIDAZOLAM HCL 2 MG/2ML IJ SOLN
1.0000 mg | INTRAMUSCULAR | Status: DC
  Filled 2022-06-12: qty 2

## 2022-06-12 MED ORDER — DULOXETINE HCL 60 MG PO CPEP
60.0000 mg | ORAL_CAPSULE | Freq: Every evening | ORAL | Status: DC
  Administered 2022-06-12: 60 mg via ORAL
  Filled 2022-06-12: qty 1

## 2022-06-12 MED ORDER — FERROUS SULFATE 325 (65 FE) MG PO TABS
325.0000 mg | ORAL_TABLET | Freq: Every day | ORAL | Status: DC
  Administered 2022-06-13: 325 mg via ORAL
  Filled 2022-06-12: qty 1

## 2022-06-12 MED ORDER — ALBUTEROL SULFATE (2.5 MG/3ML) 0.083% IN NEBU: 3.0000 mL | INHALATION_SOLUTION | RESPIRATORY_TRACT | Status: DC | PRN

## 2022-06-12 MED ORDER — BUPIVACAINE-EPINEPHRINE (PF) 0.25% -1:200000 IJ SOLN
INTRAMUSCULAR | Status: AC
  Filled 2022-06-12: qty 30

## 2022-06-12 MED ORDER — ONDANSETRON HCL 4 MG/2ML IJ SOLN
INTRAMUSCULAR | Status: DC | PRN
  Administered 2022-06-12: 4 mg via INTRAVENOUS

## 2022-06-12 MED ORDER — FINASTERIDE 5 MG PO TABS
5.0000 mg | ORAL_TABLET | Freq: Every day | ORAL | Status: DC
  Administered 2022-06-13: 5 mg via ORAL
  Filled 2022-06-12: qty 1

## 2022-06-12 MED ORDER — PROPOFOL 1000 MG/100ML IV EMUL
INTRAVENOUS | Status: AC
  Filled 2022-06-12: qty 100

## 2022-06-12 MED ORDER — PHENYLEPHRINE HCL-NACL 20-0.9 MG/250ML-% IV SOLN
INTRAVENOUS | Status: DC | PRN
  Administered 2022-06-12: 50 ug/min via INTRAVENOUS

## 2022-06-12 MED ORDER — ONDANSETRON HCL 4 MG/2ML IJ SOLN: 4.0000 mg | Freq: Once | INTRAMUSCULAR | Status: DC | PRN

## 2022-06-12 MED ORDER — TRANEXAMIC ACID-NACL 1000-0.7 MG/100ML-% IV SOLN
1000.0000 mg | INTRAVENOUS | Status: AC
  Administered 2022-06-12: 1000 mg via INTRAVENOUS
  Filled 2022-06-12: qty 100

## 2022-06-12 MED ORDER — CEFAZOLIN SODIUM-DEXTROSE 2-4 GM/100ML-% IV SOLN
2.0000 g | INTRAVENOUS | Status: AC
  Administered 2022-06-12: 2 g via INTRAVENOUS
  Filled 2022-06-12: qty 100

## 2022-06-12 MED ORDER — ACETAMINOPHEN 10 MG/ML IV SOLN
INTRAVENOUS | Status: DC | PRN
  Administered 2022-06-12: 1000 mg via INTRAVENOUS

## 2022-06-12 MED ORDER — OXYCODONE HCL 5 MG/5ML PO SOLN: 5.0000 mg | Freq: Once | ORAL | Status: DC | PRN

## 2022-06-12 MED ORDER — HYDROMORPHONE HCL 2 MG PO TABS
2.0000 mg | ORAL_TABLET | ORAL | Status: DC | PRN
  Administered 2022-06-12: 2 mg via ORAL
  Filled 2022-06-12: qty 1

## 2022-06-12 MED ORDER — SODIUM CHLORIDE 0.9 % IV SOLN: INTRAVENOUS | Status: DC

## 2022-06-12 SURGICAL SUPPLY — 58 items
BAG COUNTER SPONGE SURGICOUNT (BAG) IMPLANT
BAG ZIPLOCK 12X15 (MISCELLANEOUS) ×1 IMPLANT
BENZOIN TINCTURE PRP APPL 2/3 (GAUZE/BANDAGES/DRESSINGS) IMPLANT
BLADE SAG 18X100X1.27 (BLADE) ×1 IMPLANT
BLADE SURG SZ10 CARB STEEL (BLADE) ×2 IMPLANT
BNDG ELASTIC 6X15 VLCR STRL LF (GAUZE/BANDAGES/DRESSINGS) IMPLANT
BNDG ELASTIC 6X5.8 VLCR STR LF (GAUZE/BANDAGES/DRESSINGS) ×2 IMPLANT
BOWL SMART MIX CTS (DISPOSABLE) IMPLANT
CEMENT BONE R 1X40 (Cement) IMPLANT
COMP FEM CMT PERSONA SZ9 RT (Joint) ×1 IMPLANT
COMPONENT FEM CMT PRSONA SZ9RT (Joint) IMPLANT
COOLER ICEMAN CLASSIC (MISCELLANEOUS) ×1 IMPLANT
COVER SURGICAL LIGHT HANDLE (MISCELLANEOUS) ×1 IMPLANT
CUFF TOURN SGL QUICK 34 (TOURNIQUET CUFF) ×1
CUFF TRNQT CYL 34X4.125X (TOURNIQUET CUFF) ×1 IMPLANT
DRAPE INCISE IOBAN 66X45 STRL (DRAPES) ×1 IMPLANT
DRAPE U-SHAPE 47X51 STRL (DRAPES) ×1 IMPLANT
DURAPREP 26ML APPLICATOR (WOUND CARE) ×1 IMPLANT
ELECT BLADE TIP CTD 4 INCH (ELECTRODE) ×1 IMPLANT
ELECT REM PT RETURN 15FT ADLT (MISCELLANEOUS) ×1 IMPLANT
GAUZE PAD ABD 8X10 STRL (GAUZE/BANDAGES/DRESSINGS) ×2 IMPLANT
GAUZE SPONGE 4X4 12PLY STRL (GAUZE/BANDAGES/DRESSINGS) ×1 IMPLANT
GAUZE XEROFORM 1X8 LF (GAUZE/BANDAGES/DRESSINGS) IMPLANT
GLOVE BIO SURGEON STRL SZ7.5 (GLOVE) ×1 IMPLANT
GLOVE BIOGEL PI IND STRL 8 (GLOVE) ×2 IMPLANT
GLOVE ECLIPSE 8.0 STRL XLNG CF (GLOVE) ×1 IMPLANT
GOWN STRL REUS W/ TWL XL LVL3 (GOWN DISPOSABLE) ×2 IMPLANT
GOWN STRL REUS W/TWL XL LVL3 (GOWN DISPOSABLE) ×2
HANDPIECE INTERPULSE COAX TIP (DISPOSABLE) ×1
HDLS TROCR DRIL PIN KNEE 75 (PIN) ×1
HOLDER FOLEY CATH W/STRAP (MISCELLANEOUS) IMPLANT
IMMOBILIZER KNEE 20 (SOFTGOODS) ×1
IMMOBILIZER KNEE 20 THIGH 36 (SOFTGOODS) ×1 IMPLANT
INSERT TIBIA KNEE RIGHT 10 (Joint) IMPLANT
KIT TURNOVER KIT A (KITS) IMPLANT
NS IRRIG 1000ML POUR BTL (IV SOLUTION) ×1 IMPLANT
PACK TOTAL KNEE CUSTOM (KITS) ×1 IMPLANT
PAD COLD SHLDR WRAP-ON (PAD) ×1 IMPLANT
PADDING CAST COTTON 6X4 STRL (CAST SUPPLIES) ×2 IMPLANT
PIN DRILL HDLS TROCAR 75 4PK (PIN) IMPLANT
PROTECTOR NERVE ULNAR (MISCELLANEOUS) ×1 IMPLANT
SCREW FEMALE HEX FIX 25X2.5 (ORTHOPEDIC DISPOSABLE SUPPLIES) IMPLANT
SET HNDPC FAN SPRY TIP SCT (DISPOSABLE) ×1 IMPLANT
SET PAD KNEE POSITIONER (MISCELLANEOUS) ×1 IMPLANT
SPIKE FLUID TRANSFER (MISCELLANEOUS) IMPLANT
STAPLER VISISTAT 35W (STAPLE) IMPLANT
STEM POLY PAT PLY 35M KNEE (Knees) IMPLANT
STEM TIBIA 5 DEG SZ F R KNEE (Knees) IMPLANT
STRIP CLOSURE SKIN 1/2X4 (GAUZE/BANDAGES/DRESSINGS) IMPLANT
SUT MNCRL AB 4-0 PS2 18 (SUTURE) IMPLANT
SUT VIC AB 0 CT1 27 (SUTURE) ×1
SUT VIC AB 0 CT1 27XBRD ANTBC (SUTURE) ×1 IMPLANT
SUT VIC AB 1 CT1 36 (SUTURE) ×2 IMPLANT
SUT VIC AB 2-0 CT1 27 (SUTURE) ×2
SUT VIC AB 2-0 CT1 TAPERPNT 27 (SUTURE) ×2 IMPLANT
TIBIA STEM 5 DEG SZ F R KNEE (Knees) ×1 IMPLANT
TRAY FOLEY MTR SLVR 16FR STAT (SET/KITS/TRAYS/PACK) IMPLANT
WATER STERILE IRR 1000ML POUR (IV SOLUTION) ×2 IMPLANT

## 2022-06-12 NOTE — Anesthesia Procedure Notes (Signed)
Anesthesia Regional Block: Adductor canal block   Pre-Anesthetic Checklist: , timeout performed,  Correct Patient, Correct Site, Correct Laterality,  Correct Procedure, Correct Position, site marked,  Risks and benefits discussed,  Surgical consent,  Pre-op evaluation,  At surgeon's request and post-op pain management  Laterality: Right  Prep: Maximum Sterile Barrier Precautions used, chloraprep       Needles:  Injection technique: Single-shot  Needle Type: Echogenic Stimulator Needle     Needle Length: 9cm  Needle Gauge: 22     Additional Needles:   Procedures:,,,, ultrasound used (permanent image in chart),,    Narrative:  Start time: 06/12/2022 8:55 AM End time: 06/12/2022 9:00 AM Injection made incrementally with aspirations every 5 mL.  Performed by: Personally  Anesthesiologist: Pervis Hocking, DO  Additional Notes: Monitors applied. No increased pain on injection. No increased resistance to injection. Injection made in 5cc increments. Good needle visualization. Patient tolerated procedure well.

## 2022-06-12 NOTE — Transfer of Care (Signed)
Immediate Anesthesia Transfer of Care Note  Patient: Nicolas Berry  Procedure(s) Performed: RIGHT TOTAL KNEE ARTHROPLASTY (Right: Knee)  Patient Location: PACU  Anesthesia Type:Spinal  Level of Consciousness: sedated  Airway & Oxygen Therapy: Patient Spontanous Breathing  Post-op Assessment: Report given to RN  Post vital signs: stable  Last Vitals:  Vitals Value Taken Time  BP 123/82 06/12/22 1130  Temp    Pulse 51 06/12/22 1138  Resp 9 06/12/22 1138  SpO2 100 % 06/12/22 1138  Vitals shown include unvalidated device data.  Last Pain:  Vitals:   06/12/22 0806  TempSrc: Oral  PainSc:       Patients Stated Pain Goal: 4 (18/36/72 5500)  Complications: No notable events documented.

## 2022-06-12 NOTE — Anesthesia Postprocedure Evaluation (Signed)
Anesthesia Post Note  Patient: Nicolas Berry  Procedure(s) Performed: RIGHT TOTAL KNEE ARTHROPLASTY (Right: Knee)     Patient location during evaluation: PACU Anesthesia Type: MAC, Spinal and Regional Level of consciousness: awake and alert and oriented Pain management: pain level controlled Vital Signs Assessment: post-procedure vital signs reviewed and stable Respiratory status: spontaneous breathing, nonlabored ventilation and respiratory function stable Cardiovascular status: blood pressure returned to baseline and stable Postop Assessment: no headache, no backache, spinal receding and patient able to bend at knees Anesthetic complications: no   No notable events documented.  Last Vitals:  Vitals:   06/12/22 1245 06/12/22 1305  BP: (!) 156/83 (!) 159/90  Pulse: (!) 58 (!) 52  Resp: 12 18  Temp:  (!) 36.4 C  SpO2: 100% (!) 72%    Last Pain:  Vitals:   06/12/22 1305  TempSrc: Oral  PainSc:                  Pervis Hocking

## 2022-06-12 NOTE — Plan of Care (Signed)
  Problem: Education: Goal: Knowledge of the prescribed therapeutic regimen will improve Outcome: Progressing   Problem: Activity: Goal: Ability to avoid complications of mobility impairment will improve Outcome: Progressing   Problem: Pain Management: Goal: Pain level will decrease with appropriate interventions Outcome: Progressing   

## 2022-06-12 NOTE — TOC Transition Note (Signed)
Transition of Care [facility]) - CM/SW Discharge Note   Patient Details  Name: Nicolas Berry MRN: [id number] Date of Birth: 10-Nov-1945  [facility] CM/SW Contact:  Lennart Pall, LCSW Phone Number: 06/12/2022, 2:02 PM   Clinical Narrative:    Met with patient and confirming need for RW/ no DME agency preference - order placed with [facility] for delivery to pt's room.  HHPT prearranged with [facility].  No further TOC needs.   Final next level of care: [place] Barriers to Discharge: No Barriers Identified   Patient Goals and CMS Choice Patient states their goals for this hospitalization and ongoing recovery are:: return home      Discharge Placement                       Discharge Plan and Services                DME Arranged: [place] rolling DME Agency: [employer] Date DME Agency Contacted: 06/12/22 Time DME Agency Contacted: [phone] Representative spoke with at DME Agency: [place]: [place] Determinants of Health (SDOH) Interventions     Readmission Risk Interventions     No data to display

## 2022-06-12 NOTE — Interval H&P Note (Signed)
History and Physical Interval Note: The patient understands that he is here today for a right knee replacement to treat his right knee osteoarthritis.  There has been no acute or interval changes medical status.  See H&P.  The risks and benefits of surgery been explained in detail and informed consent is obtained.  The right operative knee has been marked.  06/12/2022 8:34 AM  Nicolas Berry  has presented today for surgery, with the diagnosis of OSTEOARTHRITIS / [place].  The various methods of treatment have been discussed with the patient and family. After consideration of risks, benefits and other options for treatment, the patient has consented to  Procedure(s): RIGHT TOTAL KNEE ARTHROPLASTY (Right) as a surgical intervention.  The patient's history has been reviewed, patient examined, no change in status, stable for surgery.  I have reviewed the patient's chart and labs.  Questions were answered to the patient's satisfaction.     [name]

## 2022-06-12 NOTE — Anesthesia Procedure Notes (Signed)
Spinal  Patient location during procedure: OR Start time: 06/12/2022 9:40 AM End time: 06/12/2022 9:43 AM Reason for block: surgical anesthesia Staffing Performed: anesthesiologist  Anesthesiologist: Pervis Hocking, DO Performed by: Pervis Hocking, DO Authorized by: Pervis Hocking, DO   Preanesthetic Checklist Completed: patient identified, IV checked, risks and benefits discussed, surgical consent, monitors and equipment checked, pre-op evaluation and timeout performed Spinal Block Patient position: sitting Prep: DuraPrep and site prepped and draped Patient monitoring: cardiac monitor, continuous pulse ox and blood pressure Approach: midline Location: L3-4 Injection technique: single-shot Needle Needle type: Pencan  Needle gauge: 24 G Needle length: 9 cm Assessment Sensory level: T6 Events: CSF return Additional Notes Functioning IV was confirmed and monitors were applied. Sterile prep and drape, including hand hygiene and sterile gloves were used. The patient was positioned and the spine was prepped. The skin was anesthetized with lidocaine.  Free flow of clear CSF was obtained prior to injecting local anesthetic into the CSF.  The spinal needle aspirated freely following injection.  The needle was carefully withdrawn.  The patient tolerated the procedure well.

## 2022-06-12 NOTE — Evaluation (Signed)
Physical Therapy Evaluation Patient Details Name: Nicolas Berry MRN: [id number] DOB: 04/17/1946 Today's Date: 06/12/2022  History of Present Illness  Pt is a 76yo male presenting s/p R-TKA on 06/12/22. PMH: anemia, afib, BPH, COPD, CAD, DM, GERD, HLD HTN, cervical surgery.   Clinical Impression  Nicolas Berry is a 76 y.o. male POD 0 s/p R-TKA. Patient reports independence with mobility at baseline. Patient is now limited by functional impairments (see PT problem list below) and requires supervision for bed mobility and min guard for transfers, further mobility deferred secondary to reduced sensation on bottom of R foot. Patient instructed in exercise to facilitate ROM and circulation to manage edema. Provided incentive spirometer and with Vcs pt able to achieve 2060m. Patient will benefit from continued skilled PT interventions to address impairments and progress towards PLOF. Acute PT will follow to progress mobility and stair training in preparation for safe discharge home.       Recommendations for follow up therapy are one component of a multi-disciplinary discharge planning process, led by the attending physician.  Recommendations may be updated based on patient status, additional functional criteria and insurance authorization.  Follow Up Recommendations Follow physician's recommendations for discharge plan and follow up therapies      Assistance Recommended at Discharge Set up Supervision/Assistance  Patient can return home with the following  A little help with walking and/or transfers;A little help with bathing/dressing/bathroom;Assistance with cooking/housework;Assist for transportation;Help with stairs or ramp for entrance    Equipment Recommendations Rolling walker (2 wheels);Cane  Recommendations for Other Services       Functional Status Assessment Patient has had a recent decline in their functional status and demonstrates the ability to make significant improvements  in function in a reasonable and predictable amount of time.     Precautions / Restrictions Precautions Precautions: Fall;Knee Precaution Booklet Issued: No Precaution Comments: No pillow under the knee Required Braces or Orthoses: Knee Immobilizer - Right Knee Immobilizer - Right: Discontinue once straight leg raise with < 10 degree lag;On when out of bed or walking Restrictions Weight Bearing Restrictions: No Other Position/Activity Restrictions: 151/83      Mobility  Bed Mobility Overal bed mobility: Needs Assistance Bed Mobility: Supine to Sit     Supine to sit: Supervision     General bed mobility comments: For safety only    Transfers Overall transfer level: Needs assistance Equipment used: Rolling walker (2 wheels) Transfers: Sit to/from Stand, Bed to chair/wheelchair/BSC Sit to Stand: Min guard   Step pivot transfers: Min guard       General transfer comment: While sitting EOB pt reporting reduced sensation especially on R foot. Pt required min guard for sit to stand with VCs for sequencing. Min guard for step pivot transfer.    Ambulation/Gait               General Gait Details: deferred  Stairs            Wheelchair Mobility    Modified Rankin (Stroke Patients Only)       Balance Overall balance assessment: Needs assistance Sitting-balance support: Feet supported, No upper extremity supported Sitting balance-Leahy Scale: Good     Standing balance support: Reliant on assistive device for balance, During functional activity, Bilateral upper extremity supported Standing balance-Leahy Scale: Poor                               Pertinent Vitals/Pain Pain Assessment Pain Assessment:  0-10 Pain Score: 1  Pain Location: RIGHT KNEE Pain Descriptors / Indicators: Burning, Operative site guarding Pain Intervention(s): Limited activity within patient's tolerance, Monitored during session, Repositioned, Ice applied    Home  Living Family/patient expects to be discharged to:: Private residence Living Arrangements: Spouse/significant other Available Help at Discharge: Family;Available 24 hours/day Type of Home: House Home Access: Stairs to enter Entrance Stairs-Rails: None Entrance Stairs-Number of Steps: 3   Home Layout: One level (sunken den with 3 stpes) Home Equipment: None      Prior Function Prior Level of Function : Independent/Modified Independent;Driving             Mobility Comments: IND ADLs Comments: IND     Hand Dominance        Extremity/Trunk Assessment   Upper Extremity Assessment Upper Extremity Assessment: Overall WFL for tasks assessed    Lower Extremity Assessment Lower Extremity Assessment: RLE deficits/detail;LLE deficits/detail RLE Deficits / Details: MMT ank DF/PF 5/5, no extensor lag noted RLE Sensation: history of peripheral neuropathy (reduced sensation) LLE Deficits / Details: MMT ank DF/PF 5/5 LLE Sensation: history of peripheral neuropathy    Cervical / Trunk Assessment Cervical / Trunk Assessment: Normal  Communication   Communication: No difficulties  Cognition Arousal/Alertness: Awake/alert Behavior During Therapy: WFL for tasks assessed/performed Overall Cognitive Status: Within Functional Limits for tasks assessed                                          General Comments      Exercises Total Joint Exercises Ankle Circles/Pumps: AROM, Both, 20 reps Quad Sets: AROM, Right, 5 reps Hip ABduction/ADduction: AROM, Right, 5 reps Straight Leg Raises: AROM, Right, 5 reps   Assessment/Plan    PT Assessment Patient needs continued PT services  PT Problem List Decreased strength;Decreased range of motion;Decreased activity tolerance;Decreased balance;Decreased mobility;Decreased coordination;Pain       PT Treatment Interventions DME instruction;Gait training;Stair training;Functional mobility training;Therapeutic  activities;Therapeutic exercise;Balance training;Neuromuscular re-education;Patient/family education    PT Goals (Current goals can be found in the Care Plan section)  Acute Rehab PT Goals Patient Stated Goal: To return to golfing PT Goal Formulation: With patient Time For Goal Achievement: 06/19/22 Potential to Achieve Goals: Good    Frequency 7X/week     Co-evaluation               AM-PAC PT "6 Clicks" Mobility  Outcome Measure Help needed turning from your back to your side while in a flat bed without using bedrails?: None Help needed moving from lying on your back to sitting on the side of a flat bed without using bedrails?: A Little Help needed moving to and from a bed to a chair (including a wheelchair)?: A Little Help needed standing up from a chair using your arms (e.g., wheelchair or bedside chair)?: A Little Help needed to walk in hospital room?: A Little Help needed climbing 3-5 steps with a railing? : A Little 6 Click Score: 19    End of Session Equipment Utilized During Treatment: Gait belt;Right knee immobilizer Activity Tolerance: Patient tolerated treatment well;No increased pain Patient left: in chair;with call bell/phone within reach;with chair alarm set Nurse Communication: Mobility status PT Visit Diagnosis: Pain;Difficulty in walking, not elsewhere classified (R26.2) Pain - Right/Left: Right Pain - part of body: Knee    Time: 0355-9741 PT Time Calculation (min) (ACUTE ONLY): 37 min   Charges:  PT Evaluation $PT Eval Low Complexity: 1 Low PT Treatments $Therapeutic Exercise: 8-22 mins        Coolidge Breeze, PT, DPT [facility] Rehabilitation Department Office: [phone]  Coolidge Breeze 06/12/2022, 3:53 PM

## 2022-06-12 NOTE — Op Note (Signed)
Operative Note  Date of operation: 06/12/2022 Operative diagnosis: Right knee osteoarthritis and degenerative joint disease Postoperative diagnosis: Same  Procedure: Right cemented total knee arthroplasty  Implants: Biomet/Zimmer persona knee system with size 9 right CR standard femur, size F right tibial tray, 10 mm thickness right medial congruent polyethylene insert, 35 mm patella button  Surgeon: Lind Guest. Ninfa Linden, MD Assistant: Benita Stabile, PA-C  Anesthesia: #1 right lower extremity adductor canal block, #2 spinal, #3 local EBL: Less than 100 cc Tourniquet time: Less than 1 hour Antibiotics: 2 g IV Ancef Complications: None  Indications: Patient is a very well-known 76 year old gentleman who has debilitating arthritis involving his right knee.  This was well-documented with clinical exam and x-ray findings.  At this point his right knee pain is daily and is detrimentally affecting his mobility, his quality of life and his actives daily living to the point he does wish to proceed with a total knee arthroplasty.  We did talk in length in detail about the risk of acute blood loss anemia, neurovascular injury, fracture, infection, DVT, implant failure and skin and soft tissue healing issues.  We talked about her goals being decreased pain, improve mobility and hopefully overall improved quality of life.  Procedure description after informed consent was obtained appropriate right knee was marked anesthesia obtained regional block of the right lower extremity the holding room and he was then brought to the operating room and set up on the operative table spinal anesthesia was obtained.  He was then laid in supine position on the operating table and a nonsterile tourniquet is placed around his upper right thigh and a Foley catheter was placed.  His right thigh, knee, leg, ankle were prepped and draped with DuraPrep and sterile drapes.  Of note he had a slight flexion contracture and varus  malalignment prior to surgery.  We then used Esmarch to wrap out the leg and the tourniquet was plated to 300 mm of pressure.  We then used a #10 blade to make an incision over the patella carried this proximally distally.  We dissected down the joint carried out a medial parapatellar arthrotomy finding a large joint effusion.  With the knee in a flexed position we found osteophytes in all 3 compartments that we removed.  We remove remnants of the ACL as well as medial lateral meniscus.  We found complete the cartilage wear throughout the knee.  We then used an extramedullary cutting guide for making her proximal tibia cut correction varus and valgus and 3 degree slope.  We set this cut to take 2 mm off the low side.  We made the cut without difficulty.  We then went to the femur and used the intramedullary cutting guide for distal femoral cut setting this for a right knee at 5 degrees external rotated and a 10 mm distal femoral cut.  Made this cut without difficulty and brought the knee back down full extension and achieve full extension with a 10 mm extension block.  We then back to the femur and used a femoral sizing guide based off the epicondylar axis for the right knee at degrees and chose a size 9 femur.  We put a 4-in-1 cutting block for size 9 femur and made her anterior posterior cuts followed by her chamfer cuts.  We then back to the tibia and chose a size F right tibial tray for coverage over the tibial plateau setting the rotation of the tibial tubercle and the femur.  We then made  our keel punch and drill hole off of this.  With the trial size F right tibia we trialed a size 9 right CR and femur.  We then placed a 10 mm medial congruent trial polyethylene insert and we are pleased with range of motion and stability.  We then made our patella cut and drilled 3 holes for a size 35 patella button.  With all transportation the knee we put him through several cycles of motion and we are pleased with range  of motion and stability.  We then removed all transportation from the knee and irrigate the knee with normal saline solution.  We placed our Marcaine with epinephrine around the arthrotomy.  We mixed our cement with the knee in a flexed position we dried the knee and then cemented our size F right Biomet/Zimmer persona tibial tray followed by cementing our size right standard 9 CR femur.  We removed excess debris from the knee and placed our 10 mm fixed-bearing medial congruent right polythene insert.  We then held the knee in a compressed position fully extended and cemented our size 35 patella button.  Once the cemented hardened the tourniquet was let down and hemostasis was 10 electrocautery.  We then closed the arthrotomy with interrupted #1 Vicryl suture followed by 0 Vicryl close the deep tissue and 2-0 Vicryl close subcutaneous tissue the skin was closed with staples.  Well-padded sterile dressing was applied.  He was taken recovery in stable addition with all final counts being correct and no complications noted.  Of note Clark PA-C's assistance was medically necessary and reasonable for soft tissue retraction and management, helping guide implant placement and a layered closure of the wound.

## 2022-06-13 ENCOUNTER — Encounter (HOSPITAL_COMMUNITY): Payer: Self-pay | Admitting: Orthopaedic Surgery

## 2022-06-13 DIAGNOSIS — M1711 Unilateral primary osteoarthritis, right knee: Secondary | ICD-10-CM | POA: Diagnosis not present

## 2022-06-13 LAB — BASIC METABOLIC PANEL
Anion gap: 8 (ref 5–15)
BUN: 23 mg/dL (ref 8–23)
CO2: 24 mmol/L (ref 22–32)
Calcium: 9 mg/dL (ref 8.9–10.3)
Chloride: 106 mmol/L (ref 98–111)
Creatinine, Ser: 1.48 mg/dL — ABNORMAL HIGH (ref 0.61–1.24)
GFR, Estimated: 49 mL/min — ABNORMAL LOW (ref >60)
Glucose, Bld: 137 mg/dL — ABNORMAL HIGH (ref 70–99)
Potassium: 3.9 mmol/L (ref 3.5–5.1)
Sodium: 138 mmol/L (ref 135–145)

## 2022-06-13 LAB — CBC
HCT: 34.7 % — ABNORMAL LOW (ref 39.0–52.0)
Hemoglobin: 11 g/dL — ABNORMAL LOW (ref 13.0–17.0)
MCH: 33 pg (ref 26.0–34.0)
MCHC: 31.7 g/dL (ref 30.0–36.0)
MCV: 104.2 fL — ABNORMAL HIGH (ref 80.0–100.0)
Platelets: 127 10*3/uL — ABNORMAL LOW (ref 150–400)
RBC: 3.33 MIL/uL — ABNORMAL LOW (ref 4.22–5.81)
RDW: 13.2 % (ref 11.5–15.5)
WBC: 12.9 10*3/uL — ABNORMAL HIGH (ref 4.0–10.5)
nRBC: 0 % (ref 0.0–0.2)

## 2022-06-13 MED ORDER — OXYCODONE HCL 5 MG PO TABS: 5.0000 mg | ORAL_TABLET | Freq: Four times a day (QID) | ORAL | 0 refills | Status: DC | PRN

## 2022-06-13 MED ORDER — METHOCARBAMOL 500 MG PO TABS: 500.0000 mg | ORAL_TABLET | Freq: Four times a day (QID) | ORAL | 1 refills | Status: DC | PRN

## 2022-06-13 NOTE — Discharge Summary (Signed)
Patient ID: Nicolas Berry MRN: [id number] DOB/AGE: 76-Dec-1947 76 y.o.  Admit date: 06/12/2022 Discharge date: 06/13/2022  Admission Diagnoses:  Principal Problem:   Unilateral primary osteoarthritis, right knee Active Problems:   Status post total right knee replacement   Discharge Diagnoses:  Same  Past Medical History:  Diagnosis Date   Anemia    Anxiety    OA multiple joints   Arthritis    Atrial fibrillation (HCC)    BPH (benign prostatic hyperplasia)    COPD (chronic obstructive pulmonary disease) ([place])    uses inhaler   Coronary artery disease    DDD (degenerative disc disease), lumbar    Diabetes mellitus without complication ([place])    Dyspnea    Fatty liver    GERD (gastroesophageal reflux disease)    Headache    Hyperlipidemia    Hypertension     Surgeries: Procedure(s): RIGHT TOTAL KNEE ARTHROPLASTY on 06/12/2022   Consultants:   Discharged Condition: Improved  Hospital Course: Nicolas Berry is an 76 y.o. male who was admitted 06/12/2022 for operative treatment ofUnilateral primary osteoarthritis, right knee. Patient has severe unremitting pain that affects sleep, daily activities, and work/hobbies. After pre-op clearance the patient was taken to the operating room on 06/12/2022 and underwent  Procedure(s): RIGHT TOTAL KNEE ARTHROPLASTY.    Patient was given perioperative antibiotics:  Anti-infectives (From admission, onward)    Start     Dose/Rate Route Frequency Ordered Stop   06/12/22 1600  ceFAZolin (ANCEF) IVPB 1 g/50 mL premix        1 g 100 mL/hr over 30 Minutes Intravenous Every 6 hours 06/12/22 1315 06/12/22 2212   06/12/22 0745  ceFAZolin (ANCEF) IVPB 2g/100 mL premix        2 g 200 mL/hr over 30 Minutes Intravenous On call to O.R. 06/12/22 1696 06/12/22 0946        Patient was given sequential compression devices, early ambulation, and chemoprophylaxis to prevent DVT.  Patient benefited maximally from hospital stay and there were  no complications.    Recent vital signs: Patient Vitals for the past 24 hrs:  BP Temp Temp src Pulse Resp SpO2  06/13/22 1002 122/70 (!) 97.5 F (36.4 C) Oral 60 17 98 %  06/13/22 0417 128/67 97.8 F (36.6 C) -- 65 18 96 %  06/13/22 0050 134/70 98 F (36.7 C) Oral 65 18 97 %  06/12/22 2041 132/77 98 F (36.7 C) Oral 66 18 98 %  06/12/22 1717 (!) 151/68 97.7 F (36.5 C) Oral 60 18 90 %  06/12/22 1305 (!) 159/90 (!) 97.5 F (36.4 C) Oral (!) 52 18 (!) 72 %  06/12/22 1245 (!) 156/83 -- -- (!) 58 12 100 %  06/12/22 1230 (!) 147/74 -- -- (!) 57 13 100 %  06/12/22 1215 (!) 142/67 -- -- 61 16 100 %     Recent laboratory studies:  Recent Labs    06/13/22 0334  WBC 12.9*  HGB 11.0*  HCT 34.7*  PLT 127*  NA 138  K 3.9  CL 106  CO2 24  BUN 23  CREATININE 1.48*  GLUCOSE 137*  CALCIUM 9.0     Discharge Medications:   Allergies as of 06/13/2022   No Known Allergies      Medication List     TAKE these medications    albuterol 108 (90 Base) MCG/ACT inhaler Commonly known as: VENTOLIN HFA Inhale 2 puffs into the lungs every 4 (four) hours as needed for wheezing or shortness of  breath.   apixaban 5 MG Tabs tablet Commonly known as: ELIQUIS Take 1 tablet (5 mg total) by mouth 2 (two) times daily.   BIOFREEZE EX Apply 1 Application topically at bedtime.   carvedilol 3.125 MG tablet Commonly known as: COREG TAKE 1 TABLET BY MOUTH TWICE DAILY STOPPING  CARDIZEM   Cholecalciferol 25 MCG (1000 UT) tablet Take 1,000 Units by mouth daily.   DULoxetine 60 MG capsule Commonly known as: CYMBALTA Take 60 mg by mouth every evening.   famotidine 20 MG tablet Commonly known as: PEPCID Take 20 mg by mouth daily.   Farxiga 5 MG Tabs tablet Generic drug: dapagliflozin propanediol Take 5 mg by mouth daily.   ferrous sulfate 325 (65 FE) MG tablet Take 325 mg by mouth daily with breakfast.   finasteride 5 MG tablet Commonly known as: PROSCAR Take 5 mg by mouth daily.    fluticasone 50 MCG/ACT nasal spray Commonly known as: FLONASE Place 1 spray into both nostrils daily as needed for allergies or rhinitis.   Fluticasone-Salmeterol 250-50 MCG/DOSE Aepb Commonly known as: ADVAIR Inhale 1 puff into the lungs 2 (two) times daily as needed (shortness of breath).   gabapentin 300 MG capsule Commonly known as: NEURONTIN Take 300 mg by mouth 3 (three) times daily.   ibuprofen 200 MG tablet Commonly known as: ADVIL Take 800 mg by mouth every 6 (six) hours as needed.   losartan 25 MG tablet Commonly known as: COZAAR Take 2 tablets (50 mg total) by mouth every morning AND 1 tablet (25 mg total) every evening. What changed: See the new instructions.   metFORMIN 500 MG tablet Commonly known as: GLUCOPHAGE Take 500 mg by mouth 2 (two) times daily with a meal.   methocarbamol 500 MG tablet Commonly known as: ROBAXIN Take 1 tablet (500 mg total) by mouth every 6 (six) hours as needed for muscle spasms.   oxyCODONE 5 MG immediate release tablet Commonly known as: Oxy IR/ROXICODONE Take 1-2 tablets (5-10 mg total) by mouth every 6 (six) hours as needed for moderate pain (pain score 4-6). No more than 6 tablets per day.   rosuvastatin 40 MG tablet Commonly known as: CRESTOR Take 40 mg by mouth at bedtime.   traZODone 100 MG tablet Commonly known as: DESYREL Take 100 mg by mouth at bedtime.               Durable Medical Equipment  (From admission, onward)           Start     Ordered   06/12/22 1316  DME 3 n 1  Once        06/12/22 1315   06/12/22 1316  DME Walker rolling  Once       Question Answer Comment  Walker: With 5 Inch Wheels   Patient needs a walker to treat with the following condition Status post total right knee replacement      06/12/22 1315            Diagnostic Studies: DG Knee Right Port  Result Date: 06/12/2022 CLINICAL DATA:  Status post right knee replacement. EXAM: PORTABLE RIGHT KNEE - 1-2 VIEW COMPARISON:   None Available. FINDINGS: Right knee arthroplasty in expected alignment. No periprosthetic lucency or fracture. There has been patellar resurfacing. Recent postsurgical change includes air and edema in the soft tissues and joint space. Anterior skin staples in place. IMPRESSION: Right knee arthroplasty without immediate postoperative complication. Electronically Signed   By: Aurther Loft.D.  On: 06/12/2022 12:38    Disposition: Discharge disposition: 01-Home or [place] [place] Follow up.   Specialty: [place] Why: to provide home physical therapy visits Contact information: [address] [place] [address] [phone]         [name], MD Follow up in 2 week(s).   Specialty: Orthopedic Surgery Contact information: [address] [place] [address] [phone]                  Signed: [name] 06/13/2022, 12:14 PM

## 2022-06-13 NOTE — Progress Notes (Signed)
Physical Therapy Treatment Patient Details Name: Nicolas Berry MRN: [id number] DOB: 12/02/1945 Today's Date: 06/13/2022   History of Present Illness Pt is a 76yo male presenting s/p R-TKA on 06/12/22. PMH: anemia, afib, BPH, COPD, CAD, DM, GERD, HLD HTN, cervical surgery.    PT Comments    POD # 1 pm session Assisted with amb an increased distance in hallway and practicing stairs.  Asisted back to bed toi complete TE's following HEP handout. Addressed all mobility questions, discussed appropriate activity, educated on use of ICE.  Pt ready for D/C to home.   Recommendations for follow up therapy are one component of a multi-disciplinary discharge planning process, led by the attending physician.  Recommendations may be updated based on patient status, additional functional criteria and insurance authorization.  Follow Up Recommendations  Follow physician's recommendations for discharge plan and follow up therapies     Assistance Recommended at Discharge Set up Supervision/Assistance  Patient can return home with the following A little help with walking and/or transfers;A little help with bathing/dressing/bathroom;Assistance with cooking/housework;Assist for transportation;Help with stairs or ramp for entrance   Equipment Recommendations  Rolling walker (2 wheels)    Recommendations for Other Services       Precautions / Restrictions Precautions Precautions: Fall;Knee Precaution Comments: No pillow under the knee Restrictions Weight Bearing Restrictions: No Other Position/Activity Restrictions: WBAT     Mobility  Bed Mobility Overal bed mobility: Needs Assistance Bed Mobility: Sit to Supine           General bed mobility comments: assisted back to bed demonstarted and educated on use of belt to self assist.    Transfers Overall transfer level: Needs assistance Equipment used: Rolling walker (2 wheels) Transfers: Sit to/from Stand Sit to Stand: Supervision    Step pivot transfers: Supervision, Min guard       General transfer comment: 25% VC's on proper hand placement as well as increased time to "power up" as well as stand to sit "control landing".    Ambulation/Gait Ambulation/Gait assistance: Supervision, Min guard Gait Distance (Feet): 84 Feet Assistive device: Rolling walker (2 wheels) Gait Pattern/deviations: Step-to pattern, Decreased stance time - right Gait velocity: decreased     General Gait Details: 25% VC's on proper sequencing, proper walker to self distance as well as safety with turns.  Pt tolerated an increased functional distance.   Stairs Stairs: Yes Stairs assistance: Min assist Stair Management: No rails, Step to pattern, Forwards, With walker Number of Stairs: 2 General stair comments: practiced up forward with walker )no rails) with 50% VC's on proper walker placement as well as required second asisst needed to secure walker.  Pt stated his Son will help him.   Wheelchair Mobility    Modified Rankin (Stroke Patients Only)       Balance                                            Cognition Arousal/Alertness: Awake/alert Behavior During Therapy: WFL for tasks assessed/performed Overall Cognitive Status: Within Functional Limits for tasks assessed                                 General Comments: AxO x 3 very pleasant and motivated to "get home today".        Exercises  Total Knee Replacement TE's  following HEP handout 10 reps B LE ankle pumps 05 reps towel squeezes 05 reps knee presses 05 reps heel slides  05 reps SAQ's 05 reps SLR's 05 reps ABD Educated on use of gait belt to assist with TE's Followed by ICE     General Comments        Pertinent Vitals/Pain Pain Assessment Pain Assessment: 0-10 Pain Score: 4  Pain Location: RIGHT KNEE Pain Descriptors / Indicators: Burning, Operative site guarding Pain Intervention(s): Monitored during session,  Premedicated before session, Repositioned, Ice applied    Home Living                          Prior Function            PT Goals (current goals can now be found in the care plan section) Progress towards PT goals: Progressing toward goals    Frequency    7X/week      PT Plan Current plan remains appropriate    Co-evaluation              AM-PAC PT "6 Clicks" Mobility   Outcome Measure  Help needed turning from your back to your side while in a flat bed without using bedrails?: A Little Help needed moving from lying on your back to sitting on the side of a flat bed without using bedrails?: A Little Help needed moving to and from a bed to a chair (including a wheelchair)?: A Little Help needed standing up from a chair using your arms (e.g., wheelchair or bedside chair)?: A Little Help needed to walk in hospital room?: A Little Help needed climbing 3-5 steps with a railing? : A Lot 6 Click Score: 17    End of Session Equipment Utilized During Treatment: Gait belt Activity Tolerance: Patient tolerated treatment well;No increased pain Patient left: in bed Nurse Communication: Mobility status PT Visit Diagnosis: Pain;Difficulty in walking, not elsewhere classified (R26.2) Pain - Right/Left: Right Pain - part of body: Knee     Time: 1336-1400 PT Time Calculation (min) (ACUTE ONLY): 24 min  Charges:  $Gait Training: 8-22 mins $Therapeutic Exercise: 8-22 mins                     Rica Koyanagi  PTA [facility] M-F          [phone] Weekend pager [phone]

## 2022-06-13 NOTE — Progress Notes (Signed)
Physical Therapy Treatment Patient Details Name: Nicolas Berry MRN: [id number] DOB: 11/02/1945 Today's Date: 06/13/2022   History of Present Illness Pt is a 75yo male presenting s/p R-TKA on 06/12/22. PMH: anemia, afib, BPH, COPD, CAD, DM, GERD, HLD HTN, cervical surgery.    PT Comments    POD # 1 am session General Comments: AxO x 3 very pleasant and motivated to "get home today". Pt already OOB in recliner.  Assisted with amb to bathroom as well as staic standing at toilet to void/wash hands.  Assisted with amb in hallway.  General Gait Details: 50% VC's on proper sequencing, proper walker to self distance as well as safety with turns. Then returned to room to perform some TE's following HEP handout.  Instructed on proper tech, freq as well as use of ICE.   Pt will need another PT session to address stairs and complete HEP Education.   Recommendations for follow up therapy are one component of a multi-disciplinary discharge planning process, led by the attending physician.  Recommendations may be updated based on patient status, additional functional criteria and insurance authorization.  Follow Up Recommendations  Follow physician's recommendations for discharge plan and follow up therapies     Assistance Recommended at Discharge Set up Supervision/Assistance  Patient can return home with the following A little help with walking and/or transfers;A little help with bathing/dressing/bathroom;Assistance with cooking/housework;Assist for transportation;Help with stairs or ramp for entrance   Equipment Recommendations  Rolling walker (2 wheels)    Recommendations for Other Services       Precautions / Restrictions Precautions Precautions: Fall;Knee Precaution Comments: No pillow under the knee Restrictions Weight Bearing Restrictions: No Other Position/Activity Restrictions: WBAT     Mobility  Bed Mobility               General bed mobility comments: OOB in recliner     Transfers Overall transfer level: Needs assistance Equipment used: Rolling walker (2 wheels) Transfers: Sit to/from Stand Sit to Stand: Supervision           General transfer comment: 50% VC's on proper hand placement as well as increased time to "power up" as well as stand to sit "control landing".    Ambulation/Gait Ambulation/Gait assistance: Supervision, Min guard Gait Distance (Feet): 65 Feet Assistive device: Rolling walker (2 wheels) Gait Pattern/deviations: Step-to pattern, Decreased stance time - right Gait velocity: decreased     General Gait Details: 50% VC's on proper sequencing, proper walker to self distance as well as safety with turns.   Stairs             Wheelchair Mobility    Modified Rankin (Stroke Patients Only)       Balance                                            Cognition Arousal/Alertness: Awake/alert Behavior During Therapy: WFL for tasks assessed/performed Overall Cognitive Status: Within Functional Limits for tasks assessed                                 General Comments: AxO x 3 very pleasant and motivated to "get home today".        Exercises   10 resp AP and knee presses   General Comments        Pertinent Vitals/Pain  Pain Assessment Pain Assessment: 0-10 Pain Score: 4  Pain Location: RIGHT KNEE Pain Descriptors / Indicators: Burning, Operative site guarding Pain Intervention(s): Monitored during session, Premedicated before session, Repositioned, Ice applied    Home Living                          Prior Function            PT Goals (current goals can now be found in the care plan section) Progress towards PT goals: Progressing toward goals    Frequency    7X/week      PT Plan Current plan remains appropriate    Co-evaluation              AM-PAC PT "6 Clicks" Mobility   Outcome Measure  Help needed turning from your back to your side while  in a flat bed without using bedrails?: A Little Help needed moving from lying on your back to sitting on the side of a flat bed without using bedrails?: A Little Help needed moving to and from a bed to a chair (including a wheelchair)?: A Little Help needed standing up from a chair using your arms (e.g., wheelchair or bedside chair)?: A Little Help needed to walk in hospital room?: A Little Help needed climbing 3-5 steps with a railing? : A Lot 6 Click Score: 17    End of Session Equipment Utilized During Treatment: Gait belt Activity Tolerance: Patient tolerated treatment well;No increased pain Patient left: in chair;with call bell/phone within reach;with chair alarm set Nurse Communication: Mobility status PT Visit Diagnosis: Pain;Difficulty in walking, not elsewhere classified (R26.2) Pain - Right/Left: Right Pain - part of body: Knee     Time: 4128-7867 PT Time Calculation (min) (ACUTE ONLY): 24 min  Charges:  $Gait Training: 8-22 mins $Therapeutic Exercise: 8-22 mins                     Rica Koyanagi  PTA [facility] M-F          [phone] Weekend pager [phone]

## 2022-06-13 NOTE — Progress Notes (Signed)
Subjective: 1 Day Post-Op Procedure(s) (LRB): RIGHT TOTAL KNEE ARTHROPLASTY (Right) Patient reports pain as moderate.    Objective: Vital signs in last 24 hours: Temp:  [97.5 F (36.4 C)-98 F (36.7 C)] 97.5 F (36.4 C) (09/23 1002) Pulse Rate:  [52-66] 60 (09/23 1002) Resp:  [12-18] 17 (09/23 1002) BP: (122-159)/(67-90) 122/70 (09/23 1002) SpO2:  [72 %-100 %] 98 % (09/23 1002)  Intake/Output from previous day: 09/22 0701 - 09/23 0700 In: 2218.7 [P.O.:840; I.V.:1000; IV Piggyback:378.7] Out: 2725 [Urine:2625; Blood:100] Intake/Output this shift: Total I/O In: 240 [P.O.:240] Out: -   Recent Labs    06/13/22 0334  HGB 11.0*   Recent Labs    06/13/22 0334  WBC 12.9*  RBC 3.33*  HCT 34.7*  PLT 127*   Recent Labs    06/13/22 0334  NA 138  K 3.9  CL 106  CO2 24  BUN 23  CREATININE 1.48*  GLUCOSE 137*  CALCIUM 9.0   No results for input(s): "LABPT", "INR" in the last 72 hours.  Sensation intact distally Intact pulses distally Dorsiflexion/Plantar flexion intact Incision: dressing C/D/I Compartment soft   Assessment/Plan: 1 Day Post-Op Procedure(s) (LRB): RIGHT TOTAL KNEE ARTHROPLASTY (Right) Up with therapy Discharge home with home health      [name] 06/13/2022, 12:12 PM

## 2022-06-13 NOTE — Discharge Instructions (Signed)

## 2022-06-13 NOTE — Plan of Care (Signed)
  Problem: Education: Goal: Knowledge of the prescribed therapeutic regimen will improve Outcome: Progressing   Problem: Activity: Goal: Ability to avoid complications of mobility impairment will improve Outcome: Progressing   Problem: Pain Management: Goal: Pain level will decrease with appropriate interventions Outcome: Progressing   

## 2022-06-15 ENCOUNTER — Telehealth: Payer: Self-pay | Admitting: Orthopaedic Surgery

## 2022-06-15 ENCOUNTER — Telehealth: Payer: Self-pay | Admitting: Cardiology

## 2022-06-15 ENCOUNTER — Telehealth: Payer: Self-pay

## 2022-06-15 NOTE — Telephone Encounter (Signed)
[email or web address]

## 2022-06-15 NOTE — Telephone Encounter (Signed)
Please advise 

## 2022-06-15 NOTE — Telephone Encounter (Signed)
Patient has questions about med he is taking after his surgery. The best number to reach the pt [phone]

## 2022-06-15 NOTE — Telephone Encounter (Signed)
See other message

## 2022-06-15 NOTE — Telephone Encounter (Signed)
Pt c/o medication issue:  1. Name of Medication:  apixaban (ELIQUIS) 5 MG TABS tablet  2. How are you currently taking this medication (dosage and times per day)?  Twice daily by mouth, as prescribed  3. Are you having a reaction (difficulty breathing--STAT)?  See below  4. What is your medication issue?   Patient states he started back on Eliquis on Saturday morning, 9/23. Yesterday he developed blood in his urine and he noticed some today as well. Denies pain.

## 2022-06-15 NOTE — Telephone Encounter (Signed)
Called and advised patient and daughter. They stated understanding

## 2022-06-15 NOTE — Telephone Encounter (Signed)
Pt's wife called and states he is s/p a right total knee and restarted his Eliquis and is having blood in his urine. I advised to call his managing doctor to let them know and she also said that he is having some blood drainage through is dressing ( but that it looked this way when he left the hospital) and that the pt states his knee looks a little pink. Cb # [phone] or [phone]

## 2022-06-16 NOTE — Telephone Encounter (Signed)
Patient made aware. Verbalized understanding.

## 2022-06-16 NOTE — Telephone Encounter (Signed)
Can hold eliquis 2 days and if bleeding resolves restart. If ongoing bleeding would need to address with pcp   Zandra Abts MD

## 2022-06-16 NOTE — Telephone Encounter (Signed)
Wife called stating that patient had a total knee replacement on 9/22 and was released to come home on 9/23. States that Eliquis was stopped either on 9/15 or 9/18 and restarted on Sunday, 9/24. States that she noticed on late Sunday, early morning that he when he would urinate, there would be a lot of blood. States that the blood was red yesterday and more brown today. Denies having any pain, dizziness or SOB. States that she knows the patient had to wear a catheter during surgery but unsure if that has anything to do with him bleeding. Please advise.

## 2022-06-25 ENCOUNTER — Encounter: Payer: Self-pay | Admitting: Orthopaedic Surgery

## 2022-06-25 ENCOUNTER — Telehealth: Payer: Self-pay | Admitting: Cardiology

## 2022-06-25 ENCOUNTER — Ambulatory Visit (INDEPENDENT_AMBULATORY_CARE_PROVIDER_SITE_OTHER): Payer: No Typology Code available for payment source | Admitting: Orthopaedic Surgery

## 2022-06-25 DIAGNOSIS — Z96651 Presence of right artificial knee joint: Secondary | ICD-10-CM

## 2022-06-25 MED ORDER — OXYCODONE HCL 5 MG PO TABS: 5.0000 mg | ORAL_TABLET | Freq: Four times a day (QID) | ORAL | 0 refills | Status: DC | PRN

## 2022-06-25 NOTE — Telephone Encounter (Signed)
Patient states he was returning call. Please advise ?

## 2022-06-25 NOTE — Progress Notes (Signed)
The patient is here today at 2 weeks status post a right total knee arthroplasty.  He is on Eliquis chronically.  He is doing well overall.  He is ambulating with a rolling walker.  His notes from therapy stated they have been able to flex him at home with home health PT to 80 degrees.  He would like to continue outpatient physical therapy with [facility] physical therapy in [place].  He does need a refill on pain medication.  On examination his right operative knee is swollen to be expected.  He will probably have more swelling and slight bleeding given his chronic Eliquis needs.  I did remove the staples and place Steri-Strips.  His calf is soft.  He has flexion to about 80 degrees and almost full extension with that right knee.  He will transition outpatient physical therapy.  I will refill his oxycodone and will continue to do this through his for several weeks of therapy.  I would like to see him back in 4 weeks for repeat exam but no x-rays are needed.

## 2022-06-26 NOTE — Telephone Encounter (Signed)
Informed patient that we did not recently call him - may have been an old message - several in epic from knee doctor.  He verbalized understanding.

## 2022-06-30 ENCOUNTER — Other Ambulatory Visit: Payer: Self-pay | Admitting: Orthopaedic Surgery

## 2022-06-30 ENCOUNTER — Telehealth: Payer: Self-pay | Admitting: Orthopaedic Surgery

## 2022-06-30 MED ORDER — OXYCODONE HCL 5 MG PO TABS: 5.0000 mg | ORAL_TABLET | Freq: Four times a day (QID) | ORAL | 0 refills | Status: DC | PRN

## 2022-06-30 NOTE — Telephone Encounter (Signed)
Please advise 

## 2022-06-30 NOTE — Telephone Encounter (Signed)
Pt needs oxycodone '5mg'$  refilled [facility]

## 2022-07-08 ENCOUNTER — Other Ambulatory Visit: Payer: Self-pay | Admitting: Orthopaedic Surgery

## 2022-07-08 ENCOUNTER — Telehealth: Payer: Self-pay | Admitting: Orthopaedic Surgery

## 2022-07-08 MED ORDER — OXYCODONE HCL 5 MG PO TABS: 5.0000 mg | ORAL_TABLET | Freq: Four times a day (QID) | ORAL | 0 refills | Status: DC | PRN

## 2022-07-08 NOTE — Telephone Encounter (Signed)
Patient called. Would like a refill on oxycodone. His call back number is [phone]

## 2022-07-14 ENCOUNTER — Institutional Professional Consult (permissible substitution): Payer: No Typology Code available for payment source | Admitting: Cardiology

## 2022-07-23 ENCOUNTER — Encounter: Payer: Self-pay | Admitting: Orthopaedic Surgery

## 2022-07-23 ENCOUNTER — Ambulatory Visit (INDEPENDENT_AMBULATORY_CARE_PROVIDER_SITE_OTHER): Payer: No Typology Code available for payment source | Admitting: Orthopaedic Surgery

## 2022-07-23 DIAGNOSIS — Z96651 Presence of right artificial knee joint: Secondary | ICD-10-CM

## 2022-07-23 MED ORDER — OXYCODONE HCL 5 MG PO TABS: 5.0000 mg | ORAL_TABLET | Freq: Three times a day (TID) | ORAL | 0 refills | Status: DC | PRN

## 2022-07-23 NOTE — Progress Notes (Signed)
The patient is now 6 weeks status post a right total knee arthroplasty.  He is 76 years old and very active.  He has notes from physical therapy.  They have been able to flex him to about 98 degrees and he still lacks full extension by 3 to 5 degrees.  He is ambulating with a cane.  He is taking oxycodone but using it sparingly.  He does need 1 more refill to get him through therapy.  On my exam today he does still have swelling of the right knee to be expected at 6 weeks postoperative.  It feels like this is stable.  I can flex him to about 95 degrees and he lacks full extension by about 3 degrees.  I felt it was best to try at least 1 steroid injection in his right knee today is to see if this will allow him to push through the motion with therapy.  The physical therapist feel like he is close to getting over the edge/stump.  He agreed to this and tolerated steroid injection well.  I would like to see him back in 4 weeks for repeat exam but no x-rays are needed.  I will send in some more oxycodone for him.

## 2022-07-31 DIAGNOSIS — H5032 Intermittent alternating esotropia: Secondary | ICD-10-CM | POA: Diagnosis not present

## 2022-07-31 DIAGNOSIS — H532 Diplopia: Secondary | ICD-10-CM | POA: Diagnosis not present

## 2022-07-31 DIAGNOSIS — H538 Other visual disturbances: Secondary | ICD-10-CM | POA: Diagnosis not present

## 2022-07-31 DIAGNOSIS — H5089 Other specified strabismus: Secondary | ICD-10-CM | POA: Diagnosis not present

## 2022-08-03 ENCOUNTER — Other Ambulatory Visit: Payer: Self-pay | Admitting: Orthopaedic Surgery

## 2022-08-03 ENCOUNTER — Telehealth: Payer: Self-pay | Admitting: Orthopaedic Surgery

## 2022-08-03 MED ORDER — OXYCODONE HCL 5 MG PO TABS: 5.0000 mg | ORAL_TABLET | Freq: Three times a day (TID) | ORAL | 0 refills | Status: DC | PRN

## 2022-08-03 NOTE — Telephone Encounter (Signed)
Pt called requesting a refill of oxycoodne. Please send to [facility]. [place]. Pt phone n umber is [phone].

## 2022-08-17 ENCOUNTER — Telehealth: Payer: Self-pay

## 2022-08-17 ENCOUNTER — Telehealth: Payer: Self-pay | Admitting: Orthopaedic Surgery

## 2022-08-17 ENCOUNTER — Other Ambulatory Visit: Payer: Self-pay | Admitting: Orthopaedic Surgery

## 2022-08-17 ENCOUNTER — Telehealth: Payer: Self-pay | Admitting: *Deleted

## 2022-08-17 MED ORDER — OXYCODONE HCL 5 MG PO TABS: 5.0000 mg | ORAL_TABLET | Freq: Three times a day (TID) | ORAL | 0 refills | Status: DC | PRN

## 2022-08-17 NOTE — Telephone Encounter (Signed)
Patient with diagnosis of afib on Eliquis for anticoagulation.    Procedure: STRABISMUS EYE SURGERY  Date of procedure: 08/21/22  CHA2DS2-VASc Score = 5  This indicates a 7.2% annual risk of stroke. The patient's score is based upon: CHF History: 0 HTN History: 1 Diabetes History: 1 Stroke History: 0 Vascular Disease History: 1 Age Score: 2 Gender Score: 0   CrCl 21m/min Platelet count 127K  Per office protocol, patient can hold Eliquis for 1-2 days prior to procedure.    **This guidance is not considered finalized until pre-operative APP has relayed final recommendations.**

## 2022-08-17 NOTE — Telephone Encounter (Signed)
I s/w the pt and he is agreeable to plan of care for tele pre op appt add on due to procedure date and med hold. 08/18/22 @ 10:40. Pt advised he will take his Eliquis tomorrow 08/18/22 and will hold 1-2 days prior which the pre op provider will review with him. Med rec and consent are done. Pt thanked me for the call and the help.    Patient Consent for Virtual Visit        Chau [name] has provided verbal consent on 08/17/2022 for a virtual visit (video or telephone).   CONSENT FOR VIRTUAL VISIT FOR:  Nicolas Berry  By participating in this virtual visit I agree to the following:  I hereby voluntarily request, consent and authorize [facility] and its employed or contracted physicians, physician assistants, nurse practitioners or other licensed health care professionals (the Practitioner), to provide me with telemedicine health care services (the "Services") as deemed necessary by the treating Practitioner. I acknowledge and consent to receive the Services by the Practitioner via telemedicine. I understand that the telemedicine visit will involve communicating with the Practitioner through live audiovisual communication technology and the disclosure of certain medical information by electronic transmission. I acknowledge that I have been given the opportunity to request an in-person assessment or other available alternative prior to the telemedicine visit and am voluntarily participating in the telemedicine visit.  I understand that I have the right to withhold or withdraw my consent to the use of telemedicine in the course of my care at any time, without affecting my right to future care or treatment, and that the Practitioner or I may terminate the telemedicine visit at any time. I understand that I have the right to inspect all information obtained and/or recorded in the course of the telemedicine visit and may receive copies of available information for a reasonable fee.  I  understand that some of the potential risks of receiving the Services via telemedicine include:  Delay or interruption in medical evaluation due to technological equipment failure or disruption; Information transmitted may not be sufficient (e.g. poor resolution of images) to allow for appropriate medical decision making by the Practitioner; and/or  In rare instances, security protocols could fail, causing a breach of personal health information.  Furthermore, I acknowledge that it is my responsibility to provide information about my medical history, conditions and care that is complete and accurate to the best of my ability. I acknowledge that Practitioner's advice, recommendations, and/or decision may be based on factors not within their control, such as incomplete or inaccurate data provided by me or distortions of diagnostic images or specimens that may result from electronic transmissions. I understand that the practice of medicine is not an exact science and that Practitioner makes no warranties or guarantees regarding treatment outcomes. I acknowledge that a copy of this consent can be made available to me via my patient portal ([facility]), or I can request a printed copy by calling the office of [facility].    I understand that my insurance will be billed for this visit.   I have read or had this consent read to me. I understand the contents of this consent, which adequately explains the benefits and risks of the Services being provided via telemedicine.  I have been provided ample opportunity to ask questions regarding this consent and the Services and have had my questions answered to my satisfaction. I give my informed consent for the services to be provided through  the use of telemedicine in my medical care

## 2022-08-17 NOTE — Telephone Encounter (Signed)
   Name: Nicolas Berry  DOB: Oct 22, 1945  MRN: [id number]  Primary Cardiologist: Carlyle Dolly, MD - last OV 04/2022 doing well   Preoperative team, please contact this patient and set up a phone call appointment for further preoperative risk assessment. Please obtain consent and complete medication review. Thank you for your help. Given time sensitive nature of clearance, will also concomitantly route to pharm to help weigh in on anticoagulation even though we usually wait to obtain before routing to callback - just want to ensure he gets on virtual schedule early this week.   Charlie Pitter, PA-C 08/17/2022, 4:12 PM [place]

## 2022-08-17 NOTE — Telephone Encounter (Signed)
..     Pre-operative Risk Assessment    Patient Name: Nicolas Berry  DOB: 10/12/1945 MRN: [id number]      Request for Surgical Clearance    Procedure:   STRABISMUS EYE SURGERY  Date of Surgery:  Clearance 08/21/22                                 Surgeon:  DR GRACE [facility] Surgeon's Group or Practice Name:  [facility] [place] Phone number:  [phone] Fax number:  [phone]   Type of Clearance Requested:   - Medical  - Pharmacy:  Hold Apixaban (Eliquis)     Type of Anesthesia:  Not Indicated   Additional requests/questions:    Gwenlyn Found   08/17/2022, 2:57 PM

## 2022-08-17 NOTE — Telephone Encounter (Signed)
Routed to callback as noted below. Will remove from preop pool, pending virtual visit appt.

## 2022-08-17 NOTE — Telephone Encounter (Signed)
Patient advised he needs his Oxycodone called in and also call him and advise when it is done..[phone]

## 2022-08-17 NOTE — Telephone Encounter (Signed)
I s/w the pt and he is agreeable to plan of care for tele pre op appt add on due to procedure date and med hold. 08/18/22 @ 10:40. Pt advised he will take his Eliquis tomorrow 08/18/22 and will hold 1-2 days prior which the pre op provider will review with him. Med rec and consent are done. Pt thanked me for the call and the help.

## 2022-08-17 NOTE — Telephone Encounter (Signed)
Refill on pain meds

## 2022-08-18 ENCOUNTER — Encounter: Payer: Self-pay | Admitting: Student

## 2022-08-18 ENCOUNTER — Telehealth: Payer: Self-pay | Admitting: Cardiology

## 2022-08-18 ENCOUNTER — Ambulatory Visit: Payer: No Typology Code available for payment source | Attending: Cardiology | Admitting: Student

## 2022-08-18 DIAGNOSIS — Z01810 Encounter for preprocedural cardiovascular examination: Secondary | ICD-10-CM

## 2022-08-18 NOTE — Telephone Encounter (Signed)
See clearance notes from Mayra Reel, NP.

## 2022-08-18 NOTE — Progress Notes (Signed)
Virtual Visit via Telephone Note   Because of Nicolas Berry co-morbid illnesses, he is at least at moderate risk for complications without adequate follow up.  This format is felt to be most appropriate for this patient at this time.  The patient did not have access to video technology/had technical difficulties with video requiring transitioning to audio format only (telephone).  All issues noted in this document were discussed and addressed.  No physical exam could be performed with this format.  Please refer to the patient's chart for his consent to telehealth for Nicolas Berry.  Evaluation Performed:  Preoperative cardiovascular risk assessment _____________   Date:  08/18/2022   Patient ID:  Nicolas Berry, DOB 1946/08/02, MRN [id number] Patient Location:  Home Provider location:   Office  Primary Care Provider:  Anthoney Harada, NP Primary Cardiologist:  Carlyle Dolly, MD  Chief Complaint / Patient Profile   75 y.o. y/o male with a h/o PAF on Eliquis, CAD, and HTN who is pending strabismus eye surgery and presents today for telephonic preoperative cardiovascular risk assessment.  Past Medical History    Past Medical History:  Diagnosis Date   Anemia    Anxiety    OA multiple joints   Arthritis    Atrial fibrillation (HCC)    BPH (benign prostatic hyperplasia)    COPD (chronic obstructive pulmonary disease) ([place])    uses inhaler   Coronary artery disease    DDD (degenerative disc disease), lumbar    Diabetes mellitus without complication ([place])    Dyspnea    Fatty liver    GERD (gastroesophageal reflux disease)    Headache    Hyperlipidemia    Hypertension    Past Surgical History:  Procedure Laterality Date   BACK SURGERY     C6-C7   BALLOON DILATION N/A 10/01/2020   Procedure: BALLOON DILATION;  Surgeon: Eloise Harman, DO;  [facility];  Service: Endoscopy;  Laterality: N/A;   BIOPSY  10/01/2020   Procedure: BIOPSY;  Surgeon:  Eloise Harman, DO;  [facility]: [facility];  Service: Endoscopy;;  gastric   COLONOSCOPY WITH PROPOFOL N/A 10/01/2020   Procedure: COLONOSCOPY WITH PROPOFOL;  Surgeon: Eloise Harman, DO;  [facility]: [facility];  Service: Endoscopy;  Laterality: N/A;  2:15pm, pt know new time per office   ESOPHAGOGASTRODUODENOSCOPY (EGD) WITH PROPOFOL N/A 10/01/2020   Procedure: ESOPHAGOGASTRODUODENOSCOPY (EGD) WITH PROPOFOL;  Surgeon: Eloise Harman, DO;  [facility];  Service: Endoscopy;  Laterality: N/A;   HERNIA REPAIR     left inguinal   POLYPECTOMY  10/01/2020   Procedure: POLYPECTOMY INTESTINAL;  Surgeon: Eloise Harman, DO;  [facility]: [facility];  Service: Endoscopy;;   STRABISMUS SURGERY Left 10/25/2020   Procedure: STRABISMUS REPAIR LEFT EYE;  Surgeon: Everitt Amber, MD;  Location: [facility];  Service: Ophthalmology;  Laterality: Left;   TONSILLECTOMY     TOTAL KNEE ARTHROPLASTY Right 06/12/2022   Procedure: RIGHT TOTAL KNEE ARTHROPLASTY;  Surgeon: [name], MD;  Location: [facility];  Service: Orthopedics;  Laterality: Right;    Allergies  No Known Allergies  History of Present Illness    Nicolas Berry is a 76 y.o. male who presents via audio/video conferencing for a telehealth visit today.  Pt was last seen in cardiology clinic on 05/15/2022 by Dr. Harl Bowie.  At that time Nicolas Berry was doing well.  The patient is now pending procedure as outlined above. Since his last  visit, he is doing well. He had knee replacement surgery, which has slowed him down somewhat. He reports activity equivalent to 4.0 METS to include playing nine holes of golf (walking and riding in the golf cart), participating in physical therapy twice a week, and grocery shopping.    Home Medications    Prior to Admission medications   Medication Sig Start Date End Date Taking? Authorizing Provider  albuterol (VENTOLIN HFA) 108 (90 Base) MCG/ACT inhaler Inhale 2 puffs  into the lungs every 4 (four) hours as needed for wheezing or shortness of breath.    [provider]  apixaban (ELIQUIS) 5 MG TABS tablet Take 1 tablet (5 mg total) by mouth 2 (two) times daily. 03/27/22   Arnoldo Lenis, MD  carvedilol (COREG) 3.125 MG tablet TAKE 1 TABLET BY MOUTH TWICE DAILY STOPPING  CARDIZEM 02/03/21   Arnoldo Lenis, MD  Cholecalciferol 25 MCG (1000 UT) tablet Take 1,000 Units by mouth daily.    [provider]  dapagliflozin propanediol (FARXIGA) 5 MG TABS tablet Take 5 mg by mouth daily.    [provider]  DULoxetine (CYMBALTA) 60 MG capsule Take 60 mg by mouth every evening.    [provider]  famotidine (PEPCID) 20 MG tablet Take 20 mg by mouth daily.    [provider]  ferrous sulfate 325 (65 FE) MG tablet Take 325 mg by mouth daily with breakfast.    [provider]  finasteride (PROSCAR) 5 MG tablet Take 5 mg by mouth daily.    [provider]  fluticasone (FLONASE) 50 MCG/ACT nasal spray Place 1 spray into both nostrils daily as needed for allergies or rhinitis.    [provider]  Fluticasone-Salmeterol (ADVAIR) 250-50 MCG/DOSE AEPB Inhale 1 puff into the lungs 2 (two) times daily as needed (shortness of breath).    [provider]  gabapentin (NEURONTIN) 300 MG capsule Take 300 mg by mouth 3 (three) times daily. 01/05/18   [provider]  ibuprofen (ADVIL) 200 MG tablet Take 800 mg by mouth every 6 (six) hours as needed.    [provider]  losartan (COZAAR) 25 MG tablet Take 2 tablets (50 mg total) by mouth every morning AND 1 tablet (25 mg total) every evening. Patient taking differently: Take 25 mg by mouth daily  08/04/21   Arnoldo Lenis, MD  Menthol, Topical Analgesic, (BIOFREEZE EX) Apply 1 Application topically at bedtime.    [provider]  metFORMIN (GLUCOPHAGE) 500 MG tablet Take 500 mg by mouth 2 (two) times daily with a meal.     [provider]  methocarbamol (ROBAXIN) 500 MG tablet Take 1 tablet (500 mg total) by mouth every 6 (six) hours as needed for muscle spasms. 06/13/22   [name], MD  oxyCODONE (OXY IR/ROXICODONE) 5 MG immediate release tablet Take 1 tablet (5 mg total) by mouth 3 (three) times daily as needed for moderate pain (pain score 4-6). No more than 6 tablets per day. 08/17/22   [name], MD  rosuvastatin (CRESTOR) 40 MG tablet Take 40 mg by mouth at bedtime.    [provider]  traZODone (DESYREL) 100 MG tablet Take 100 mg by mouth at bedtime.    [provider]    Physical Exam    Vital Signs:  Chaunce Winkels does not have vital signs available for review today.  Given telephonic nature of communication, physical exam is limited. AAOx3. NAD. Normal affect.  Speech and  respirations are unlabored.  Accessory Clinical Findings    None  Assessment & Plan    1.  Preoperative Cardiovascular Risk Assessment.  Patient does not have a history of ischemic heart disease, PCI, congestive heart failure, or stroke. He reports activity equivalent to 4.0 METS (golfing, physical therapy, grocery shopping). He denies cardiac symptoms.   According to the RCRI, he has a 0.4% risk of MACE. Therefore, the patient would be at acceptable risk for the planned procedure without further cardiovascular testing.     The patient was advised that if he develops new symptoms prior to surgery to contact our office to arrange for a follow-up visit, and he verbalized understanding.  2. PAF on Eliquis.   Per PharmD: Procedure: STRABISMUS EYE SURGERY  Date of procedure: 08/21/22   CHA2DS2-VASc Score = 5  This indicates a 7.2% annual risk of stroke. The patient's score is based upon: CHF History: 0 HTN History: 1 Diabetes History: 1 Stroke History: 0 Vascular Disease History: 1 Age Score: 2 Gender Score: 0   CrCl 41m/min Platelet count 127K   Per office  protocol, patient can hold Eliquis for 1-2 days prior to procedure.    A copy of this note will be routed to requesting surgeon.  Time:   Today, I have spent 6 minutes with the patient with telehealth technology discussing medical history, symptoms, and management plan.     DMayra Reel NP  08/18/2022, 1:20 PM

## 2022-08-18 NOTE — Telephone Encounter (Signed)
Pt would like a callback regarding missed appt this morning. Please advise

## 2022-08-18 NOTE — Telephone Encounter (Signed)
Could not reach patient for scheduled virtual visit. Can you please reach out and see if we can reschedule his virtual visit for clearnce? Thanks so much Angie

## 2022-08-18 NOTE — Telephone Encounter (Signed)
Pre op APP has tried a number of times to reach the pt for his tele pre op appt, though unable to reach the pt. I then tried to call the pt as well and left a message to call back and ask to s/w pre op team. Pt's procedure is 08/21/22.

## 2022-08-18 NOTE — Progress Notes (Deleted)
Virtual Visit via Telephone Note   Because of Nicolas Berry co-morbid illnesses, he is at least at moderate risk for complications without adequate follow up.  This format is felt to be most appropriate for this patient at this time.  The patient did not have access to video technology/had technical difficulties with video requiring transitioning to audio format only (telephone).  All issues noted in this document were discussed and addressed.  No physical exam could be performed with this format.  Please refer to the patient's chart for his consent to telehealth for [facility].  Evaluation Performed:  Preoperative cardiovascular risk assessment _____________   Date:  08/18/2022   Patient ID:  Nicolas Berry, DOB [birth year], MRN [id number] Patient Location:  Home Provider location:   Office  Primary Care Provider:  Anthoney Harada, NP Primary Cardiologist:  Carlyle Dolly, MD  Chief Complaint / Patient Profile   76 y.o. y/o male with a h/o PAF not on Eliquis, CAD, HTN who is pending STRABISMUS EYE SURGERY  and presents today for telephonic preoperative cardiovascular risk assessment.  Past Medical History    Past Medical History:  Diagnosis Date   Anemia    Anxiety    OA multiple joints   Arthritis    Atrial fibrillation (HCC)    BPH (benign prostatic hyperplasia)    COPD (chronic obstructive pulmonary disease) ([place])    uses inhaler   Coronary artery disease    DDD (degenerative disc disease), lumbar    Diabetes mellitus without complication ([place])    Dyspnea    Fatty liver    GERD (gastroesophageal reflux disease)    Headache    Hyperlipidemia    Hypertension    Past Surgical History:  Procedure Laterality Date   BACK SURGERY     C6-C7   BALLOON DILATION N/A 10/01/2020   Procedure: BALLOON DILATION;  Surgeon: Eloise Harman, DO;  [facility];  Service: Endoscopy;  Laterality: N/A;   BIOPSY  10/01/2020   Procedure: BIOPSY;   Surgeon: Eloise Harman, DO;  [facility];  Service: Endoscopy;;  gastric   COLONOSCOPY WITH PROPOFOL N/A 10/01/2020   Procedure: COLONOSCOPY WITH PROPOFOL;  Surgeon: Eloise Harman, DO;  [facility];  Service: Endoscopy;  Laterality: N/A;  2:15pm, pt know new time per office   ESOPHAGOGASTRODUODENOSCOPY (EGD) WITH PROPOFOL N/A 10/01/2020   Procedure: ESOPHAGOGASTRODUODENOSCOPY (EGD) WITH PROPOFOL;  Surgeon: Eloise Harman, DO;  [facility];  Service: Endoscopy;  Laterality: N/A;   HERNIA REPAIR     left inguinal   POLYPECTOMY  10/01/2020   Procedure: POLYPECTOMY INTESTINAL;  Surgeon: Eloise Harman, DO;  [facility];  Service: Endoscopy;;   STRABISMUS SURGERY Left 10/25/2020   Procedure: STRABISMUS REPAIR LEFT EYE;  Surgeon: Everitt Amber, MD;  Location: [place];  Service: Ophthalmology;  Laterality: Left;   TONSILLECTOMY     TOTAL KNEE ARTHROPLASTY Right 06/12/2022   Procedure: RIGHT TOTAL KNEE ARTHROPLASTY;  Surgeon: [name], MD;  [facility];  Service: Orthopedics;  Laterality: Right;    Allergies  No Known Allergies  History of Present Illness    Nicolas Berry is a 76 y.o. male who presents via audio/video conferencing for a telehealth visit today.  Pt was last seen in cardiology clinic on 05/15/2022 by Dr. Harl Bowie.  At that time Nicolas Berry was doing well.  The patient is now pending procedure as outlined above.  Call attempted x 3 with no answer. Voicemail left.    Home Medications    Prior to Admission medications   Medication Sig Start Date End Date Taking? Authorizing Provider  albuterol (VENTOLIN HFA) 108 (90 Base) MCG/ACT inhaler Inhale 2 puffs into the lungs every 4 (four) hours as needed for wheezing or shortness of breath.    [provider]  apixaban (ELIQUIS) 5 MG TABS tablet Take 1 tablet (5 mg total) by mouth 2 (two) times daily. 03/27/22   Arnoldo Lenis,  MD  carvedilol (COREG) 3.125 MG tablet TAKE 1 TABLET BY MOUTH TWICE DAILY STOPPING  CARDIZEM 02/03/21   Arnoldo Lenis, MD  Cholecalciferol 25 MCG (1000 UT) tablet Take 1,000 Units by mouth daily.    [provider]  dapagliflozin propanediol (FARXIGA) 5 MG TABS tablet Take 5 mg by mouth daily.    [provider]  DULoxetine (CYMBALTA) 60 MG capsule Take 60 mg by mouth every evening.    [provider]  famotidine (PEPCID) 20 MG tablet Take 20 mg by mouth daily.    [provider]  ferrous sulfate 325 (65 FE) MG tablet Take 325 mg by mouth daily with breakfast.    [provider]  finasteride (PROSCAR) 5 MG tablet Take 5 mg by mouth daily.    [provider]  fluticasone (FLONASE) 50 MCG/ACT nasal spray Place 1 spray into both nostrils daily as needed for allergies or rhinitis.    [provider]  Fluticasone-Salmeterol (ADVAIR) 250-50 MCG/DOSE AEPB Inhale 1 puff into the lungs 2 (two) times daily as needed (shortness of breath).    [provider]  gabapentin (NEURONTIN) 300 MG capsule Take 300 mg by mouth 3 (three) times daily. 01/05/18   [provider]  ibuprofen (ADVIL) 200 MG tablet Take 800 mg by mouth every 6 (six) hours as needed.    [provider]  losartan (COZAAR) 25 MG tablet Take 2 tablets (50 mg total) by mouth every morning AND 1 tablet (25 mg total) every evening. Patient taking differently: Take 25 mg by mouth daily  08/04/21   Arnoldo Lenis, MD  Menthol, Topical Analgesic, (BIOFREEZE EX) Apply 1 Application topically at bedtime.    [provider]  metFORMIN (GLUCOPHAGE) 500 MG tablet Take 500 mg by mouth 2 (two) times daily with a meal.    [provider]  methocarbamol (ROBAXIN) 500 MG tablet Take 1 tablet (500 mg total) by mouth every 6 (six) hours as needed for muscle spasms. 06/13/22   [name], MD  oxyCODONE (OXY IR/ROXICODONE) 5 MG immediate  release tablet Take 1 tablet (5 mg total) by mouth 3 (three) times daily as needed for moderate pain (pain score 4-6). No more than 6 tablets per day. 08/17/22   [name], MD  rosuvastatin (CRESTOR) 40 MG tablet Take 40 mg by mouth at bedtime.    [provider]  traZODone (DESYREL) 100 MG tablet Take 100 mg by mouth at bedtime.    [provider]    Physical Exam    Vital Signs:  Nicolas Berry does not have vital signs available for review today.  Given telephonic nature of communication, physical exam is limited. AAOx3. NAD. Normal affect.  Speech and respirations are unlabored.  Accessory Clinical Findings    None  Assessment & Plan    1.  Preoperative Cardiovascular Risk Assessment: Could not complete secondary to no answer x 3.   Will no charge  visit and attempt to reschedule.      Mayra Reel, NP  08/18/2022, 10:11 AM

## 2022-08-20 NOTE — Telephone Encounter (Signed)
Faxed to [facility] [place] last ov from Mayra Reel, NP to correct fax # via Epic.

## 2022-08-20 NOTE — Telephone Encounter (Signed)
   Courtney with [facility] calling back to f/u clearance. She said, they need clearance today since pt's procedure is tomorrow. She gave correct fax# [phone]

## 2022-08-21 DIAGNOSIS — H5089 Other specified strabismus: Secondary | ICD-10-CM | POA: Diagnosis not present

## 2022-08-21 DIAGNOSIS — Z7901 Long term (current) use of anticoagulants: Secondary | ICD-10-CM | POA: Diagnosis not present

## 2022-08-21 DIAGNOSIS — I4891 Unspecified atrial fibrillation: Secondary | ICD-10-CM | POA: Diagnosis not present

## 2022-08-21 DIAGNOSIS — J449 Chronic obstructive pulmonary disease, unspecified: Secondary | ICD-10-CM | POA: Diagnosis not present

## 2022-08-21 DIAGNOSIS — N189 Chronic kidney disease, unspecified: Secondary | ICD-10-CM | POA: Diagnosis not present

## 2022-08-21 DIAGNOSIS — E1122 Type 2 diabetes mellitus with diabetic chronic kidney disease: Secondary | ICD-10-CM | POA: Diagnosis not present

## 2022-08-21 DIAGNOSIS — Z9889 Other specified postprocedural states: Secondary | ICD-10-CM | POA: Diagnosis not present

## 2022-08-21 DIAGNOSIS — Z79899 Other long term (current) drug therapy: Secondary | ICD-10-CM | POA: Diagnosis not present

## 2022-08-21 DIAGNOSIS — I129 Hypertensive chronic kidney disease with stage 1 through stage 4 chronic kidney disease, or unspecified chronic kidney disease: Secondary | ICD-10-CM | POA: Diagnosis not present

## 2022-08-21 DIAGNOSIS — H50012 Monocular esotropia, left eye: Secondary | ICD-10-CM | POA: Diagnosis not present

## 2022-08-21 DIAGNOSIS — H5589 Other irregular eye movements: Secondary | ICD-10-CM | POA: Diagnosis not present

## 2022-08-21 DIAGNOSIS — I2510 Atherosclerotic heart disease of native coronary artery without angina pectoris: Secondary | ICD-10-CM | POA: Diagnosis not present

## 2022-08-21 DIAGNOSIS — H518 Other specified disorders of binocular movement: Secondary | ICD-10-CM | POA: Diagnosis not present

## 2022-08-21 DIAGNOSIS — H509 Unspecified strabismus: Secondary | ICD-10-CM | POA: Diagnosis not present

## 2022-08-21 DIAGNOSIS — H5000 Unspecified esotropia: Secondary | ICD-10-CM | POA: Diagnosis not present

## 2022-08-21 DIAGNOSIS — Z791 Long term (current) use of non-steroidal anti-inflammatories (NSAID): Secondary | ICD-10-CM | POA: Diagnosis not present

## 2022-08-21 DIAGNOSIS — H532 Diplopia: Secondary | ICD-10-CM | POA: Diagnosis not present

## 2022-08-21 DIAGNOSIS — F17210 Nicotine dependence, cigarettes, uncomplicated: Secondary | ICD-10-CM | POA: Diagnosis not present

## 2022-08-21 DIAGNOSIS — Z7984 Long term (current) use of oral hypoglycemic drugs: Secondary | ICD-10-CM | POA: Diagnosis not present

## 2022-08-31 ENCOUNTER — Telehealth: Payer: Self-pay | Admitting: Orthopaedic Surgery

## 2022-08-31 ENCOUNTER — Other Ambulatory Visit: Payer: Self-pay | Admitting: Orthopaedic Surgery

## 2022-08-31 MED ORDER — HYDROCODONE-ACETAMINOPHEN 5-325 MG PO TABS: 1.0000 | ORAL_TABLET | Freq: Three times a day (TID) | ORAL | 0 refills | Status: DC | PRN

## 2022-08-31 NOTE — Telephone Encounter (Signed)
Patient  would like a refill on oxycodone sent to pharmacy

## 2022-08-31 NOTE — Telephone Encounter (Signed)
Called and advised pt.

## 2022-09-07 DIAGNOSIS — R42 Dizziness and giddiness: Secondary | ICD-10-CM | POA: Diagnosis not present

## 2022-09-07 DIAGNOSIS — R778 Other specified abnormalities of plasma proteins: Secondary | ICD-10-CM | POA: Diagnosis not present

## 2022-09-07 DIAGNOSIS — K922 Gastrointestinal hemorrhage, unspecified: Secondary | ICD-10-CM | POA: Diagnosis not present

## 2022-09-07 DIAGNOSIS — R0602 Shortness of breath: Secondary | ICD-10-CM | POA: Diagnosis not present

## 2022-09-07 DIAGNOSIS — R531 Weakness: Secondary | ICD-10-CM | POA: Diagnosis not present

## 2022-09-07 DIAGNOSIS — E860 Dehydration: Secondary | ICD-10-CM | POA: Diagnosis not present

## 2022-09-07 DIAGNOSIS — R195 Other fecal abnormalities: Secondary | ICD-10-CM | POA: Diagnosis not present

## 2022-09-08 ENCOUNTER — Encounter (HOSPITAL_COMMUNITY): Payer: Self-pay

## 2022-09-08 ENCOUNTER — Inpatient Hospital Stay: Admit: 2022-09-08 | Payer: No Typology Code available for payment source | Admitting: Family Medicine

## 2022-09-10 DIAGNOSIS — Z7901 Long term (current) use of anticoagulants: Secondary | ICD-10-CM | POA: Diagnosis not present

## 2022-09-10 DIAGNOSIS — K921 Melena: Secondary | ICD-10-CM | POA: Diagnosis not present

## 2022-09-10 DIAGNOSIS — R531 Weakness: Secondary | ICD-10-CM | POA: Diagnosis not present

## 2022-09-10 DIAGNOSIS — E119 Type 2 diabetes mellitus without complications: Secondary | ICD-10-CM | POA: Diagnosis not present

## 2022-09-10 DIAGNOSIS — F32A Depression, unspecified: Secondary | ICD-10-CM | POA: Diagnosis not present

## 2022-09-10 DIAGNOSIS — D649 Anemia, unspecified: Secondary | ICD-10-CM | POA: Diagnosis not present

## 2022-09-10 DIAGNOSIS — H509 Unspecified strabismus: Secondary | ICD-10-CM | POA: Diagnosis not present

## 2022-09-10 DIAGNOSIS — R251 Tremor, unspecified: Secondary | ICD-10-CM | POA: Diagnosis not present

## 2022-09-10 DIAGNOSIS — F17210 Nicotine dependence, cigarettes, uncomplicated: Secondary | ICD-10-CM | POA: Diagnosis not present

## 2022-09-10 DIAGNOSIS — I10 Essential (primary) hypertension: Secondary | ICD-10-CM | POA: Diagnosis not present

## 2022-09-10 DIAGNOSIS — I080 Rheumatic disorders of both mitral and aortic valves: Secondary | ICD-10-CM | POA: Diagnosis not present

## 2022-09-10 DIAGNOSIS — K635 Polyp of colon: Secondary | ICD-10-CM | POA: Diagnosis not present

## 2022-09-10 DIAGNOSIS — E1122 Type 2 diabetes mellitus with diabetic chronic kidney disease: Secondary | ICD-10-CM | POA: Diagnosis not present

## 2022-09-10 DIAGNOSIS — Z79899 Other long term (current) drug therapy: Secondary | ICD-10-CM | POA: Diagnosis not present

## 2022-09-10 DIAGNOSIS — R7989 Other specified abnormal findings of blood chemistry: Secondary | ICD-10-CM | POA: Diagnosis not present

## 2022-09-10 DIAGNOSIS — Z681 Body mass index (BMI) 19 or less, adult: Secondary | ICD-10-CM | POA: Diagnosis not present

## 2022-09-10 DIAGNOSIS — K5731 Diverticulosis of large intestine without perforation or abscess with bleeding: Secondary | ICD-10-CM | POA: Diagnosis not present

## 2022-09-10 DIAGNOSIS — D123 Benign neoplasm of transverse colon: Secondary | ICD-10-CM | POA: Diagnosis not present

## 2022-09-10 DIAGNOSIS — N184 Chronic kidney disease, stage 4 (severe): Secondary | ICD-10-CM | POA: Diagnosis not present

## 2022-09-10 DIAGNOSIS — K31A0 Gastric intestinal metaplasia, unspecified: Secondary | ICD-10-CM | POA: Diagnosis not present

## 2022-09-10 DIAGNOSIS — I129 Hypertensive chronic kidney disease with stage 1 through stage 4 chronic kidney disease, or unspecified chronic kidney disease: Secondary | ICD-10-CM | POA: Diagnosis not present

## 2022-09-10 DIAGNOSIS — J449 Chronic obstructive pulmonary disease, unspecified: Secondary | ICD-10-CM | POA: Diagnosis not present

## 2022-09-10 DIAGNOSIS — D122 Benign neoplasm of ascending colon: Secondary | ICD-10-CM | POA: Diagnosis not present

## 2022-09-10 DIAGNOSIS — K222 Esophageal obstruction: Secondary | ICD-10-CM | POA: Diagnosis not present

## 2022-09-10 DIAGNOSIS — D62 Acute posthemorrhagic anemia: Secondary | ICD-10-CM | POA: Diagnosis not present

## 2022-09-10 DIAGNOSIS — R634 Abnormal weight loss: Secondary | ICD-10-CM | POA: Diagnosis not present

## 2022-09-10 DIAGNOSIS — G8929 Other chronic pain: Secondary | ICD-10-CM | POA: Diagnosis not present

## 2022-09-10 DIAGNOSIS — D61818 Other pancytopenia: Secondary | ICD-10-CM | POA: Diagnosis not present

## 2022-09-10 DIAGNOSIS — I480 Paroxysmal atrial fibrillation: Secondary | ICD-10-CM | POA: Diagnosis not present

## 2022-09-10 DIAGNOSIS — M1990 Unspecified osteoarthritis, unspecified site: Secondary | ICD-10-CM | POA: Diagnosis not present

## 2022-09-10 DIAGNOSIS — K3189 Other diseases of stomach and duodenum: Secondary | ICD-10-CM | POA: Diagnosis not present

## 2022-09-10 DIAGNOSIS — K922 Gastrointestinal hemorrhage, unspecified: Secondary | ICD-10-CM | POA: Diagnosis not present

## 2022-09-17 ENCOUNTER — Telehealth: Payer: Self-pay | Admitting: Orthopaedic Surgery

## 2022-09-17 ENCOUNTER — Other Ambulatory Visit: Payer: Self-pay | Admitting: Orthopaedic Surgery

## 2022-09-17 MED ORDER — HYDROCODONE-ACETAMINOPHEN 5-325 MG PO TABS: 1.0000 | ORAL_TABLET | Freq: Three times a day (TID) | ORAL | 0 refills | Status: DC | PRN

## 2022-09-17 NOTE — Telephone Encounter (Signed)
Called and advised pt.

## 2022-09-17 NOTE — Telephone Encounter (Signed)
Patient states he is in pain again and needs refill on Hydrocodone sent to [facility] please advise

## 2022-09-24 ENCOUNTER — Encounter (HOSPITAL_COMMUNITY): Payer: Self-pay

## 2022-09-24 ENCOUNTER — Emergency Department (HOSPITAL_COMMUNITY)
Admission: EM | Admit: 2022-09-24 | Discharge: 2022-09-24 | Discharge status: Against Medical Advice | Payer: No Typology Code available for payment source | Attending: Emergency Medicine | Admitting: Emergency Medicine

## 2022-09-24 DIAGNOSIS — Z5321 Procedure and treatment not carried out due to patient leaving prior to being seen by health care provider: Secondary | ICD-10-CM | POA: Insufficient documentation

## 2022-09-24 DIAGNOSIS — Z7901 Long term (current) use of anticoagulants: Secondary | ICD-10-CM | POA: Diagnosis not present

## 2022-09-24 DIAGNOSIS — D500 Iron deficiency anemia secondary to blood loss (chronic): Secondary | ICD-10-CM | POA: Diagnosis not present

## 2022-09-24 DIAGNOSIS — R031 Nonspecific low blood-pressure reading: Secondary | ICD-10-CM | POA: Diagnosis present

## 2022-09-24 DIAGNOSIS — K921 Melena: Secondary | ICD-10-CM | POA: Diagnosis not present

## 2022-09-24 DIAGNOSIS — I959 Hypotension, unspecified: Secondary | ICD-10-CM | POA: Diagnosis not present

## 2022-09-24 NOTE — ED Notes (Signed)
This RN informed by registration that pt left the ED

## 2022-09-24 NOTE — ED Triage Notes (Signed)
Pt's wife reports pt was at [place] appointment today and his BP was 80s/50s and was told to come to the ED. BP 145/69 in triage

## 2022-09-25 DIAGNOSIS — K921 Melena: Secondary | ICD-10-CM | POA: Diagnosis not present

## 2022-09-25 DIAGNOSIS — D500 Iron deficiency anemia secondary to blood loss (chronic): Secondary | ICD-10-CM | POA: Diagnosis not present

## 2022-09-25 DIAGNOSIS — Z09 Encounter for follow-up examination after completed treatment for conditions other than malignant neoplasm: Secondary | ICD-10-CM | POA: Diagnosis not present

## 2022-09-25 DIAGNOSIS — Z7901 Long term (current) use of anticoagulants: Secondary | ICD-10-CM | POA: Diagnosis not present

## 2022-09-30 ENCOUNTER — Ambulatory Visit: Payer: No Typology Code available for payment source | Admitting: Cardiology

## 2022-09-30 NOTE — Progress Notes (Deleted)
Clinical Summary Mr. Verga is a 77 y.o.male  seen today for follow up of the following medical problems.    1. Afib/PAF - previously seen in afib clinic - rate control with coreg, eliquis for stroke prevention.     - recent issues with anemia and heme + stools, has been off eliquis -  GI work up with EGD with gastritis, and colonscopy polyps no source of bleeding     - no recent palpitations - has remained off anticoag - denies any recent bleeding issues. Hgb over the last 6+ months across tests from different providers has been 15 or high   -bruising on eliquis, tried on xarelto and di worst. Back on eliquis, tolerating the bruising. No recent blood in stools.  - no recent palpitations     2. CAD - incidental finding on recent CT PE - 04/2016 nuclear stress test no ischemia   - no recent chest pains     3. Esophageal thickening - incidental finding on CT scan - followed by pcp and GI   - ongoing dysphagia     4. COPD - emphsymatous changes noted on recent CT PE - followed by pcp     5. Leg pains - occurs at rest. Occurs in thighs and calves.  - can be worst with walking.   01/2018 ABI right 0.98, left 0.92     6. HTN - compliant with meds     7. Hyperlipidemia - labs followed by [place] - he is on statin   8.CKD - followed by Dr Theador Hawthorne   9. Preop evaluation - considering knee replacement - tolerates above 4 METs without troubles - uses push mower 15-20 minutes without symptoms. [place], can be up inclines.        10. Valvular heart disease - 07/2020 echo LVEF 60-65%, no WMAs, mild MR, very mild AS mean grad 9, AVA VTI 2.64   11. Carotid bruits - Jan 2023 no significant stenosis   12. GI bleed/anemia - prior GI bleed in 2022 with unrevealing EGD/colonoscopy  - admit 08/2022 [facility] with GI bleed in setting of eliquis, NSAID use 08/2022 colonscopy: Old melena noted in colon.  No active bleeding.  Small  bowel bleed vs  right-sided diverticular bleed possible  08/2022 EGD: No clear source of upper GI bleed on endoscopy today, multiple looks in  the stomach and duodenum without any lesions seen  - Jan 4 seen in GI clinic, low bp's and referred to ER. In triage normal bp's, patient left -  Past Medical History:  Diagnosis Date   Anemia    Anxiety    OA multiple joints   Arthritis    Atrial fibrillation (HCC)    BPH (benign prostatic hyperplasia)    COPD (chronic obstructive pulmonary disease) (HCC)    uses inhaler   Coronary artery disease    DDD (degenerative disc disease), lumbar    Diabetes mellitus without complication (HCC)    Dyspnea    Fatty liver    GERD (gastroesophageal reflux disease)    Headache    Hyperlipidemia    Hypertension      No Known Allergies   Current Outpatient Medications  Medication Sig Dispense Refill   albuterol (VENTOLIN HFA) 108 (90 Base) MCG/ACT inhaler Inhale 2 puffs into the lungs every 4 (four) hours as needed for wheezing or shortness of breath.     apixaban (ELIQUIS) 5 MG TABS tablet Take 1  tablet (5 mg total) by mouth 2 (two) times daily. 180 tablet 1   carvedilol (COREG) 3.125 MG tablet TAKE 1 TABLET BY MOUTH TWICE DAILY STOPPING  CARDIZEM 180 tablet 3   Cholecalciferol 25 MCG (1000 UT) tablet Take 1,000 Units by mouth daily.     dapagliflozin propanediol (FARXIGA) 5 MG TABS tablet Take 5 mg by mouth daily.     DULoxetine (CYMBALTA) 60 MG capsule Take 60 mg by mouth every evening.     famotidine (PEPCID) 20 MG tablet Take 20 mg by mouth daily.     ferrous sulfate 325 (65 FE) MG tablet Take 325 mg by mouth daily with breakfast.     finasteride (PROSCAR) 5 MG tablet Take 5 mg by mouth daily.     fluticasone (FLONASE) 50 MCG/ACT nasal spray Place 1 spray into both nostrils daily as needed for allergies or rhinitis.     Fluticasone-Salmeterol (ADVAIR) 250-50 MCG/DOSE AEPB Inhale 1 puff into the lungs 2 (two) times daily as needed (shortness  of breath).     gabapentin (NEURONTIN) 300 MG capsule Take 300 mg by mouth 3 (three) times daily.  1   HYDROcodone-acetaminophen (NORCO/VICODIN) 5-325 MG tablet Take 1-2 tablets by mouth 3 (three) times daily as needed for moderate pain. 30 tablet 0   ibuprofen (ADVIL) 200 MG tablet Take 800 mg by mouth every 6 (six) hours as needed.     losartan (COZAAR) 25 MG tablet Take 2 tablets (50 mg total) by mouth every morning AND 1 tablet (25 mg total) every evening. (Patient taking differently: Take 25 mg by mouth daily )     Menthol, Topical Analgesic, (BIOFREEZE EX) Apply 1 Application topically at bedtime.     metFORMIN (GLUCOPHAGE) 500 MG tablet Take 500 mg by mouth 2 (two) times daily with a meal.     methocarbamol (ROBAXIN) 500 MG tablet Take 1 tablet (500 mg total) by mouth every 6 (six) hours as needed for muscle spasms. 40 tablet 1   rosuvastatin (CRESTOR) 40 MG tablet Take 40 mg by mouth at bedtime.     traZODone (DESYREL) 100 MG tablet Take 100 mg by mouth at bedtime.     No current facility-administered medications for this visit.     Past Surgical History:  Procedure Laterality Date   BACK SURGERY     C6-C7   BALLOON DILATION N/A 10/01/2020   Procedure: BALLOON DILATION;  Surgeon: Eloise Harman, DO;  [facility];  Service: Endoscopy;  Laterality: N/A;   BIOPSY  10/01/2020   Procedure: BIOPSY;  Surgeon: Eloise Harman, DO;  [facility];  Service: Endoscopy;;  gastric   COLONOSCOPY WITH PROPOFOL N/A 10/01/2020   Procedure: COLONOSCOPY WITH PROPOFOL;  Surgeon: Eloise Harman, DO;  [facility];  Service: Endoscopy;  Laterality: N/A;  2:15pm, pt know new time per office   ESOPHAGOGASTRODUODENOSCOPY (EGD) WITH PROPOFOL N/A 10/01/2020   Procedure: ESOPHAGOGASTRODUODENOSCOPY (EGD) WITH PROPOFOL;  Surgeon: Eloise Harman, DO;  [facility];  Service: Endoscopy;  Laterality: N/A;   HERNIA REPAIR     left inguinal   POLYPECTOMY   10/01/2020   Procedure: POLYPECTOMY INTESTINAL;  Surgeon: Eloise Harman, DO;  [facility];  Service: Endoscopy;;   STRABISMUS SURGERY Left 10/25/2020   Procedure: STRABISMUS REPAIR LEFT EYE;  Surgeon: Everitt Amber, MD;  Location: [place];  Service: Ophthalmology;  Laterality: Left;   TONSILLECTOMY     TOTAL KNEE ARTHROPLASTY  Right 06/12/2022   Procedure: RIGHT TOTAL KNEE ARTHROPLASTY;  Surgeon: [name], MD;  [facility];  Service: Orthopedics;  Laterality: Right;     No Known Allergies    Family History  Problem Relation Age of Onset   Cancer Mother        bladder   Diabetes Father    Cancer Sister        breast    Colon cancer Neg Hx      Social History Mr. Bartmess reports that he has been smoking cigarettes. He started smoking about 62 years ago. He has a 90.00 pack-year smoking history. He has never used smokeless tobacco. Mr. Delis reports current alcohol use.   Review of Systems CONSTITUTIONAL: No weight loss, fever, chills, weakness or fatigue.  HEENT: Eyes: No visual loss, blurred vision, double vision or yellow sclerae.No hearing loss, sneezing, congestion, runny nose or sore throat.  SKIN: No rash or itching.  CARDIOVASCULAR:  RESPIRATORY: No shortness of breath, cough or sputum.  GASTROINTESTINAL: No anorexia, nausea, vomiting or diarrhea. No abdominal pain or blood.  GENITOURINARY: No burning on urination, no polyuria NEUROLOGICAL: No headache, dizziness, syncope, paralysis, ataxia, numbness or tingling in the extremities. No change in bowel or bladder control.  MUSCULOSKELETAL: No muscle, back pain, joint pain or stiffness.  LYMPHATICS: No enlarged nodes. No history of splenectomy.  PSYCHIATRIC: No history of depression or anxiety.  ENDOCRINOLOGIC: No reports of sweating, cold or heat intolerance. No polyuria or polydipsia.  Marland Kitchen   Physical Examination There were no vitals filed for this visit. There  were no vitals filed for this visit.  Gen: resting comfortably, no acute distress HEENT: no scleral icterus, pupils equal round and reactive, no palptable cervical adenopathy,  CV Resp: Clear to auscultation bilaterally GI: abdomen is soft, non-tender, non-distended, normal bowel sounds, no hepatosplenomegaly MSK: extremities are warm, no edema.  Skin: warm, no rash Neuro:  no focal deficits Psych: appropriate affect   Diagnostic Studies  04/2016 echo Study Conclusions   - Left ventricle: Turbulent flow through LVOT. Systolic anterior   motio of MV leaflets Peak gradient through LV/LVOT /AV is 15 mm   Hg at rest No signifiant obstruction at rest. The cavity size was   normal. Wall thickness was increased in a pattern of mild LVH.   Systolic function was vigorous. The estimated ejection fraction   was in the range of 65% to 70%. Doppler parameters are consistent   with abnormal left ventricular relaxation (grade 1 diastolic   dysfunction). - Aortic valve: AV is thickened, calcified with minimally   restricted motion. - Mitral valve: SAM of mitral valve leaflets. Calcified annulus.   Mildly thickened leaflets . - Left atrium: The atrium was mildly dilated. - Pulmonary arteries: PA peak pressure: 32 mm Hg (S).   04/2016 CT PE IMPRESSION: 1. No evidence of acute pulmonary embolism. 2. Mild cardiomegaly. Left ventricular hypertrophy. Mild aortic valvular calcification. 3. Moderate three-vessel coronary atherosclerosis and mild aortic atherosclerosis. 4. Mild circumferential wall thickening involving the upper thoracic esophagus and of the distal esophagus. Query esophagitis as no distinct mass is identified. Upper endoscopy may be confirmatory. 5. COPD/emphysema.  No acute cardiopulmonary disease.       04/2016 Nuclear stress There was no ST segment deviation noted during stress. No T wave inversion was noted during stress. The study is normal. This is a low risk  study. The left ventricular ejection fraction is hyperdynamic (>65%).     01/2018 ABIs FINDINGS:  Right ABI:  0.98   Left ABI:  0.92   Right Lower Extremity:  Normal arterial waveforms at the ankle.   Left Lower Extremity:  Normal arterial waveforms at the ankle.   IMPRESSION: 1. Borderline normal bilateral ABIs. Consider postexercise testing to exclude occult arterial occlusive disease.     07/2020 echo IMPRESSIONS     1. Left ventricular ejection fraction, by estimation, is 60 to 65%. The  left ventricle has normal function. The left ventricle has no regional  wall motion abnormalities. There is moderate left ventricular hypertrophy.  Left ventricular diastolic  parameters were normal.   2. Right ventricular systolic function is normal. The right ventricular  size is normal. There is normal pulmonary artery systolic pressure.   3. Left atrial size was mildly dilated.   4. Chordal SAM without LVOT gradient. The mitral valve is degenerative.  Mild mitral valve regurgitation. No evidence of mitral stenosis. Severe  mitral annular calcification.   5. The aortic valve is tricuspid. There is moderate calcification of the  aortic valve. There is moderate thickening of the aortic valve. Aortic  valve regurgitation is trivial. Mild aortic valve stenosis.   6. The inferior vena cava is dilated in size with >50% respiratory  variability, suggesting right atrial pressure of 8 mmHg.    08/2022 echo This result has an attachment that is not available.  Incomplete study. Patient refused to continue with scan half way through  due to pain.  Normal left ventricle systolic function, LVEF 26-71%  Aortic valve not very well-visualized but appears moderately thickened and  calcified.  Left Ventricle  Left ventricle size is normal. There is mild concentric hypertrophy. Systolic function is normal. EF: 60-65%. Wall motion cannot be accurately assessed. Unable to assess diastolic function  due to incomplete study.   Right Ventricle  Right ventricle was not well visualized. Systolic function is normal.   Left Atrium  Left atrium size is normal.   Right Atrium  Right atrium was not well visualized.   Mitral Valve  The mitral valve was not well visualized. The leaflets are mildly thickened. There is mild posterior annular calcification. There is mild regurgitation.   Tricuspid Valve  The tricuspid valve was not well visualized. The leaflets exhibit probably normal excursion. There is trace regurgitation. Unable to assess RVSP due to incomplete Doppler signal.   Aortic Valve  The aortic valve was not well visualized. The leaflets are moderately thickened and exhibit at least mildly reduced excursion. The leaflets are severely calcified.   Pulmonic Valve  The pulmonic valve was not well visualized. Trace regurgitation. There is no evidence of pulmonic valve stenosis.   Ascending Aorta  The aortic root is normal in size.   Pericardium  There is no pericardial effusion.      Assessment and Plan  1. PAF - no symptoms. EKG today shows sinus brady at 56 - chronic bruising on eliquis, prior GI bleed had to be held but no current issues.  - he is interested in Kimberly device, we will refer him to clinic for evaluation.    2. CAD - noted on CT scan, nuclear stress test shows no significant ischemia - no symptoms, continue to monitor   3.HTN -he is at goal, continue current meds   4. Preoperative evaluation - no active acute cardiac conditions. Tolerates greater than 4 METs regularly without exertional symptoms - ok to proceed with knee surgery from cardiac standpoint. Can hold eliquis 2 days prior, resume day after.  Arnoldo Lenis, M.D.

## 2022-10-01 ENCOUNTER — Telehealth: Payer: Self-pay

## 2022-10-01 ENCOUNTER — Ambulatory Visit: Payer: No Typology Code available for payment source | Admitting: Nurse Practitioner

## 2022-10-01 DIAGNOSIS — I5032 Chronic diastolic (congestive) heart failure: Secondary | ICD-10-CM | POA: Diagnosis not present

## 2022-10-01 DIAGNOSIS — D696 Thrombocytopenia, unspecified: Secondary | ICD-10-CM | POA: Diagnosis not present

## 2022-10-01 DIAGNOSIS — E1129 Type 2 diabetes mellitus with other diabetic kidney complication: Secondary | ICD-10-CM | POA: Diagnosis not present

## 2022-10-01 DIAGNOSIS — N189 Chronic kidney disease, unspecified: Secondary | ICD-10-CM | POA: Diagnosis not present

## 2022-10-01 DIAGNOSIS — E1122 Type 2 diabetes mellitus with diabetic chronic kidney disease: Secondary | ICD-10-CM | POA: Diagnosis not present

## 2022-10-01 NOTE — Telephone Encounter (Signed)
Reached the patient. He stated he was busy and will call back later.

## 2022-10-01 NOTE — Telephone Encounter (Signed)
Scheduled the patient 10/19/2022 with Dr. Quentin Ore to discuss [place]. The patient was grateful for call and agreed with plan.

## 2022-10-01 NOTE — Telephone Encounter (Signed)
The patient was previously scheduled for [facility] consult with Dr. Quentin Ore in October 2023.  He cancelled this appointment stating he was recovering from surgery and would call when he was ready to reschedule.  Unfortunately, he ended up at an outside facility over the holidays with a GIB. It was unclear whether the patient would plan for Watchman implant there or at [facility]. He was scheduled for follow-up with Dr. Harl Bowie yesterday but was late and therefore his appointment was cancelled.  Spoke with Dr. Harl Bowie - the patient wishes to proceed with Watchman workup at [facility].   Called to reschedule consult but no answer. Left message to call back.

## 2022-10-05 DIAGNOSIS — E1122 Type 2 diabetes mellitus with diabetic chronic kidney disease: Secondary | ICD-10-CM | POA: Diagnosis not present

## 2022-10-05 DIAGNOSIS — I129 Hypertensive chronic kidney disease with stage 1 through stage 4 chronic kidney disease, or unspecified chronic kidney disease: Secondary | ICD-10-CM | POA: Diagnosis not present

## 2022-10-05 DIAGNOSIS — D508 Other iron deficiency anemias: Secondary | ICD-10-CM | POA: Diagnosis not present

## 2022-10-05 DIAGNOSIS — N189 Chronic kidney disease, unspecified: Secondary | ICD-10-CM | POA: Diagnosis not present

## 2022-10-05 DIAGNOSIS — E1129 Type 2 diabetes mellitus with other diabetic kidney complication: Secondary | ICD-10-CM | POA: Diagnosis not present

## 2022-10-05 DIAGNOSIS — R809 Proteinuria, unspecified: Secondary | ICD-10-CM | POA: Diagnosis not present

## 2022-10-05 DIAGNOSIS — R42 Dizziness and giddiness: Secondary | ICD-10-CM | POA: Diagnosis not present

## 2022-10-05 DIAGNOSIS — I5032 Chronic diastolic (congestive) heart failure: Secondary | ICD-10-CM | POA: Diagnosis not present

## 2022-10-07 ENCOUNTER — Telehealth: Payer: Self-pay | Admitting: Pharmacy Technician

## 2022-10-07 ENCOUNTER — Other Ambulatory Visit: Payer: Self-pay

## 2022-10-07 ENCOUNTER — Ambulatory Visit (INDEPENDENT_AMBULATORY_CARE_PROVIDER_SITE_OTHER): Payer: No Typology Code available for payment source | Admitting: Orthopaedic Surgery

## 2022-10-07 ENCOUNTER — Encounter: Payer: Self-pay | Admitting: Orthopaedic Surgery

## 2022-10-07 DIAGNOSIS — Z96651 Presence of right artificial knee joint: Secondary | ICD-10-CM

## 2022-10-07 MED ORDER — HYDROCODONE-ACETAMINOPHEN 5-325 MG PO TABS: 1.0000 | ORAL_TABLET | Freq: Three times a day (TID) | ORAL | 0 refills | Status: DC | PRN

## 2022-10-07 NOTE — Progress Notes (Signed)
The patient is now almost 4 months status post a right total knee arthroplasty.  He did well postoperative from orthopedic standpoint but he had other medical issues that he had to be hospitalized for December.  His surgery was in September as far as his right knee.  He is needing to go to physical therapy again.  He is back to ambulate with a cane.  He is weak and cachectic.  He is on chronic hydrocodone and does need a refill from me but he understands this will be the last time I can refill this medication.  On examination his right operative knee has good range of motion but it is not full yet.  Both lower extremities are weak.  The right knee feels ligamentously stable and there is some slight swelling to be expected.  His calf is soft.  There is no evidence of infection.  I did give him a prescription for outpatient physical therapy to work on bilateral lower extremity strengthening as well as balance and coordination.  I will see him back in 3 months to see how he is doing overall and at that visit we will have an AP and lateral of his right operative knee.  I will send in 1 more prescription for hydrocodone as well.

## 2022-10-07 NOTE — Telephone Encounter (Addendum)
Patient will need community of care approval to be seen [facility] .  Awaiting call back from [facility] Phone: [phone] ([phone]) ID/SS# [id number].  Have been unsuccessful in receiving a call back for an approval of community of care.. 3rd  attempt 10/19/22. Message has been delivered [facility] Pannell/NP Ref: [id number]  @ 10/19/22

## 2022-10-09 ENCOUNTER — Encounter (HOSPITAL_COMMUNITY): Payer: Self-pay | Admitting: Nephrology

## 2022-10-13 ENCOUNTER — Inpatient Hospital Stay: Payer: 59

## 2022-10-13 ENCOUNTER — Inpatient Hospital Stay: Payer: 59 | Attending: Hematology | Admitting: Hematology

## 2022-10-13 VITALS — BP 149/74 | HR 64 | Temp 97.7°F | Resp 18 | Ht 70.0 in | Wt 142.1 lb

## 2022-10-13 DIAGNOSIS — J449 Chronic obstructive pulmonary disease, unspecified: Secondary | ICD-10-CM | POA: Diagnosis not present

## 2022-10-13 DIAGNOSIS — R059 Cough, unspecified: Secondary | ICD-10-CM | POA: Insufficient documentation

## 2022-10-13 DIAGNOSIS — E1122 Type 2 diabetes mellitus with diabetic chronic kidney disease: Secondary | ICD-10-CM | POA: Diagnosis not present

## 2022-10-13 DIAGNOSIS — Z87891 Personal history of nicotine dependence: Secondary | ICD-10-CM

## 2022-10-13 DIAGNOSIS — K922 Gastrointestinal hemorrhage, unspecified: Secondary | ICD-10-CM | POA: Diagnosis not present

## 2022-10-13 DIAGNOSIS — M2550 Pain in unspecified joint: Secondary | ICD-10-CM | POA: Diagnosis not present

## 2022-10-13 DIAGNOSIS — R519 Headache, unspecified: Secondary | ICD-10-CM | POA: Diagnosis not present

## 2022-10-13 DIAGNOSIS — R634 Abnormal weight loss: Secondary | ICD-10-CM | POA: Insufficient documentation

## 2022-10-13 DIAGNOSIS — I5032 Chronic diastolic (congestive) heart failure: Secondary | ICD-10-CM | POA: Insufficient documentation

## 2022-10-13 DIAGNOSIS — N183 Chronic kidney disease, stage 3 unspecified: Secondary | ICD-10-CM | POA: Diagnosis not present

## 2022-10-13 DIAGNOSIS — D500 Iron deficiency anemia secondary to blood loss (chronic): Secondary | ICD-10-CM | POA: Insufficient documentation

## 2022-10-13 DIAGNOSIS — Z8052 Family history of malignant neoplasm of bladder: Secondary | ICD-10-CM | POA: Diagnosis not present

## 2022-10-13 DIAGNOSIS — Z803 Family history of malignant neoplasm of breast: Secondary | ICD-10-CM | POA: Insufficient documentation

## 2022-10-13 DIAGNOSIS — I4891 Unspecified atrial fibrillation: Secondary | ICD-10-CM | POA: Diagnosis not present

## 2022-10-13 DIAGNOSIS — Z833 Family history of diabetes mellitus: Secondary | ICD-10-CM | POA: Insufficient documentation

## 2022-10-13 DIAGNOSIS — F17210 Nicotine dependence, cigarettes, uncomplicated: Secondary | ICD-10-CM | POA: Insufficient documentation

## 2022-10-13 DIAGNOSIS — I130 Hypertensive heart and chronic kidney disease with heart failure and stage 1 through stage 4 chronic kidney disease, or unspecified chronic kidney disease: Secondary | ICD-10-CM | POA: Insufficient documentation

## 2022-10-13 DIAGNOSIS — R0602 Shortness of breath: Secondary | ICD-10-CM | POA: Diagnosis not present

## 2022-10-13 DIAGNOSIS — R195 Other fecal abnormalities: Secondary | ICD-10-CM | POA: Diagnosis not present

## 2022-10-13 DIAGNOSIS — D649 Anemia, unspecified: Secondary | ICD-10-CM

## 2022-10-13 DIAGNOSIS — F32A Depression, unspecified: Secondary | ICD-10-CM | POA: Insufficient documentation

## 2022-10-13 DIAGNOSIS — R531 Weakness: Secondary | ICD-10-CM | POA: Insufficient documentation

## 2022-10-13 DIAGNOSIS — K5900 Constipation, unspecified: Secondary | ICD-10-CM | POA: Insufficient documentation

## 2022-10-13 DIAGNOSIS — G479 Sleep disorder, unspecified: Secondary | ICD-10-CM | POA: Diagnosis not present

## 2022-10-13 DIAGNOSIS — R42 Dizziness and giddiness: Secondary | ICD-10-CM | POA: Insufficient documentation

## 2022-10-13 DIAGNOSIS — Z79899 Other long term (current) drug therapy: Secondary | ICD-10-CM | POA: Diagnosis not present

## 2022-10-13 LAB — CBC WITH DIFFERENTIAL/PLATELET
Abs Immature Granulocytes: 0.02 10*3/uL (ref 0.00–0.07)
Basophils Absolute: 0.1 10*3/uL (ref 0.0–0.1)
Basophils Relative: 1 %
Eosinophils Absolute: 0.1 10*3/uL (ref 0.0–0.5)
Eosinophils Relative: 2 %
HCT: 32.9 % — ABNORMAL LOW (ref 39.0–52.0)
Hemoglobin: 9.5 g/dL — ABNORMAL LOW (ref 13.0–17.0)
Immature Granulocytes: 0 %
Lymphocytes Relative: 30 %
Lymphs Abs: 1.9 10*3/uL (ref 0.7–4.0)
MCH: 29.6 pg (ref 26.0–34.0)
MCHC: 28.9 g/dL — ABNORMAL LOW (ref 30.0–36.0)
MCV: 102.5 fL — ABNORMAL HIGH (ref 80.0–100.0)
Monocytes Absolute: 0.5 10*3/uL (ref 0.1–1.0)
Monocytes Relative: 8 %
Neutro Abs: 3.8 10*3/uL (ref 1.7–7.7)
Neutrophils Relative %: 59 %
Platelets: 189 10*3/uL (ref 150–400)
RBC: 3.21 MIL/uL — ABNORMAL LOW (ref 4.22–5.81)
RDW: 16.7 % — ABNORMAL HIGH (ref 11.5–15.5)
WBC: 6.3 10*3/uL (ref 4.0–10.5)
nRBC: 0 % (ref 0.0–0.2)

## 2022-10-13 LAB — COMPREHENSIVE METABOLIC PANEL
ALT: 63 U/L — ABNORMAL HIGH (ref 0–44)
AST: 54 U/L — ABNORMAL HIGH (ref 15–41)
Albumin: 3.6 g/dL (ref 3.5–5.0)
Alkaline Phosphatase: 48 U/L (ref 38–126)
Anion gap: 7 (ref 5–15)
BUN: 14 mg/dL (ref 8–23)
CO2: 27 mmol/L (ref 22–32)
Calcium: 9.5 mg/dL (ref 8.9–10.3)
Chloride: 103 mmol/L (ref 98–111)
Creatinine, Ser: 1.13 mg/dL (ref 0.61–1.24)
GFR, Estimated: >60 mL/min (ref >60)
Glucose, Bld: 102 mg/dL — ABNORMAL HIGH (ref 70–99)
Potassium: 4.9 mmol/L (ref 3.5–5.1)
Sodium: 137 mmol/L (ref 135–145)
Total Bilirubin: 0.4 mg/dL (ref 0.3–1.2)
Total Protein: 6.6 g/dL (ref 6.5–8.1)

## 2022-10-13 LAB — RETICULOCYTES
Immature Retic Fract: 33.8 % — ABNORMAL HIGH (ref 2.3–15.9)
RBC.: 3.16 MIL/uL — ABNORMAL LOW (ref 4.22–5.81)
Retic Count, Absolute: 138.4 10*3/uL (ref 19.0–186.0)
Retic Ct Pct: 4.4 % — ABNORMAL HIGH (ref 0.4–3.1)

## 2022-10-13 LAB — FERRITIN: Ferritin: 16 ng/mL — ABNORMAL LOW (ref 24–336)

## 2022-10-13 LAB — IRON AND TIBC
Iron: 71 ug/dL (ref 45–182)
Saturation Ratios: 16 % — ABNORMAL LOW (ref 17.9–39.5)
TIBC: 452 ug/dL — ABNORMAL HIGH (ref 250–450)
UIBC: 381 ug/dL

## 2022-10-13 LAB — FOLATE: Folate: 7.7 ng/mL (ref >5.9)

## 2022-10-13 LAB — VITAMIN B12: Vitamin B-12: 370 pg/mL (ref 180–914)

## 2022-10-13 LAB — LACTATE DEHYDROGENASE: LDH: 163 U/L (ref 98–192)

## 2022-10-13 NOTE — Patient Instructions (Addendum)
[facility] [place] **   You were seen today by Dr. Delton Coombes & Tarri Abernethy PA-C for your iron deficiency anemia.    IRON DEFICIENCY ANEMIA: This is related to your chronic GI blood loss. Continue to follow-up with gastroenterology to find the source of your stomach/intestinal bleeding. Continue to follow-up with cardiology to see if you should take Eliquis or not, or if you should receive Watchman device instead. We will schedule you for IV iron x 2 doses. Continue to take iron tablet daily.  TOBACCO USE: Please see the attached handout for important information about smoking cessation. We will schedule you for low-dose CT of your chest to screen for lung cancer.  This test can be repeated once a year until you turn 80.  LABS: Check labs TODAY before leaving the hospital building.  FOLLOW-UP APPOINTMENT: Same-day labs and office visit in 6 weeks.  ** Thank you for trusting me with your healthcare!  I strive to provide all of my patients with quality care at each visit.  If you receive a survey for this visit, I would be so grateful to you for taking the time to provide feedback.  Thank you in advance!  ~ Corrin Sieling                   Dr. Derek Jack   &   Tarri Abernethy, PA-C   - - - - - - - - - - - - - - - - - -    Thank you for choosing [facility] to provide your oncology and hematology care.  To afford each patient quality time with our provider, please arrive at least 15 minutes before your scheduled appointment time.   If you have a lab appointment with the [facility] please come in thru the Main Entrance and check in at the main information desk.  You need to re-schedule your appointment should you arrive 10 or more minutes late.  We strive to give you quality time with our providers, and arriving late affects you and other patients whose appointments are  after yours.  Also, if you no show three or more times for appointments you may be dismissed from the clinic at the providers discretion.     Again, thank you for choosing [facility].  Our hope is that these requests will decrease the amount of time that you wait before being seen by our physicians.       _____________________________________________________________  Should you have questions after your visit to [facility], please contact our office at [phone] and follow the prompts.  Our office hours are 8:00 a.m. and 4:30 p.m. Monday - Friday.  Please note that voicemails left after 4:00 p.m. may not be returned until the following business day.  We are closed weekends and major holidays.  You do have access to a nurse 24-7, just call the main number to the clinic [phone] and do not press any options, hold on the line and a nurse will answer the phone.    For prescription refill requests, have your pharmacy contact our office and allow 72 hours.

## 2022-10-13 NOTE — Progress Notes (Unsigned)
[place] [address], [place] [address]   CLINIC:  Medical Oncology/Hematology  CONSULT NOTE  Patient Care Team: Anthoney Harada, NP as PCP - General (Family Medicine) Harl Bowie, Alphonse Guild, MD as PCP - Cardiology (Cardiology) Eloise Harman, DO as Consulting Physician (Internal Medicine) Myrla Halsted, MD as Referring Physician (Family Medicine)  CHIEF COMPLAINTS/PURPOSE OF CONSULTATION:  Iron deficiency anemia   HISTORY OF PRESENTING ILLNESS:   Nicolas Berry 77 y.o. male is here at the request of Dr. Theador Hawthorne (nephrologist) for evaluation and management of iron deficiency anemia.  Patient was recently hospitalized (December 2023) at [facility] for acute blood loss anemia secondary to upper GI bleeding.  Hemoglobin was as low as 6.2, received 2 units PRBC.  Prior to hospital admission, he had been noticing black stools, which has happened on and off since he was placed on Eliquis for his atrial fibrillation.  EGD/colonoscopy on 09/12/2022 while admitted at [facility] - EGD showed mild nonerosive antral gastropathy; there was old melena noted in the colon but no active bleeding, suspicious for small bowel bleed versus right-sided diverticular bleed.  Patient has been off of Eliquis since hospitalization.  He was placed on iron tablets once daily at discharge.  He is following up with outpatient GI at [facility] with plans for video capsule endoscopy later this week.  He is being evaluated by his cardiologist for possible Watchman device.  Patient is unsure whether or not he is still having melena, as he notes that his stools have been dark ever since he started taking the iron pill.  He has previously received IV iron via his nephrologist in May 2023.  He reports extreme fatigue and generalized weakness as well as dyspnea on exertion, lightheadedness, and headaches.  He did have approximately 20 pounds of weight loss within the past year which he attributes  to "food not tasting good," but he has regained his sense of taste and normal appetite and is starting to eat like normal again; he is regaining some of the weight he lost.  His past medical history is otherwise significant for CKD stage III, hypertension, diastolic CHF, diabetes, atrial fibrillation (on Eliquis), and COPD.  Patient lives at home with his wife.  He is independent at baseline, but has been walking with a cane since hospitalization due to severe weakness and fatigue.  He is a current everyday smoker, smoking 2 PPD cigarettes since age 67; recently cut back to 1.5 PPD cigarettes for the past year.  He drinks occasional alcohol.  Denies any illicit drug use.  Patient's mother deceased secondary to gallbladder cancer.  Patient's sister deceased secondary to breast cancer.  Patient's daughter deceased from [place].   MEDICAL HISTORY:  Past Medical History:  Diagnosis Date   Anemia    Anxiety    OA multiple joints   Arthritis    Atrial fibrillation (HCC)    BPH (benign prostatic hyperplasia)    COPD (chronic obstructive pulmonary disease) (HCC)    uses inhaler   Coronary artery disease    DDD (degenerative disc disease), lumbar    Diabetes mellitus without complication (HCC)    Dyspnea    Fatty liver    GERD (gastroesophageal reflux disease)    Headache    Hyperlipidemia    Hypertension     SURGICAL HISTORY: Past Surgical History:  Procedure Laterality Date   BACK SURGERY     C6-C7   BALLOON DILATION N/A 10/01/2020   Procedure:  BALLOON DILATION;  Surgeon: Eloise Harman, DO;  [facility];  Service: Endoscopy;  Laterality: N/A;   BIOPSY  10/01/2020   Procedure: BIOPSY;  Surgeon: Eloise Harman, DO;  [facility];  Service: Endoscopy;;  gastric   COLONOSCOPY WITH PROPOFOL N/A 10/01/2020   Procedure: COLONOSCOPY WITH PROPOFOL;  Surgeon: Eloise Harman, DO;  [facility];  Service: Endoscopy;  Laterality: N/A;  2:15pm, pt know  new time per office   ESOPHAGOGASTRODUODENOSCOPY (EGD) WITH PROPOFOL N/A 10/01/2020   Procedure: ESOPHAGOGASTRODUODENOSCOPY (EGD) WITH PROPOFOL;  Surgeon: Eloise Harman, DO;  [facility];  Service: Endoscopy;  Laterality: N/A;   HERNIA REPAIR     left inguinal   POLYPECTOMY  10/01/2020   Procedure: POLYPECTOMY INTESTINAL;  Surgeon: Eloise Harman, DO;  [facility];  Service: Endoscopy;;   STRABISMUS SURGERY Left 10/25/2020   Procedure: STRABISMUS REPAIR LEFT EYE;  Surgeon: Everitt Amber, MD;  Location: [facility];  Service: Ophthalmology;  Laterality: Left;   TONSILLECTOMY     TOTAL KNEE ARTHROPLASTY Right 06/12/2022   Procedure: RIGHT TOTAL KNEE ARTHROPLASTY;  Surgeon: [name], MD;  Location: [facility];  Service: Orthopedics;  Laterality: Right;    SOCIAL HISTORY: Social History   Socioeconomic History   Marital status: Married    Spouse name: Not on file   Number of children: 3   Years of education: Not on file   Highest education level: Not on file  Occupational History   Occupation: retired  Tobacco Use   Smoking status: Every Day    Packs/day: 1.50    Years: 60.00    Total pack years: 90.00    Types: Cigarettes    Start date: 08/06/1960   Smokeless tobacco: Never  Vaping Use   Vaping Use: Former  Substance and Sexual Activity   Alcohol use: Yes    Comment: rarely   Drug use: Never   Sexual activity: Not on file  Other Topics Concern   Not on file  Social History Narrative   Not on file   Social Determinants of Health   Financial Resource Strain: Not on file  Food Insecurity: Unknown (06/12/2022)   Hunger Vital Sign    Worried About Running Out of Food in the Last Year: Patient refused    Ran Out of Food in the Last Year: Patient refused  Transportation Needs: Not on file  Physical Activity: Not on file  Stress: Not on file  Social Connections: Not on file  Intimate Partner Violence: Unknown (06/12/2022)    Humiliation, Afraid, Rape, and Kick questionnaire    Fear of Current or Ex-Partner: Patient refused    Emotionally Abused: Not on file    Physically Abused: Not on file    Sexually Abused: Not on file    FAMILY HISTORY: Family History  Problem Relation Age of Onset   Cancer Mother        bladder   Diabetes Father    Cancer Sister        breast    Colon cancer Neg Hx     ALLERGIES:  has No Known Allergies.  MEDICATIONS:  Current Outpatient Medications  Medication Sig Dispense Refill   albuterol (VENTOLIN HFA) 108 (90 Base) MCG/ACT inhaler Inhale 2 puffs into the lungs every 4 (four) hours as needed for wheezing or shortness of breath.     apixaban (ELIQUIS) 5 MG TABS tablet Take 1 tablet (5 mg total)  by mouth 2 (two) times daily. 180 tablet 1   carvedilol (COREG) 3.125 MG tablet TAKE 1 TABLET BY MOUTH TWICE DAILY STOPPING  CARDIZEM 180 tablet 3   Cholecalciferol 25 MCG (1000 UT) tablet Take 1,000 Units by mouth daily.     dapagliflozin propanediol (FARXIGA) 5 MG TABS tablet Take 5 mg by mouth daily.     DULoxetine (CYMBALTA) 60 MG capsule Take 60 mg by mouth every evening.     famotidine (PEPCID) 20 MG tablet Take 20 mg by mouth daily.     ferrous sulfate 325 (65 FE) MG tablet Take 325 mg by mouth daily with breakfast.     finasteride (PROSCAR) 5 MG tablet Take 5 mg by mouth daily.     fluticasone (FLONASE) 50 MCG/ACT nasal spray Place 1 spray into both nostrils daily as needed for allergies or rhinitis.     Fluticasone-Salmeterol (ADVAIR) 250-50 MCG/DOSE AEPB Inhale 1 puff into the lungs 2 (two) times daily as needed (shortness of breath).     gabapentin (NEURONTIN) 300 MG capsule Take 300 mg by mouth 3 (three) times daily.  1   HYDROcodone-acetaminophen (NORCO/VICODIN) 5-325 MG tablet Take 1-2 tablets by mouth 3 (three) times daily as needed for moderate pain. 30 tablet 0   ibuprofen (ADVIL) 200 MG tablet Take 800 mg by mouth every 6 (six) hours as needed.     losartan  (COZAAR) 25 MG tablet Take 2 tablets (50 mg total) by mouth every morning AND 1 tablet (25 mg total) every evening. (Patient taking differently: Take 25 mg by mouth daily )     Menthol, Topical Analgesic, (BIOFREEZE EX) Apply 1 Application topically at bedtime.     metFORMIN (GLUCOPHAGE) 500 MG tablet Take 500 mg by mouth 2 (two) times daily with a meal.     methocarbamol (ROBAXIN) 500 MG tablet Take 1 tablet (500 mg total) by mouth every 6 (six) hours as needed for muscle spasms. 40 tablet 1   rosuvastatin (CRESTOR) 40 MG tablet Take 40 mg by mouth at bedtime.     traZODone (DESYREL) 100 MG tablet Take 100 mg by mouth at bedtime.     No current facility-administered medications for this visit.    REVIEW OF SYSTEMS:    Review of Systems  Constitutional:  Positive for fatigue. Negative for appetite change, chills, diaphoresis, fever and unexpected weight change.  HENT:   Positive for trouble swallowing. Negative for lump/mass and nosebleeds.   Eyes:  Negative for eye problems.  Respiratory:  Positive for cough and shortness of breath. Negative for hemoptysis.   Cardiovascular:  Negative for chest pain, leg swelling and palpitations.  Gastrointestinal:  Positive for constipation. Negative for abdominal pain, blood in stool, diarrhea, nausea and vomiting.  Genitourinary:  Negative for hematuria.   Musculoskeletal:  Positive for arthralgias.  Skin: Negative.   Neurological:  Positive for headaches and light-headedness. Negative for dizziness.  Hematological:  Does not bruise/bleed easily.  Psychiatric/Behavioral:  Positive for depression and sleep disturbance. The patient is nervous/anxious.       PHYSICAL EXAMINATION:   ECOG PERFORMANCE STATUS: 2 - Symptomatic, <50% confined to bed  There were no vitals filed for this visit. There were no vitals filed for this visit.  Physical Exam Constitutional:      Appearance: Normal appearance.  HENT:     Head: Normocephalic and atraumatic.      Mouth/Throat:     Mouth: Mucous membranes are moist.  Eyes:     Extraocular Movements:  Extraocular movements intact.     Pupils: Pupils are equal, round, and reactive to light.  Cardiovascular:     Rate and Rhythm: Normal rate and regular rhythm.     Pulses: Normal pulses.     Heart sounds: Normal heart sounds.  Pulmonary:     Effort: Pulmonary effort is normal.     Breath sounds: Normal breath sounds.  Abdominal:     General: Bowel sounds are normal.     Palpations: Abdomen is soft.     Tenderness: There is no abdominal tenderness.  Musculoskeletal:        General: No swelling.     Right lower leg: No edema.     Left lower leg: No edema.  Lymphadenopathy:     Cervical: No cervical adenopathy.  Skin:    General: Skin is warm and dry.  Neurological:     General: No focal deficit present.     Mental Status: He is alert and oriented to person, place, and time.  Psychiatric:        Mood and Affect: Mood normal.        Behavior: Behavior normal.      LABORATORY DATA:  I have reviewed the data as listed No results found for this or any previous visit (from the past 2160 hour(s)).  RADIOGRAPHIC STUDIES: I have personally reviewed the radiological images as listed and agreed with the findings in the report. No results found.   ASSESSMENT & PLAN:  1.  Severe iron deficiency anemia - Seen at the request of Dr. Theador Hawthorne (nephrologist) for evaluation and management of iron deficiency anemia. - Most recent labs by nephrology (10/01/2022): Hgb 8.4/MCV 95.3, ferritin 34, iron saturation 13%; renal function at baseline with creatinine 1.53/GFR 47. - Hospitalized December 2023 for acute blood loss anemia secondary to upper GI bleeding.  Hgb down to 6.2, received 2 units PRBC. - EGD/colonoscopy on 09/12/2022 while admitted at [facility] - EGD showed mild nonerosive antral gastropathy; there was old melena noted in the colon but no active bleeding, suspicious for small bowel bleed versus  right-sided diverticular bleed. - Scheduled for capsule endoscopy with GI later this week. - Was on Eliquis prior to his hospitalization, reported black stools in the weeks leading up to his hospital stay.  Eliquis has been held since hospital discharge. - Taking iron tablet since December 2023.  Previously received IV iron via nephrology. - Symptomatic with extreme fatigue and generalized weakness, dyspnea on exertion, lightheadedness, and headaches - PLAN: Labs today including nutritional panel, hemolysis labs, and MGUS/myeloma panel to investigate other potential contributing factors to the patient's anemia. - Will schedule for IV Feraheme x 2.  We have discussed risk of side effects and allergic reaction, and patient is agreeable to proceed. - We will repeat CBC and iron panel with same-day office visit in 6 weeks.  2.  Tobacco use - He is a current everyday smoker, smoking 2 PPD cigarettes since age 41; recently cut back to 1.5 PPD cigarettes for the past year. - Patient meets criteria for annual LDCT chest for lung cancer screening.  He is agreeable. - PLAN: We will schedule for LDCT chest.  3.  Atrial fibrillation - Eliquis currently being held - Pending evaluation by cardiology for possible Watchman device  4.  Other history - PMH:  CKD stage III, hypertension, diastolic CHF, diabetes, atrial fibrillation (on Eliquis), and COPD.  - SOCIAL: Patient lives at home with his wife. He is independent at baseline, but  has been walking with a cane since hospitalization due to severe weakness and fatigue.  - SUBSTANCE: He is a current everyday smoker, smoking 2 PPD cigarettes since age 20; recently cut back to 1.5 PPD cigarettes for the past year. He drinks occasional alcohol. Denies any illicit drug use.  - FAMILY: Patient's mother deceased secondary to gallbladder cancer. Patient's sister deceased secondary to breast cancer. Patient's daughter deceased from [place].    PLAN SUMMARY: >>  Labs TODAY >> Feraheme x 2 >> Schedule for LDCT chest for lung cancer screening >> Same-day labs (CBC/D, ferritin, iron/TIBC) and office visit in 6 weeks    All questions were answered. The patient knows to call the clinic with any problems, questions or concerns.  Medical decision making: Moderate  Time spent on visit: I spent 30 minutes counseling the patient face to face. The total time spent in the appointment was 55 minutes and more than 50% was on counseling.  I, Tarri Abernethy PA-C, have seen this patient in conjunction with Dr. Derek Jack.  Greater than 50% of visit was performed by Dr. Delton Coombes.   Harriett Rush, PA-C 10/14/2022 7:02 AM  DR. Kaylaann Mountz: I have independently evaluated this patient and formulated my assessment and plan.  I agree with HPI, assessment and plan written by Casey Burkitt, PA-C.  Patient evaluated at the request of Dr. Theador Hawthorne for anemia from CKD and relative iron deficiency.  We will check for other causes.  We will schedule him for Feraheme x 2.  We discussed side effects in detail.  He will also qualify for lung cancer screening CT scan.  He will be reevaluated in 6 weeks.

## 2022-10-14 ENCOUNTER — Encounter: Payer: Self-pay | Admitting: Hematology

## 2022-10-14 ENCOUNTER — Other Ambulatory Visit: Payer: Self-pay

## 2022-10-14 ENCOUNTER — Encounter (HOSPITAL_COMMUNITY): Payer: Self-pay | Admitting: Nephrology

## 2022-10-14 DIAGNOSIS — D500 Iron deficiency anemia secondary to blood loss (chronic): Secondary | ICD-10-CM

## 2022-10-14 DIAGNOSIS — D649 Anemia, unspecified: Secondary | ICD-10-CM

## 2022-10-14 LAB — KAPPA/LAMBDA LIGHT CHAINS
Kappa free light chain: 37.7 mg/L — ABNORMAL HIGH (ref 3.3–19.4)
Kappa, lambda light chain ratio: 1.58 (ref 0.26–1.65)
Lambda free light chains: 23.9 mg/L (ref 5.7–26.3)

## 2022-10-15 DIAGNOSIS — R251 Tremor, unspecified: Secondary | ICD-10-CM | POA: Diagnosis not present

## 2022-10-15 LAB — COPPER, SERUM: Copper: 97 ug/dL (ref 69–132)

## 2022-10-16 LAB — METHYLMALONIC ACID, SERUM: Methylmalonic Acid, Quantitative: 296 nmol/L (ref 0–378)

## 2022-10-16 LAB — PROTEIN ELECTROPHORESIS, SERUM
A/G Ratio: 1.5 (ref 0.7–1.7)
Albumin ELP: 3.5 g/dL (ref 2.9–4.4)
Alpha-1-Globulin: 0.2 g/dL (ref 0.0–0.4)
Alpha-2-Globulin: 0.8 g/dL (ref 0.4–1.0)
Beta Globulin: 0.8 g/dL (ref 0.7–1.3)
Gamma Globulin: 0.6 g/dL (ref 0.4–1.8)
Globulin, Total: 2.4 g/dL (ref 2.2–3.9)
Total Protein ELP: 5.9 g/dL — ABNORMAL LOW (ref 6.0–8.5)

## 2022-10-18 NOTE — Progress Notes (Deleted)
Electrophysiology Office Note:    Date:  10/18/2022   ID:  Nicolas Berry, DOB [birth year], MRN [id number]  PCP:  Nicolas Jack, MD  [facility] Cardiologist:  Nicolas Dolly, MD  [facility] Electrophysiologist:  None   Referring MD: Nicolas Harada, NP   Chief Complaint: AF  History of Present Illness:    Nicolas Berry is a 77 y.o. male who presents for an evaluation of AF at the request of Nicolas Berry. Their medical history includes pAF, GI bleeding and anemia. Also with COPD, DM, HTN and HLD.  Saw Nicolas Berry last in 04/2022.   He is referred to discuss Watchman given his problems with chronic anticoagulation.    Past Medical History:  Diagnosis Date   Anemia    Anxiety    OA multiple joints   Arthritis    Atrial fibrillation (HCC)    BPH (benign prostatic hyperplasia)    COPD (chronic obstructive pulmonary disease) ([place])    uses inhaler   Coronary artery disease    DDD (degenerative disc disease), lumbar    Diabetes mellitus without complication ([place])    Dyspnea    Fatty liver    GERD (gastroesophageal reflux disease)    Headache    Hyperlipidemia    Hypertension     Past Surgical History:  Procedure Laterality Date   BACK SURGERY     C6-C7   BALLOON DILATION N/A 10/01/2020   Procedure: BALLOON DILATION;  Surgeon: Nicolas Harman, DO;  [facility];  Service: Endoscopy;  Laterality: N/A;   BIOPSY  10/01/2020   Procedure: BIOPSY;  Surgeon: Nicolas Harman, DO;  [facility];  Service: Endoscopy;;  gastric   COLONOSCOPY WITH PROPOFOL N/A 10/01/2020   Procedure: COLONOSCOPY WITH PROPOFOL;  Surgeon: Nicolas Harman, DO;  [facility];  Service: Endoscopy;  Laterality: N/A;  2:15pm, pt know new time per office   ESOPHAGOGASTRODUODENOSCOPY (EGD) WITH PROPOFOL N/A 10/01/2020   Procedure: ESOPHAGOGASTRODUODENOSCOPY (EGD) WITH PROPOFOL;  Surgeon: Nicolas Harman, DO;  [facility];  Service: Endoscopy;   Laterality: N/A;   HERNIA REPAIR     left inguinal   POLYPECTOMY  10/01/2020   Procedure: POLYPECTOMY INTESTINAL;  Surgeon: Nicolas Harman, DO;  [facility];  Service: Endoscopy;;   STRABISMUS SURGERY Left 10/25/2020   Procedure: STRABISMUS REPAIR LEFT EYE;  Surgeon: Everitt Amber, MD;  Location: [place];  Service: Ophthalmology;  Laterality: Left;   TONSILLECTOMY     TOTAL KNEE ARTHROPLASTY Right 06/12/2022   Procedure: RIGHT TOTAL KNEE ARTHROPLASTY;  Surgeon: Nicolas Rossetti, MD;  Location: [facility];  Service: Orthopedics;  Laterality: Right;    Current Medications: No outpatient medications have been marked as taking for the 10/19/22 encounter (Appointment) with Nicolas Epley, MD.     Allergies:   Lorazepam   Social History   Socioeconomic History   Marital status: Married    Spouse name: Not on file   Number of children: 3   Years of education: Not on file   Highest education level: Not on file  Occupational History   Occupation: retired  Tobacco Use   Smoking status: Every Day    Packs/day: 1.50    Years: 60.00    Total pack years: 90.00    Types: Cigarettes    Start date: 08/06/1960   Smokeless tobacco: Never  Vaping Use   Vaping Use: Former  Substance and Sexual Activity   Alcohol  use: Yes    Comment: rarely   Drug use: Never   Sexual activity: Not on file  Other Topics Concern   Not on file  Social History Narrative   Not on file   Social Determinants of Health   Financial Resource Strain: Not on file  Food Insecurity: No Food Insecurity (10/13/2022)   Hunger Vital Sign    Worried About Running Out of Food in the Last Year: Never true    Ran Out of Food in the Last Year: Never true  Transportation Needs: No Transportation Needs (10/13/2022)   PRAPARE - Hydrologist (Medical): No    Lack of Transportation (Non-Medical): No  Physical Activity: Not on file  Stress: Not on file  Social  Connections: Not on file     Family History: The patient's family history includes Cancer in his mother and sister; Diabetes in his father. There is no history of Colon cancer.  ROS:   Please see the history of present illness.    All other systems reviewed and are negative.  EKGs/Labs/Other Studies Reviewed:    The following studies were reviewed today:  07/2020 echo - EF 60, Rvnormal, LA dilated, mild MR, mild AS.     Recent Labs: 10/13/2022: ALT 63; BUN 14; Creatinine, Ser 1.13; Hemoglobin 9.5; Platelets 189; Potassium 4.9; Sodium 137  Recent Lipid Panel    Component Value Date/Time   CHOL 118 05/18/2016 0442   TRIG 143 05/18/2016 0442   HDL 31 (L) 05/18/2016 0442   CHOLHDL 3.8 05/18/2016 0442   VLDL 29 05/18/2016 0442   LDLCALC 58 05/18/2016 0442    Physical Exam:    VS:  There were no vitals taken for this visit.    Wt Readings from Last 3 Encounters:  10/13/22 142 lb 1.6 oz (64.5 kg)  09/24/22 136 lb (61.7 kg)  06/12/22 145 lb 6.4 oz (66 kg)     GEN: *** Well nourished, well developed in no acute distress CARDIAC: ***RRR, no murmurs, rubs, gallops RESPIRATORY:  Clear to auscultation without rales, wheezing or rhonchi      ASSESSMENT:    No diagnosis found. PLAN:    In order of problems listed above:   #pAF ***burden. Problems in the past with [place] due to bleeding, including recent hospitalization at [facility].  I have seen Nicolas Berry in the office today who is being considered for a Watchman left atrial appendage closure device. I believe they will benefit from this procedure given their history of atrial fibrillation, CHA2DS2-VASc score of 5 and unadjusted ischemic stroke rate of 7.2% per year. Unfortunately, the patient is not felt to be a long term anticoagulation candidate secondary to GI bleeding. The patient's chart has been reviewed and I feel that they would be a candidate for short term oral anticoagulation after Watchman implant.   It is  my belief that after undergoing a LAA closure procedure, Nicolas Berry will not need long term anticoagulation which eliminates anticoagulation side effects and major bleeding risk.   Procedural risks for the Watchman implant have been reviewed with the patient including a 0.5% risk of stroke, <1% risk of perforation and <1% risk of device embolization. Other risks include bleeding, vascular damage, tamponade, worsening renal function, and death. The patient understands these risk and wishes to proceed.     The published clinical data on the safety and effectiveness of WATCHMAN include but are not limited to the following: - Vertell Limber Nicolas, Mechele Claude, Sick  P et al. for the PROTECT AF Investigators. Percutaneous closure of the left atrial appendage versus warfarin therapy for prevention of stroke in patients with atrial fibrillation: a randomised non-inferiority trial. Lancet 2009; 374: 534-42. Mechele Claude, Doshi SK, Abelardo Diesel D et al. on behalf of the PROTECT AF Investigators. Percutaneous Left Atrial Appendage Closure for Stroke Prophylaxis in Patients With Atrial Fibrillation 2.3-Year Follow-up of the PROTECT AF (Watchman Left Atrial Appendage System for Embolic Protection in Patients With Atrial Fibrillation) Trial. Circulation 2013; 127:720-729. - Alli O, Doshi S,  Kar S, Reddy VY, Sievert H et al. Quality of Life Assessment in the Randomized PROTECT AF (Percutaneous Closure of the Left Atrial Appendage Versus Warfarin Therapy for Prevention of Stroke in Patients With Atrial Fibrillation) Trial of Patients at Risk for Stroke With Nonvalvular Atrial Fibrillation. J Am Coll Cardiol 2013; N8865744. Vertell Limber Nicolas, Tarri Abernethy, Price M, [place], Sievert H, Doshi S, Huber K, Reddy V. Prospective randomized evaluation of the Watchman left atrial appendage Device in patients with atrial fibrillation versus long-term warfarin therapy; the PREVAIL trial. Journal of the [employer] of Cardiology, Vol. 4, No.  1, 2014, 1-11. - Kar S, Doshi SK, Sadhu A, Horton R, Osorio J et al. Primary outcome evaluation of a next-generation left atrial appendage closure device: results from the PINNACLE FLX trial. Circulation 2021;143(18)1754-1762.    After today's visit with the patient which was dedicated solely for shared decision making visit regarding LAA closure device, the patient decided to proceed with the LAA appendage closure procedure scheduled to be done in the near future at [facility]. Prior to the procedure, I would like to obtain a gated CT scan of the chest with contrast timed for PV/LA visualization.   Additionally, the patient will need an updated echo  HAS-BLED score 3 Hypertension Yes  Abnormal renal and liver function (Dialysis, transplant, Cr >2.26 mg/dL /Cirrhosis or Bilirubin >2x Normal or AST/ALT/AP >3x Normal) No  Stroke No  Bleeding Yes  Labile INR (Unstable/high INR) No  Elderly (>65) Yes  Drugs or alcohol (? 8 drinks/week, anti-plt or NSAID) No   CHA2DS2-VASc Score = 5  The patient's score is based upon: CHF History: 0 HTN History: 1 Diabetes History: 1 Stroke History: 0 Vascular Disease History: 1 Age Score: 2 Gender Score: 0     Medication Adjustments/Labs and Tests Ordered: Current medicines are reviewed at length with the patient today.  Concerns regarding medicines are outlined above.  No orders of the defined types were placed in this encounter.  No orders of the defined types were placed in this encounter.    Signed, Hilton Cork. Quentin Ore, MD, [facility], [facility] 10/18/2022 5:30 PM    Electrophysiology [facility]

## 2022-10-19 ENCOUNTER — Inpatient Hospital Stay: Payer: 59

## 2022-10-19 ENCOUNTER — Ambulatory Visit: Payer: No Typology Code available for payment source | Admitting: Cardiology

## 2022-10-19 VITALS — BP 132/58 | HR 60 | Temp 98.3°F | Resp 18

## 2022-10-19 DIAGNOSIS — R0602 Shortness of breath: Secondary | ICD-10-CM | POA: Diagnosis not present

## 2022-10-19 DIAGNOSIS — R42 Dizziness and giddiness: Secondary | ICD-10-CM | POA: Diagnosis not present

## 2022-10-19 DIAGNOSIS — R531 Weakness: Secondary | ICD-10-CM | POA: Diagnosis not present

## 2022-10-19 DIAGNOSIS — R059 Cough, unspecified: Secondary | ICD-10-CM | POA: Diagnosis not present

## 2022-10-19 DIAGNOSIS — F32A Depression, unspecified: Secondary | ICD-10-CM | POA: Diagnosis not present

## 2022-10-19 DIAGNOSIS — M2550 Pain in unspecified joint: Secondary | ICD-10-CM | POA: Diagnosis not present

## 2022-10-19 DIAGNOSIS — F17210 Nicotine dependence, cigarettes, uncomplicated: Secondary | ICD-10-CM | POA: Diagnosis not present

## 2022-10-19 DIAGNOSIS — N183 Chronic kidney disease, stage 3 unspecified: Secondary | ICD-10-CM | POA: Diagnosis not present

## 2022-10-19 DIAGNOSIS — R195 Other fecal abnormalities: Secondary | ICD-10-CM | POA: Diagnosis not present

## 2022-10-19 DIAGNOSIS — K922 Gastrointestinal hemorrhage, unspecified: Secondary | ICD-10-CM | POA: Diagnosis not present

## 2022-10-19 DIAGNOSIS — K5900 Constipation, unspecified: Secondary | ICD-10-CM | POA: Diagnosis not present

## 2022-10-19 DIAGNOSIS — Z833 Family history of diabetes mellitus: Secondary | ICD-10-CM | POA: Diagnosis not present

## 2022-10-19 DIAGNOSIS — Z803 Family history of malignant neoplasm of breast: Secondary | ICD-10-CM | POA: Diagnosis not present

## 2022-10-19 DIAGNOSIS — G479 Sleep disorder, unspecified: Secondary | ICD-10-CM | POA: Diagnosis not present

## 2022-10-19 DIAGNOSIS — J449 Chronic obstructive pulmonary disease, unspecified: Secondary | ICD-10-CM | POA: Diagnosis not present

## 2022-10-19 DIAGNOSIS — I480 Paroxysmal atrial fibrillation: Secondary | ICD-10-CM

## 2022-10-19 DIAGNOSIS — E1122 Type 2 diabetes mellitus with diabetic chronic kidney disease: Secondary | ICD-10-CM | POA: Diagnosis not present

## 2022-10-19 DIAGNOSIS — Z8052 Family history of malignant neoplasm of bladder: Secondary | ICD-10-CM | POA: Diagnosis not present

## 2022-10-19 DIAGNOSIS — D500 Iron deficiency anemia secondary to blood loss (chronic): Secondary | ICD-10-CM

## 2022-10-19 DIAGNOSIS — R519 Headache, unspecified: Secondary | ICD-10-CM | POA: Diagnosis not present

## 2022-10-19 DIAGNOSIS — I5032 Chronic diastolic (congestive) heart failure: Secondary | ICD-10-CM | POA: Diagnosis not present

## 2022-10-19 DIAGNOSIS — I4891 Unspecified atrial fibrillation: Secondary | ICD-10-CM | POA: Diagnosis not present

## 2022-10-19 DIAGNOSIS — I130 Hypertensive heart and chronic kidney disease with heart failure and stage 1 through stage 4 chronic kidney disease, or unspecified chronic kidney disease: Secondary | ICD-10-CM | POA: Diagnosis not present

## 2022-10-19 DIAGNOSIS — Z79899 Other long term (current) drug therapy: Secondary | ICD-10-CM | POA: Diagnosis not present

## 2022-10-19 DIAGNOSIS — R634 Abnormal weight loss: Secondary | ICD-10-CM | POA: Diagnosis not present

## 2022-10-19 MED ORDER — LORATADINE 10 MG PO TABS: 10.0000 mg | ORAL_TABLET | Freq: Once | ORAL | Status: DC

## 2022-10-19 MED ORDER — SODIUM CHLORIDE 0.9 % IV SOLN: Freq: Once | INTRAVENOUS | Status: AC

## 2022-10-19 MED ORDER — ACETAMINOPHEN 325 MG PO TABS
650.0000 mg | ORAL_TABLET | Freq: Once | ORAL | Status: AC
  Administered 2022-10-19: 650 mg via ORAL
  Filled 2022-10-19: qty 2

## 2022-10-19 MED ORDER — CETIRIZINE HCL 10 MG PO TABS
10.0000 mg | ORAL_TABLET | Freq: Once | ORAL | Status: AC
  Administered 2022-10-19: 10 mg via ORAL
  Filled 2022-10-19: qty 1

## 2022-10-19 MED ORDER — SODIUM CHLORIDE 0.9 % IV SOLN
510.0000 mg | Freq: Once | INTRAVENOUS | Status: AC
  Administered 2022-10-19: 510 mg via INTRAVENOUS
  Filled 2022-10-19: qty 510

## 2022-10-19 NOTE — Progress Notes (Signed)
Patient presents today for iron infusion.  Patient is in satisfactory condition with no complaints voiced.  Vital signs are stable.  We will proceed with infusion per provider orders.  Patient tolerated treatment well with no complaints voiced.  Patient left via wheelchair with wife in stable condition.  Vital signs stable at discharge.  Follow up as scheduled.

## 2022-10-19 NOTE — Patient Instructions (Signed)
[facility]  Discharge Instructions: Thank you for choosing [facility] to provide your oncology and hematology care.  If you have a lab appointment with the Cancer Center, please come in thru the Main Entrance and check in at the main information desk.  Wear comfortable clothing and clothing appropriate for easy access to any Portacath or PICC line.   We strive to give you quality time with your provider. You may need to reschedule your appointment if you arrive late (15 or more minutes).  Arriving late affects you and other patients whose appointments are after yours.  Also, if you miss three or more appointments without notifying the office, you may be dismissed from the clinic at the provider's discretion.      For prescription refill requests, have your pharmacy contact our office and allow 72 hours for refills to be completed.    Ferumoxytol Injection What is this medication? FERUMOXYTOL (FER ue MOX i tol) treats low levels of iron in your body (iron deficiency anemia). Iron is a mineral that plays an important role in making red blood cells, which carry oxygen from your lungs to the rest of your body. This medicine may be used for other purposes; ask your health care provider or pharmacist if you have questions. COMMON BRAND NAME(S): Feraheme What should I tell my care team before I take this medication? They need to know if you have any of these conditions: Anemia not caused by low iron levels High levels of iron in the blood Magnetic resonance imaging (MRI) test scheduled An unusual or allergic reaction to iron, other medications, foods, dyes, or preservatives Pregnant or trying to get pregnant Breastfeeding How should I use this medication? This medication is injected into a vein. It is given by your care team in a hospital or clinic setting. Talk to your care team the use of this medication in children. Special care may be needed. Overdosage:  If you think you have taken too much of this medicine contact a poison control center or emergency room at once. NOTE: This medicine is only for you. Do not share this medicine with others. What if I miss a dose? It is important not to miss your dose. Call your care team if you are unable to keep an appointment. What may interact with this medication? Other iron products This list may not describe all possible interactions. Give your health care provider a list of all the medicines, herbs, non-prescription drugs, or dietary supplements you use. Also tell them if you smoke, drink alcohol, or use illegal drugs. Some items may interact with your medicine. What should I watch for while using this medication? Visit your care team regularly. Tell your care team if your symptoms do not start to get better or if they get worse. You may need blood work done while you are taking this medication. You may need to follow a special diet. Talk to your care team. Foods that contain iron include: whole grains/cereals, dried fruits, beans, or peas, leafy green vegetables, and organ meats (liver, kidney). What side effects may I notice from receiving this medication? Side effects that you should report to your care team as soon as possible: Allergic reactions--skin rash, itching, hives, swelling of the face, lips, tongue, or throat Low blood pressure--dizziness, feeling faint or lightheaded, blurry vision Shortness of breath Side effects that usually do not require medical attention (report to your care team if they continue or are bothersome): Flushing Headache   Joint pain Muscle pain Nausea Pain, redness, or irritation at injection site This list may not describe all possible side effects. Call your doctor for medical advice about side effects. You may report side effects to FDA at 1-800-FDA-1088. Where should I keep my medication? This medication is given in a hospital or clinic and will not be stored at  home. NOTE: This sheet is a summary. It may not cover all possible information. If you have questions about this medicine, talk to your doctor, pharmacist, or health care provider.  2023 Elsevier/Gold Standard (2021-01-29 00:00:00)    To help prevent nausea and vomiting after your treatment, we encourage you to take your nausea medication as directed.  BELOW ARE SYMPTOMS THAT SHOULD BE REPORTED IMMEDIATELY: *FEVER GREATER THAN 100.4 F (38 C) OR HIGHER *CHILLS OR SWEATING *NAUSEA AND VOMITING THAT IS NOT CONTROLLED WITH YOUR NAUSEA MEDICATION *UNUSUAL SHORTNESS OF BREATH *UNUSUAL BRUISING OR BLEEDING *URINARY PROBLEMS (pain or burning when urinating, or frequent urination) *BOWEL PROBLEMS (unusual diarrhea, constipation, pain near the anus) TENDERNESS IN MOUTH AND THROAT WITH OR WITHOUT PRESENCE OF ULCERS (sore throat, sores in mouth, or a toothache) UNUSUAL RASH, SWELLING OR PAIN  UNUSUAL VAGINAL DISCHARGE OR ITCHING   Items with * indicate a potential emergency and should be followed up as soon as possible or go to the Emergency Department if any problems should occur.  Please show the CHEMOTHERAPY ALERT CARD or IMMUNOTHERAPY ALERT CARD at check-in to the Emergency Department and triage nurse.  Should you have questions after your visit or need to cancel or reschedule your appointment, please contact [facility] [place] [phone]  and follow the prompts.  Office hours are 8:00 a.m. to 4:30 p.m. Monday - Friday. Please note that voicemails left after 4:00 p.m. may not be returned until the following business day.  We are closed weekends and major holidays. You have access to a nurse at all times for urgent questions. Please call the main number to the clinic [phone] and follow the prompts.  For any non-urgent questions, you may also contact your provider using MyChart. We now offer e-Visits for anyone 18 and older to request care online for non-urgent symptoms. For  details visit [email or web address].   Also download the MyChart app! Go to the app store, search "MyChart", open the app, select [facility], and log in with your MyChart username and password.   

## 2022-10-19 NOTE — Progress Notes (Incomplete)
Electrophysiology Office Note:    Date:  10/19/2022   ID:  Nicolas Berry, DOB Mar 01, 1946, MRN [id number]  PCP:  Derek Jack, MD  [facility] Cardiologist:  Carlyle Dolly, MD  [facility] Electrophysiologist:  Vickie Epley, MD   Referring MD: Anthoney Harada, NP   Chief Complaint: AF  History of Present Illness:    Nicolas Berry is a 77 y.o. male who presents for an evaluation of AF at the request of Dr branch. Their medical history includes pAF, GI bleeding and anemia. Also with COPD, DM, HTN and HLD.  Saw Dr Harl Bowie last in 04/2022.   He is referred to discuss Watchman given his problems with chronic anticoagulation.  Today,  He denies any chest pain, shortness of breath, or peripheral edema. No lightheadedness, headaches, syncope, orthopnea, or PND.  (+)  ***-     Past Medical History:  Diagnosis Date   Anemia    Anxiety    OA multiple joints   Arthritis    Atrial fibrillation (HCC)    BPH (benign prostatic hyperplasia)    COPD (chronic obstructive pulmonary disease) (HCC)    uses inhaler   Coronary artery disease    DDD (degenerative disc disease), lumbar    Diabetes mellitus without complication ([place])    Dyspnea    Fatty liver    GERD (gastroesophageal reflux disease)    Headache    Hyperlipidemia    Hypertension     Past Surgical History:  Procedure Laterality Date   BACK SURGERY     C6-C7   BALLOON DILATION N/A 10/01/2020   Procedure: BALLOON DILATION;  Surgeon: Eloise Harman, DO;  [facility];  Service: Endoscopy;  Laterality: N/A;   BIOPSY  10/01/2020   Procedure: BIOPSY;  Surgeon: Eloise Harman, DO;  [facility];  Service: Endoscopy;;  gastric   COLONOSCOPY WITH PROPOFOL N/A 10/01/2020   Procedure: COLONOSCOPY WITH PROPOFOL;  Surgeon: Eloise Harman, DO;  [facility];  Service: Endoscopy;  Laterality: N/A;  2:15pm, pt know new time per office   ESOPHAGOGASTRODUODENOSCOPY (EGD)  WITH PROPOFOL N/A 10/01/2020   Procedure: ESOPHAGOGASTRODUODENOSCOPY (EGD) WITH PROPOFOL;  Surgeon: Eloise Harman, DO;  [facility];  Service: Endoscopy;  Laterality: N/A;   HERNIA REPAIR     left inguinal   POLYPECTOMY  10/01/2020   Procedure: POLYPECTOMY INTESTINAL;  Surgeon: Eloise Harman, DO;  [facility];  Service: Endoscopy;;   STRABISMUS SURGERY Left 10/25/2020   Procedure: STRABISMUS REPAIR LEFT EYE;  Surgeon: Everitt Amber, MD;  Location: [place];  Service: Ophthalmology;  Laterality: Left;   TONSILLECTOMY     TOTAL KNEE ARTHROPLASTY Right 06/12/2022   Procedure: RIGHT TOTAL KNEE ARTHROPLASTY;  Surgeon: [name], MD;  Location: [facility];  Service: Orthopedics;  Laterality: Right;    Current Medications: No outpatient medications have been marked as taking for the 10/19/22 encounter (Appointment) with Vickie Epley, MD.     Allergies:   Lorazepam   Social History   Socioeconomic History   Marital status: Married    Spouse name: Not on file   Number of children: 3   Years of education: Not on file   Highest education level: Not on file  Occupational History   Occupation: retired  Tobacco Use   Smoking status: Every Day    Packs/day: 1.50    Years: 60.00    Total pack years: 90.00  Types: Cigarettes    Start date: 08/06/1960   Smokeless tobacco: Never  Vaping Use   Vaping Use: Former  Substance and Sexual Activity   Alcohol use: Yes    Comment: rarely   Drug use: Never   Sexual activity: Not on file  Other Topics Concern   Not on file  Social History Narrative   Not on file   Social Determinants of Health   Financial Resource Strain: Not on file  Food Insecurity: No Food Insecurity (10/13/2022)   Hunger Vital Sign    Worried About Running Out of Food in the Last Year: Never true    Ran Out of Food in the Last Year: Never true  Transportation Needs: No Transportation Needs (10/13/2022)    PRAPARE - Hydrologist (Medical): No    Lack of Transportation (Non-Medical): No  Physical Activity: Not on file  Stress: Not on file  Social Connections: Not on file     Family History: The patient's family history includes Cancer in his mother and sister; Diabetes in his father. There is no history of Colon cancer.  ROS:   Please see the history of present illness.     All other systems reviewed and are negative.  EKGs/Labs/Other Studies Reviewed:    The following studies were reviewed today:  07/2020 echo - EF 60, Rvnormal, LA dilated, mild MR, mild AS.     Recent Labs: 10/13/2022: ALT 63; BUN 14; Creatinine, Ser 1.13; Hemoglobin 9.5; Platelets 189; Potassium 4.9; Sodium 137  Recent Lipid Panel    Component Value Date/Time   CHOL 118 05/18/2016 0442   TRIG 143 05/18/2016 0442   HDL 31 (L) 05/18/2016 0442   CHOLHDL 3.8 05/18/2016 0442   VLDL 29 05/18/2016 0442   LDLCALC 58 05/18/2016 0442    Physical Exam:    VS:  There were no vitals taken for this visit.    Wt Readings from Last 3 Encounters:  10/13/22 142 lb 1.6 oz (64.5 kg)  09/24/22 136 lb (61.7 kg)  06/12/22 145 lb 6.4 oz (66 kg)     GEN:  Well nourished, well developed in no acute distress CARDIAC: ***RRR, no murmurs, rubs, gallops RESPIRATORY:  Clear to auscultation without rales, wheezing or rhonchi      ASSESSMENT:    1. Paroxysmal atrial fibrillation (HCC)   2. Gastrointestinal hemorrhage, unspecified gastrointestinal hemorrhage type    PLAN:    In order of problems listed above:   #pAF ***burden. Problems in the past with [facility] due to bleeding, including recent hospitalization at [facility].  I have seen Nicolas Berry in the office today who is being considered for a Watchman left atrial appendage closure device. I believe they will benefit from this procedure given their history of atrial fibrillation, CHA2DS2-VASc score of 5 and unadjusted ischemic stroke rate  of 7.2% per year. Unfortunately, the patient is not felt to be a long term anticoagulation candidate secondary to GI bleeding. The patient's chart has been reviewed and I feel that they would be a candidate for short term oral anticoagulation after Watchman implant.   It is my belief that after undergoing a LAA closure procedure, Nicolas Berry will not need long term anticoagulation which eliminates anticoagulation side effects and major bleeding risk.   Procedural risks for the Watchman implant have been reviewed with the patient including a 0.5% risk of stroke, <1% risk of perforation and <1% risk of device embolization. Other risks include bleeding, vascular damage,  tamponade, worsening renal function, and death. The patient understands these risk and wishes to proceed.     The published clinical data on the safety and effectiveness of WATCHMAN include but are not limited to the following: - Holmes DR, Mechele Claude, Sick P et al. for the PROTECT AF Investigators. Percutaneous closure of the left atrial appendage versus warfarin therapy for prevention of stroke in patients with atrial fibrillation: a randomised non-inferiority trial. Lancet 2009; 374: 534-42. Mechele Claude, Doshi SK, Abelardo Diesel D et al. on behalf of the PROTECT AF Investigators. Percutaneous Left Atrial Appendage Closure for Stroke Prophylaxis in Patients With Atrial Fibrillation 2.3-Year Follow-up of the PROTECT AF (Watchman Left Atrial Appendage System for Embolic Protection in Patients With Atrial Fibrillation) Trial. Circulation 2013; 127:720-729. - Alli O, Doshi S,  Kar S, Reddy VY, Sievert H et al. Quality of Life Assessment in the Randomized PROTECT AF (Percutaneous Closure of the Left Atrial Appendage Versus Warfarin Therapy for Prevention of Stroke in Patients With Atrial Fibrillation) Trial of Patients at Risk for Stroke With Nonvalvular Atrial Fibrillation. J Am Coll Cardiol 2013; 99:3570-1. Vertell Limber DR, Tarri Abernethy, Price M,  Powell, Sievert H, Doshi S, Huber K, Reddy V. Prospective randomized evaluation of the Watchman left atrial appendage Device in patients with atrial fibrillation versus long-term warfarin therapy; the PREVAIL trial. Journal of the [employer] of Cardiology, Vol. 4, No. 1, 2014, 1-11. - Kar S, Doshi SK, Sadhu A, Horton R, Osorio J et al. Primary outcome evaluation of a next-generation left atrial appendage closure device: results from the PINNACLE FLX trial. Circulation 2021;143(18)1754-1762.    After today's visit with the patient which was dedicated solely for shared decision making visit regarding LAA closure device, the patient decided to proceed with the LAA appendage closure procedure scheduled to be done in the near future at [facility]. Prior to the procedure, I would like to obtain a gated CT scan of the chest with contrast timed for PV/LA visualization.   Additionally, the patient will need an updated echo  HAS-BLED score 3 Hypertension Yes  Abnormal renal and liver function (Dialysis, transplant, Cr >2.26 mg/dL /Cirrhosis or Bilirubin >2x Normal or AST/ALT/AP >3x Normal) No  Stroke No  Bleeding Yes  Labile INR (Unstable/high INR) No  Elderly (>65) Yes  Drugs or alcohol (? 8 drinks/week, anti-plt or NSAID) No   CHA2DS2-VASc Score = 5  The patient's score is based upon: CHF History: 0 HTN History: 1 Diabetes History: 1 Stroke History: 0 Vascular Disease History: 1 Age Score: 2 Gender Score: 0   Follow-up: ***  Medication Adjustments/Labs and Tests Ordered: Current medicines are reviewed at length with the patient today.  Concerns regarding medicines are outlined above.  No orders of the defined types were placed in this encounter.  No orders of the defined types were placed in this encounter.   I,Mitra Faeizi,acting as a Education administrator for Vickie Epley, MD.,have documented all relevant documentation on the behalf of Vickie Epley, MD,as directed by   Vickie Epley, MD while in the presence of Vickie Epley, MD.  ***  Signed, Hilton Cork. Quentin Ore, MD, [facility], [facility] 10/19/2022 7:14 AM    Electrophysiology [facility]

## 2022-10-20 LAB — IMMUNOFIXATION ELECTROPHORESIS
IgA: 57 mg/dL — ABNORMAL LOW (ref 61–437)
IgG (Immunoglobin G), Serum: 703 mg/dL (ref 603–1613)
IgM (Immunoglobulin M), Srm: 72 mg/dL (ref 15–143)
Total Protein ELP: 5.9 g/dL — ABNORMAL LOW (ref 6.0–8.5)

## 2022-10-26 ENCOUNTER — Inpatient Hospital Stay: Payer: 59 | Attending: Hematology

## 2022-10-26 VITALS — BP 127/62 | HR 61 | Temp 96.9°F | Resp 18

## 2022-10-26 DIAGNOSIS — K922 Gastrointestinal hemorrhage, unspecified: Secondary | ICD-10-CM | POA: Insufficient documentation

## 2022-10-26 DIAGNOSIS — D500 Iron deficiency anemia secondary to blood loss (chronic): Secondary | ICD-10-CM | POA: Insufficient documentation

## 2022-10-26 DIAGNOSIS — Z79899 Other long term (current) drug therapy: Secondary | ICD-10-CM | POA: Diagnosis not present

## 2022-10-26 MED ORDER — ACETAMINOPHEN 325 MG PO TABS
650.0000 mg | ORAL_TABLET | Freq: Once | ORAL | Status: AC
  Administered 2022-10-26: 650 mg via ORAL
  Filled 2022-10-26: qty 2

## 2022-10-26 MED ORDER — SODIUM CHLORIDE 0.9 % IV SOLN
510.0000 mg | Freq: Once | INTRAVENOUS | Status: AC
  Administered 2022-10-26: 510 mg via INTRAVENOUS
  Filled 2022-10-26: qty 510

## 2022-10-26 MED ORDER — LORATADINE 10 MG PO TABS: 10.0000 mg | ORAL_TABLET | Freq: Once | ORAL | Status: DC

## 2022-10-26 MED ORDER — CETIRIZINE HCL 10 MG PO TABS
10.0000 mg | ORAL_TABLET | Freq: Once | ORAL | Status: AC
  Administered 2022-10-26: 10 mg via ORAL
  Filled 2022-10-26: qty 1

## 2022-10-26 MED ORDER — SODIUM CHLORIDE 0.9 % IV SOLN: Freq: Once | INTRAVENOUS | Status: AC

## 2022-10-26 NOTE — Progress Notes (Signed)
Feraheme infusion given per orders. Patient tolerated it well without problems. Vitals stable and discharged home from clinic via wheelchair. Follow up as scheduled.  

## 2022-10-26 NOTE — Patient Instructions (Signed)
[facility] [place]  Discharge Instructions: Thank you for choosing [facility] to provide your oncology and hematology care.  If you have a lab appointment with the Cancer Center, please come in thru the Main Entrance and check in at the main information desk.  Wear comfortable clothing and clothing appropriate for easy access to any Portacath or PICC line.   We strive to give you quality time with your provider. You may need to reschedule your appointment if you arrive late (15 or more minutes).  Arriving late affects you and other patients whose appointments are after yours.  Also, if you miss three or more appointments without notifying the office, you may be dismissed from the clinic at the provider's discretion.      For prescription refill requests, have your pharmacy contact our office and allow 72 hours for refills to be completed.    Today you received an iron infusion, feraheme.    To help prevent nausea and vomiting after your treatment, we encourage you to take your nausea medication as directed.  BELOW ARE SYMPTOMS THAT SHOULD BE REPORTED IMMEDIATELY: *FEVER GREATER THAN 100.4 F (38 C) OR HIGHER *CHILLS OR SWEATING *NAUSEA AND VOMITING THAT IS NOT CONTROLLED WITH YOUR NAUSEA MEDICATION *UNUSUAL SHORTNESS OF BREATH *UNUSUAL BRUISING OR BLEEDING *URINARY PROBLEMS (pain or burning when urinating, or frequent urination) *BOWEL PROBLEMS (unusual diarrhea, constipation, pain near the anus) TENDERNESS IN MOUTH AND THROAT WITH OR WITHOUT PRESENCE OF ULCERS (sore throat, sores in mouth, or a toothache) UNUSUAL RASH, SWELLING OR PAIN  UNUSUAL VAGINAL DISCHARGE OR ITCHING   Items with * indicate a potential emergency and should be followed up as soon as possible or go to the Emergency Department if any problems should occur.  Please show the CHEMOTHERAPY ALERT CARD or IMMUNOTHERAPY ALERT CARD at check-in to the Emergency Department and triage  nurse.  Should you have questions after your visit or need to cancel or reschedule your appointment, please contact [facility] [place] [phone]  and follow the prompts.  Office hours are 8:00 a.m. to 4:30 p.m. Monday - Friday. Please note that voicemails left after 4:00 p.m. may not be returned until the following business day.  We are closed weekends and major holidays. You have access to a nurse at all times for urgent questions. Please call the main number to the clinic [phone] and follow the prompts.  For any non-urgent questions, you may also contact your provider using MyChart. We now offer e-Visits for anyone 18 and older to request care online for non-urgent symptoms. For details visit [email or web address].   Also download the MyChart app! Go to the app store, search "MyChart", open the app, select [place], and log in with your MyChart username and password.   

## 2022-10-29 ENCOUNTER — Encounter: Payer: No Typology Code available for payment source | Admitting: Hematology

## 2022-10-30 DIAGNOSIS — H26492 Other secondary cataract, left eye: Secondary | ICD-10-CM | POA: Diagnosis not present

## 2022-11-03 ENCOUNTER — Ambulatory Visit (HOSPITAL_COMMUNITY)
Admission: RE | Admit: 2022-11-03 | Discharge: 2022-11-03 | Disposition: A | Discharge status: Home / Self Care | Payer: 59 | Source: Ambulatory Visit | Attending: Physician Assistant | Admitting: Physician Assistant

## 2022-11-03 DIAGNOSIS — Z87891 Personal history of nicotine dependence: Secondary | ICD-10-CM | POA: Insufficient documentation

## 2022-11-05 NOTE — Progress Notes (Signed)
Patient notified of LDCT Lung Cancer Screening Results via mail with the recommendation to follow-up in 12 months. Patient's referring provider has been sent a copy of results. Results are as follows:  IMPRESSION: 1. Lung-RADS 2, benign appearance or behavior. Continue annual screening with low-dose chest CT without contrast in 12 months. 2. Aortic Atherosclerosis (ICD10-I70.0) and Emphysema (ICD10-J43.9). 

## 2022-11-13 ENCOUNTER — Ambulatory Visit: Payer: No Typology Code available for payment source | Attending: Cardiology | Admitting: Cardiology

## 2022-11-13 ENCOUNTER — Encounter: Payer: Self-pay | Admitting: Cardiology

## 2022-11-13 VITALS — BP 120/70 | HR 72 | Ht 70.5 in | Wt 148.6 lb

## 2022-11-13 DIAGNOSIS — I480 Paroxysmal atrial fibrillation: Secondary | ICD-10-CM

## 2022-11-13 DIAGNOSIS — I2510 Atherosclerotic heart disease of native coronary artery without angina pectoris: Secondary | ICD-10-CM | POA: Diagnosis not present

## 2022-11-13 DIAGNOSIS — Z8719 Personal history of other diseases of the digestive system: Secondary | ICD-10-CM

## 2022-11-13 NOTE — Patient Instructions (Signed)
Medication Instructions:  Continue all current medications.   Labwork: none  Testing/Procedures: none  Follow-Up: 6 months   Any Other Special Instructions Will Be Listed Below (If Applicable). You have been referred to GI    If you need a refill on your cardiac medications before your next appointment, please call your pharmacy.

## 2022-11-13 NOTE — Progress Notes (Signed)
Clinical Summary Nicolas Berry is a 77 y.o.male seen today for follow up of the following medical problems.    1. Afib/PAF - previously seen in afib clinic - rate control with coreg, eliquis for stroke prevention.     - recent issues with anemia and heme + stools, has been off eliquis -  GI work up with EGD with gastritis, and colonscopy polyps no source of bleeding     - no recent palpitations - off eliquis due to GI bleed as documented below.    2. History of GI bleed - recently admitted [place] 09/10/2022 thru 09/12/2022 for acute blood loss anemia and melena  - He had a GIB episode last year 09/2020 but bidirectional endoscopy work up was unrevealing for active bleeding source. He was noted to have chronic active gastritis positive for intestinal metaplasia. Repeat EGD at [facility] 12/22 revealed wide open Schatzki's ring, mild non erosive antral gastropathy (negative for HP), normal visualized duodenum, but no blood or bleeding lesions seen throughout. Follow up colonoscopy 12/23 revealed few non-advanced adenomas, old melena in the colon but no active bleeding. Terminal ileum was not able to be intubated despite multiple attempts. He was suspected of having a small bowel bleed vs. right sided diverticular bleed.   - looking to estbalish more local to f/u with GI   2. CAD - incidental finding on recent CT PE - 04/2016 nuclear stress test no ischemia   - denies any chest pains     3. Esophageal thickening - incidental finding on CT scan - followed by pcp and GI   - ongoing dysphagia     4. COPD - emphsymatous changes noted on recent CT PE - followed by pcp     5. Leg pains - occurs at rest. Occurs in thighs and calves.  - can be worst with walking.   01/2018 ABI right 0.98, left 0.92     6. HTN - he is compliant with meds     7. Hyperlipidemia - labs followed by [place] - he is on statin   8.CKD - followed by Dr Theador Hawthorne   9. Preop evaluation - considering knee  replacement - tolerates above 4 METs without troubles - uses push mower 15-20 minutes without symptoms. [place], can be up inclines.        10. Valvular heart disease - 07/2020 echo LVEF 60-65%, no WMAs, mild MR, very mild AS mean grad 9, AVA VTI 2.64 08/2022 LVEF 60-65%, mild MR, thickened AV but no stenosis   11. Carotid bruits - Jan 2023 no significant stenosis Past Medical History:  Diagnosis Date   Anemia    Anxiety    OA multiple joints   Arthritis    Atrial fibrillation (HCC)    BPH (benign prostatic hyperplasia)    COPD (chronic obstructive pulmonary disease) (HCC)    uses inhaler   Coronary artery disease    DDD (degenerative disc disease), lumbar    Diabetes mellitus without complication (HCC)    Dyspnea    Fatty liver    GERD (gastroesophageal reflux disease)    Headache    Hyperlipidemia    Hypertension      Allergies  Allergen Reactions   Lorazepam Anxiety     Current Outpatient Medications  Medication Sig Dispense Refill   albuterol (VENTOLIN HFA) 108 (90 Base) MCG/ACT inhaler Inhale 2 puffs into the lungs every 4 (four) hours as needed for wheezing or shortness  of breath.     carvedilol (COREG) 3.125 MG tablet TAKE 1 TABLET BY MOUTH TWICE DAILY STOPPING  CARDIZEM 180 tablet 3   Cholecalciferol 25 MCG (1000 UT) tablet Take 1,000 Units by mouth daily.     dapagliflozin propanediol (FARXIGA) 5 MG TABS tablet Take 5 mg by mouth daily.     DULoxetine (CYMBALTA) 60 MG capsule Take 60 mg by mouth every evening.     famotidine (PEPCID) 20 MG tablet Take 20 mg by mouth daily.     ferrous sulfate 325 (65 FE) MG tablet Take 325 mg by mouth daily with breakfast.     finasteride (PROSCAR) 5 MG tablet Take 5 mg by mouth daily.     fluticasone (FLONASE) 50 MCG/ACT nasal spray Place 1 spray into both nostrils daily as needed for allergies or rhinitis.     Fluticasone-Salmeterol (ADVAIR) 250-50 MCG/DOSE AEPB Inhale 1 puff into the  lungs 2 (two) times daily as needed (shortness of breath).     gabapentin (NEURONTIN) 300 MG capsule Take 300 mg by mouth 3 (three) times daily.  1   HYDROcodone-acetaminophen (NORCO/VICODIN) 5-325 MG tablet Take 1-2 tablets by mouth 3 (three) times daily as needed for moderate pain. 30 tablet 0   losartan (COZAAR) 25 MG tablet Take 2 tablets (50 mg total) by mouth every morning AND 1 tablet (25 mg total) every evening. (Patient taking differently: Take 25 mg by mouth daily )     Menthol, Topical Analgesic, (BIOFREEZE EX) Apply 1 Application topically at bedtime.     metFORMIN (GLUCOPHAGE) 500 MG tablet Take 500 mg by mouth 2 (two) times daily with a meal.     methocarbamol (ROBAXIN) 500 MG tablet Take 1 tablet (500 mg total) by mouth every 6 (six) hours as needed for muscle spasms. 40 tablet 1   rosuvastatin (CRESTOR) 40 MG tablet Take 40 mg by mouth at bedtime.     traZODone (DESYREL) 100 MG tablet Take 100 mg by mouth at bedtime.     No current facility-administered medications for this visit.     Past Surgical History:  Procedure Laterality Date   BACK SURGERY     C6-C7   BALLOON DILATION N/A 10/01/2020   Procedure: BALLOON DILATION;  Surgeon: Eloise Harman, DO;  [facility];  Service: Endoscopy;  Laterality: N/A;   BIOPSY  10/01/2020   Procedure: BIOPSY;  Surgeon: Eloise Harman, DO;  [facility];  Service: Endoscopy;;  gastric   COLONOSCOPY WITH PROPOFOL N/A 10/01/2020   Procedure: COLONOSCOPY WITH PROPOFOL;  Surgeon: Eloise Harman, DO;  [facility];  Service: Endoscopy;  Laterality: N/A;  2:15pm, pt know new time per office   ESOPHAGOGASTRODUODENOSCOPY (EGD) WITH PROPOFOL N/A 10/01/2020   Procedure: ESOPHAGOGASTRODUODENOSCOPY (EGD) WITH PROPOFOL;  Surgeon: Eloise Harman, DO;  [facility];  Service: Endoscopy;  Laterality: N/A;   HERNIA REPAIR     left inguinal   POLYPECTOMY  10/01/2020   Procedure: POLYPECTOMY INTESTINAL;   Surgeon: Eloise Harman, DO;  [facility];  Service: Endoscopy;;   STRABISMUS SURGERY Left 10/25/2020   Procedure: STRABISMUS REPAIR LEFT EYE;  Surgeon: Everitt Amber, MD;  Location: [facility];  Service: Ophthalmology;  Laterality: Left;   TONSILLECTOMY     TOTAL KNEE ARTHROPLASTY Right 06/12/2022   Procedure: RIGHT TOTAL KNEE ARTHROPLASTY;  Surgeon: [name], MD;  [facility];  Service: Orthopedics;  Laterality: Right;  Allergies  Allergen Reactions   Lorazepam Anxiety      Family History  Problem Relation Age of Onset   Cancer Mother        bladder   Diabetes Father    Cancer Sister        breast    Colon cancer Neg Hx      Social History Nicolas Berry reports that he has been smoking cigarettes. He started smoking about 62 years ago. He has a 90.00 pack-year smoking history. He has never used smokeless tobacco. Nicolas Berry reports current alcohol use.   Review of Systems CONSTITUTIONAL: No weight loss, fever, chills, weakness or fatigue.  HEENT: Eyes: No visual loss, blurred vision, double vision or yellow sclerae.No hearing loss, sneezing, congestion, runny nose or sore throat.  SKIN: No rash or itching.  CARDIOVASCULAR: per hpi RESPIRATORY: No shortness of breath, cough or sputum.  GASTROINTESTINAL: No anorexia, nausea, vomiting or diarrhea. No abdominal pain or blood.  GENITOURINARY: No burning on urination, no polyuria NEUROLOGICAL: No headache, dizziness, syncope, paralysis, ataxia, numbness or tingling in the extremities. No change in bowel or bladder control.  MUSCULOSKELETAL: No muscle, back pain, joint pain or stiffness.  LYMPHATICS: No enlarged nodes. No history of splenectomy.  PSYCHIATRIC: No history of depression or anxiety.  ENDOCRINOLOGIC: No reports of sweating, cold or heat intolerance. No polyuria or polydipsia.  Marland Kitchen   Physical Examination Today's Vitals   11/13/22 1046  BP: 120/70  Pulse: 72   SpO2: 99%  Weight: 148 lb 9.6 oz (67.4 kg)  Height: 5' 10.5" (1.791 m)   Body mass index is 21.02 kg/m.  Gen: resting comfortably, no acute distress HEENT: no scleral icterus, pupils equal round and reactive, no palptable cervical adenopathy,  CV: RRR, no m/rg, no jvd Resp: Clear to auscultation bilaterally GI: abdomen is soft, non-tender, non-distended, normal bowel sounds, no hepatosplenomegaly MSK: extremities are warm, no edema.  Skin: warm, no rash Neuro:  no focal deficits Psych: appropriate affect   Diagnostic Studies  04/2016 echo Study Conclusions   - Left ventricle: Turbulent flow through LVOT. Systolic anterior   motio of MV leaflets Peak gradient through LV/LVOT /AV is 15 mm   Hg at rest No signifiant obstruction at rest. The cavity size was   normal. Wall thickness was increased in a pattern of mild LVH.   Systolic function was vigorous. The estimated ejection fraction   was in the range of 65% to 70%. Doppler parameters are consistent   with abnormal left ventricular relaxation (grade 1 diastolic   dysfunction). - Aortic valve: AV is thickened, calcified with minimally   restricted motion. - Mitral valve: SAM of mitral valve leaflets. Calcified annulus.   Mildly thickened leaflets . - Left atrium: The atrium was mildly dilated. - Pulmonary arteries: PA peak pressure: 32 mm Hg (S).   04/2016 CT PE IMPRESSION: 1. No evidence of acute pulmonary embolism. 2. Mild cardiomegaly. Left ventricular hypertrophy. Mild aortic valvular calcification. 3. Moderate three-vessel coronary atherosclerosis and mild aortic atherosclerosis. 4. Mild circumferential wall thickening involving the upper thoracic esophagus and of the distal esophagus. Query esophagitis as no distinct mass is identified. Upper endoscopy may be confirmatory. 5. COPD/emphysema.  No acute cardiopulmonary disease.       04/2016 Nuclear stress There was no ST segment deviation noted during  stress. No T wave inversion was noted during stress. The study is normal. This is a low risk study. The left ventricular ejection fraction is hyperdynamic (>65%).  01/2018 ABIs FINDINGS: Right ABI:  0.98   Left ABI:  0.92   Right Lower Extremity:  Normal arterial waveforms at the ankle.   Left Lower Extremity:  Normal arterial waveforms at the ankle.   IMPRESSION: 1. Borderline normal bilateral ABIs. Consider postexercise testing to exclude occult arterial occlusive disease.     07/2020 echo IMPRESSIONS     1. Left ventricular ejection fraction, by estimation, is 60 to 65%. The  left ventricle has normal function. The left ventricle has no regional  wall motion abnormalities. There is moderate left ventricular hypertrophy.  Left ventricular diastolic  parameters were normal.   2. Right ventricular systolic function is normal. The right ventricular  size is normal. There is normal pulmonary artery systolic pressure.   3. Left atrial size was mildly dilated.   4. Chordal SAM without LVOT gradient. The mitral valve is degenerative.  Mild mitral valve regurgitation. No evidence of mitral stenosis. Severe  mitral annular calcification.   5. The aortic valve is tricuspid. There is moderate calcification of the  aortic valve. There is moderate thickening of the aortic valve. Aortic  valve regurgitation is trivial. Mild aortic valve stenosis.   6. The inferior vena cava is dilated in size with >50% respiratory  variability, suggesting right atrial pressure of 8 mmHg.    08/2022 [facility] echo Incomplete study. Patient refused to continue with scan half way through  due to pain.  Normal left ventricle systolic function, LVEF [id number]  Aortic valve not very well-visualized but appears moderately thickened and  calcified.  Left Ventricle  Left ventricle size is normal. There is mild concentric hypertrophy. Systolic function is normal. EF: 60-65%. Wall motion cannot be accurately  assessed. Unable to assess diastolic function due to incomplete study.   Right Ventricle  Right ventricle was not well visualized. Systolic function is normal.   Left Atrium  Left atrium size is normal.   Right Atrium  Right atrium was not well visualized.   Mitral Valve  The mitral valve was not well visualized. The leaflets are mildly thickened. There is mild posterior annular calcification. There is mild regurgitation.   Tricuspid Valve  The tricuspid valve was not well visualized. The leaflets exhibit probably normal excursion. There is trace regurgitation. Unable to assess RVSP due to incomplete Doppler signal.   Aortic Valve  The aortic valve was not well visualized. The leaflets are moderately thickened and exhibit at least mildly reduced excursion. The leaflets are severely calcified.   Pulmonic Valve  The pulmonic valve was not well visualized. Trace regurgitation. There is no evidence of pulmonic valve stenosis.   Ascending Aorta  The aortic root is normal in size.   Pericardium  There is no pericardial effusion.   Study Details  A complete echo was performed using complete 2D, color flow Doppler and spectral Doppler. During the study the apical and parasternal views were captured. Overall the study quality was adequate. The imaging is on file and stored in a permanent location.   Assessment and Plan  1. PAF - no symptoms - off anticoag due to recent admission at [facility] with GI bleeding - remains anemic, ongoing GI evaluation as source of bleeding not found - has upcoming appt for watchman eval   2. CAD - noted on CT scan, nuclear stress test shows no significant ischemia - no recent symptoms, continue to monitor.    3.HTN -bp is at goal, continue current meds        Alphonse Guild.  Harl Bowie, M.D.

## 2022-11-16 ENCOUNTER — Encounter (HOSPITAL_COMMUNITY): Payer: Self-pay | Admitting: Nephrology

## 2022-11-16 ENCOUNTER — Ambulatory Visit (INDEPENDENT_AMBULATORY_CARE_PROVIDER_SITE_OTHER): Payer: 59 | Admitting: Urology

## 2022-11-16 ENCOUNTER — Encounter: Payer: Self-pay | Admitting: Urology

## 2022-11-16 ENCOUNTER — Encounter: Payer: Self-pay | Admitting: Hematology

## 2022-11-16 VITALS — BP 157/81 | HR 72

## 2022-11-16 DIAGNOSIS — R32 Unspecified urinary incontinence: Secondary | ICD-10-CM

## 2022-11-16 DIAGNOSIS — N138 Other obstructive and reflux uropathy: Secondary | ICD-10-CM | POA: Diagnosis not present

## 2022-11-16 DIAGNOSIS — R350 Frequency of micturition: Secondary | ICD-10-CM

## 2022-11-16 DIAGNOSIS — N401 Enlarged prostate with lower urinary tract symptoms: Secondary | ICD-10-CM

## 2022-11-16 LAB — BLADDER SCAN AMB NON-IMAGING: Scan Result: 0

## 2022-11-16 MED ORDER — MIRABEGRON ER 25 MG PO TB24: 25.0000 mg | ORAL_TABLET | Freq: Every day | ORAL | 0 refills | Status: DC

## 2022-11-16 NOTE — Patient Instructions (Signed)

## 2022-11-16 NOTE — Progress Notes (Unsigned)
post void residual=0 ?

## 2022-11-16 NOTE — Progress Notes (Unsigned)
11/16/2022 8:48 AM   [name] 06/28/1946 [id number]  Referring provider: Liana Gerold, MD [phone],  [place] [address]  Urinary urgency and frequency   HPI: Mr Rask is a is a 77yo here for evaluation of urinary frequency urgency. He was previously seen 3 years ago for urinary frequency and at the time he was placed on flomax 0.'4mg'$  daily. He stopped the medication. IPSS 10 QOL 3 on finasteride. He was placed on finasteride '5mg'$  by Dr. Jeffie Pollock several years ago. His urinary frequency is every 1-2 hours. Nocturia 1x. He has urge urinary incontinence episodes daily. He denies a feeling of incomplete emptying. Urine stream strong. He was previously on farxiga over a year ago which he stopped. PVR 0cc.    PMH: Past Medical History:  Diagnosis Date   Anemia    Anxiety    OA multiple joints   Arthritis    Atrial fibrillation (HCC)    BPH (benign prostatic hyperplasia)    COPD (chronic obstructive pulmonary disease) (HCC)    uses inhaler   Coronary artery disease    DDD (degenerative disc disease), lumbar    Diabetes mellitus without complication ([place])    Dyspnea    Fatty liver    GERD (gastroesophageal reflux disease)    Headache    Hyperlipidemia    Hypertension     Surgical History: Past Surgical History:  Procedure Laterality Date   BACK SURGERY     C6-C7   BALLOON DILATION N/A 10/01/2020   Procedure: BALLOON DILATION;  Surgeon: Eloise Harman, DO;  [facility];  Service: Endoscopy;  Laterality: N/A;   BIOPSY  10/01/2020   Procedure: BIOPSY;  Surgeon: Eloise Harman, DO;  [facility];  Service: Endoscopy;;  gastric   COLONOSCOPY WITH PROPOFOL N/A 10/01/2020   Procedure: COLONOSCOPY WITH PROPOFOL;  Surgeon: Eloise Harman, DO;  [facility];  Service: Endoscopy;  Laterality: N/A;  2:15pm, pt know new time per office   ESOPHAGOGASTRODUODENOSCOPY (EGD) WITH PROPOFOL N/A 10/01/2020   Procedure:  ESOPHAGOGASTRODUODENOSCOPY (EGD) WITH PROPOFOL;  Surgeon: Eloise Harman, DO;  [facility];  Service: Endoscopy;  Laterality: N/A;   HERNIA REPAIR     left inguinal   POLYPECTOMY  10/01/2020   Procedure: POLYPECTOMY INTESTINAL;  Surgeon: Eloise Harman, DO;  [facility];  Service: Endoscopy;;   STRABISMUS SURGERY Left 10/25/2020   Procedure: STRABISMUS REPAIR LEFT EYE;  Surgeon: Everitt Amber, MD;  Location: [facility];  Service: Ophthalmology;  Laterality: Left;   TONSILLECTOMY     TOTAL KNEE ARTHROPLASTY Right 06/12/2022   Procedure: RIGHT TOTAL KNEE ARTHROPLASTY;  Surgeon: [name], MD;  [facility];  Service: Orthopedics;  Laterality: Right;    Home Medications:  Allergies as of 11/16/2022       Reactions   Lorazepam Anxiety        Medication List        Accurate as of November 16, 2022  8:48 AM. If you have any questions, ask your nurse or doctor.          albuterol 108 (90 Base) MCG/ACT inhaler Commonly known as: VENTOLIN HFA Inhale 2 puffs into the lungs every 4 (four) hours as needed for wheezing or shortness of breath.   BIOFREEZE EX Apply 1 Application topically at bedtime.   carvedilol 3.125 MG tablet Commonly known as: COREG TAKE 1 TABLET BY MOUTH TWICE DAILY STOPPING  CARDIZEM   Cholecalciferol 25 MCG (1000 UT) tablet Take 1,000 Units by mouth daily.   DULoxetine 60 MG capsule Commonly known as: CYMBALTA Take 60 mg by mouth every evening.   famotidine 20 MG tablet Commonly known as: PEPCID Take 20 mg by mouth daily.   Farxiga 5 MG Tabs tablet Generic drug: dapagliflozin propanediol Take 5 mg by mouth daily.   ferrous sulfate 325 (65 FE) MG tablet Take 325 mg by mouth daily with breakfast.   finasteride 5 MG tablet Commonly known as: PROSCAR Take 5 mg by mouth daily.   fluticasone 50 MCG/ACT nasal spray Commonly known as: FLONASE Place 1 spray into both nostrils daily as needed  for allergies or rhinitis.   Fluticasone-Salmeterol 250-50 MCG/DOSE Aepb Commonly known as: ADVAIR Inhale 1 puff into the lungs 2 (two) times daily as needed (shortness of breath).   gabapentin 300 MG capsule Commonly known as: NEURONTIN Take 300 mg by mouth 3 (three) times daily.   HYDROcodone-acetaminophen 5-325 MG tablet Commonly known as: NORCO/VICODIN Take 1-2 tablets by mouth 3 (three) times daily as needed for moderate pain.   losartan 25 MG tablet Commonly known as: COZAAR Take 25 mg by mouth daily.   metFORMIN 500 MG tablet Commonly known as: GLUCOPHAGE Take 500 mg by mouth 2 (two) times daily with a meal.   rosuvastatin 40 MG tablet Commonly known as: CRESTOR Take 40 mg by mouth at bedtime.   traZODone 100 MG tablet Commonly known as: DESYREL Take 100 mg by mouth at bedtime.        Allergies:  Allergies  Allergen Reactions   Lorazepam Anxiety    Family History: Family History  Problem Relation Age of Onset   Cancer Mother        bladder   Diabetes Father    Cancer Sister        breast    Colon cancer Neg Hx     Social History:  reports that he has been smoking cigarettes. He started smoking about 62 years ago. He has a 90.00 pack-year smoking history. He has never used smokeless tobacco. He reports current alcohol use. He reports that he does not use drugs.  ROS: All other review of systems were reviewed and are negative except what is noted above in HPI  Physical Exam: BP (!) 157/81   Pulse 72   Constitutional:  Alert and oriented, No acute distress. HEENT: [place] AT, moist mucus membranes.  Trachea midline, no masses. Cardiovascular: No clubbing, cyanosis, or edema. Respiratory: Normal respiratory effort, no increased work of breathing. GI: Abdomen is soft, nontender, nondistended, no abdominal masses GU: No CVA tenderness. Circumcised phallus. No masses/lesions on penis, testis, scrotum. Prostate 40g smooth no nodules no induration.  Lymph: No  cervical or inguinal lymphadenopathy. Skin: No rashes, bruises or suspicious lesions. Neurologic: Grossly intact, no focal deficits, moving all 4 extremities. Psychiatric: Normal mood and affect.  Laboratory Data: Lab Results  Component Value Date   WBC 6.3 10/13/2022   HGB 9.5 (L) 10/13/2022   HCT 32.9 (L) 10/13/2022   MCV 102.5 (H) 10/13/2022   PLT 189 10/13/2022    Lab Results  Component Value Date   CREATININE 1.13 10/13/2022    No results found for: "PSA"  No results found for: "TESTOSTERONE"  Lab Results  Component Value Date   HGBA1C 5.3 06/05/2022    Urinalysis    Component Value Date/Time   COLORURINE YELLOW 05/17/2016 2000   APPEARANCEUR CLEAR 05/17/2016 2000  LABSPEC 1.020 05/17/2016 2000   PHURINE 5.5 05/17/2016 2000   GLUCOSEU NEGATIVE 05/17/2016 2000   HGBUR NEGATIVE 05/17/2016 2000   BILIRUBINUR neg 01/03/2020 1348   KETONESUR TRACE (A) 05/17/2016 2000   PROTEINUR Positive (A) 01/03/2020 1348   PROTEINUR NEGATIVE 05/17/2016 2000   UROBILINOGEN negative (A) 01/03/2020 1348   UROBILINOGEN 0.2 08/05/2012 0957   NITRITE positive 01/03/2020 1348   NITRITE NEGATIVE 05/17/2016 2000   LEUKOCYTESUR Small (1+) (A) 01/03/2020 1348    No results found for: "LABMICR", "WBCUA", "RBCUA", "LABEPIT", "MUCUS", "BACTERIA"  Pertinent Imaging:  No results found for this or any previous visit.  No results found for this or any previous visit.  No results found for this or any previous visit.  No results found for this or any previous visit.  Results for orders placed during the hospital encounter of 04/10/21  US RENAL  Narrative CLINICAL DATA:  Chronic kidney disease  EXAM: RENAL / URINARY TRACT ULTRASOUND COMPLETE  COMPARISON:  CT abdomen pelvis 12/20/2019  FINDINGS: Right Kidney:  Renal measurements: 11.2 x 4.9 x 6.1 cm = volume: 175 mL. Echogenicity within normal limits. No mass or hydronephrosis visualized.  Left Kidney:  Renal  measurements: 10.8 x 6.0 x 5.7 cm = volume: 194 mL. Echogenicity within normal limits. No mass or hydronephrosis visualized.  Bladder:  Appears normal for degree of bladder distention.  Other:  Diffuse increased echogenicity of the visualized portions of the hepatic parenchyma are a nonspecific indicator of hepatocellular dysfunction, most commonly steatosis.  IMPRESSION: No significant sonographic abnormality of the kidneys.   Electronically Signed By: Miachel Roux M.D. On: 04/11/2021 14:14  No valid procedures specified. No results found for this or any previous visit.  No results found for this or any previous visit.   Assessment & Plan:    1. Urinary incontinence, unspecified type -mirabegron '25mg'$  daily - Urinalysis, Routine w reflex microscopic - BLADDER SCAN AMB NON-IMAGING  2. Benign prostatic hyperplasia with urinary obstruction -finasteride '5mg'$  daily  3. Urinary frequency -mirabergron '25mg'$  daily   No follow-ups on file.  Nicolette Bang, MD  [facility] [place]

## 2022-11-17 LAB — URINALYSIS, ROUTINE W REFLEX MICROSCOPIC
Bilirubin, UA: NEGATIVE
Glucose, UA: NEGATIVE
Ketones, UA: NEGATIVE
Leukocytes,UA: NEGATIVE
Nitrite, UA: NEGATIVE
Specific Gravity, UA: <1.005 — ABNORMAL LOW (ref 1.005–1.030)
Urobilinogen, Ur: 1 mg/dL (ref 0.2–1.0)
pH, UA: 6 (ref 5.0–7.5)

## 2022-11-17 LAB — MICROSCOPIC EXAMINATION: Bacteria, UA: NONE SEEN

## 2022-11-23 NOTE — Progress Notes (Deleted)
NO SHOW FOR LABS - reschedule

## 2022-11-24 ENCOUNTER — Inpatient Hospital Stay: Payer: 59 | Admitting: Physician Assistant

## 2022-11-24 ENCOUNTER — Ambulatory Visit: Payer: No Typology Code available for payment source | Admitting: Physician Assistant

## 2022-11-24 ENCOUNTER — Inpatient Hospital Stay: Payer: 59 | Attending: Hematology

## 2022-11-24 ENCOUNTER — Other Ambulatory Visit: Payer: No Typology Code available for payment source

## 2022-11-24 DIAGNOSIS — Z79899 Other long term (current) drug therapy: Secondary | ICD-10-CM | POA: Diagnosis not present

## 2022-11-24 DIAGNOSIS — D500 Iron deficiency anemia secondary to blood loss (chronic): Secondary | ICD-10-CM | POA: Insufficient documentation

## 2022-11-24 DIAGNOSIS — K922 Gastrointestinal hemorrhage, unspecified: Secondary | ICD-10-CM | POA: Diagnosis not present

## 2022-11-24 DIAGNOSIS — D649 Anemia, unspecified: Secondary | ICD-10-CM

## 2022-11-24 LAB — FERRITIN: Ferritin: 122 ng/mL (ref 24–336)

## 2022-11-24 LAB — CBC WITH DIFFERENTIAL/PLATELET
Abs Immature Granulocytes: 0.04 10*3/uL (ref 0.00–0.07)
Basophils Absolute: 0 10*3/uL (ref 0.0–0.1)
Basophils Relative: 1 %
Eosinophils Absolute: 0.3 10*3/uL (ref 0.0–0.5)
Eosinophils Relative: 4 %
HCT: 43.8 % (ref 39.0–52.0)
Hemoglobin: 13.8 g/dL (ref 13.0–17.0)
Immature Granulocytes: 1 %
Lymphocytes Relative: 30 %
Lymphs Abs: 2.1 10*3/uL (ref 0.7–4.0)
MCH: 31.1 pg (ref 26.0–34.0)
MCHC: 31.5 g/dL (ref 30.0–36.0)
MCV: 98.6 fL (ref 80.0–100.0)
Monocytes Absolute: 0.7 10*3/uL (ref 0.1–1.0)
Monocytes Relative: 10 %
Neutro Abs: 3.8 10*3/uL (ref 1.7–7.7)
Neutrophils Relative %: 54 %
Platelets: 133 10*3/uL — ABNORMAL LOW (ref 150–400)
RBC: 4.44 MIL/uL (ref 4.22–5.81)
RDW: 14.1 % (ref 11.5–15.5)
WBC: 7 10*3/uL (ref 4.0–10.5)
nRBC: 0 % (ref 0.0–0.2)

## 2022-11-24 LAB — IRON AND TIBC
Iron: 81 ug/dL (ref 45–182)
Saturation Ratios: 22 % (ref 17.9–39.5)
TIBC: 362 ug/dL (ref 250–450)
UIBC: 281 ug/dL

## 2022-11-25 ENCOUNTER — Institutional Professional Consult (permissible substitution): Payer: No Typology Code available for payment source | Admitting: Cardiology

## 2022-11-25 NOTE — Progress Notes (Deleted)
Electrophysiology Office Note:    Date:  11/25/2022   ID:  [name], DOB 04-15-1946, MRN [id number]  [place] Cardiologist:  Carlyle Dolly, MD  [facility] Electrophysiologist:  Vickie Epley, MD   Referring MD: Anthoney Harada, NP   Chief Complaint: AF  History of Present Illness:    Nicolas Berry is a 77 y.o. male who I am seeing today for an evaluation of AF at the request of Dr Zandra Abts. He saw Dr Harl Bowie 11/13/2022. He has a history of AF, GI bleeding (hospitalization in 08/2022, 09/2020), CAD, COPD, HTN, HLD.  He is currently OFF anticoagulation.  Presents to discuss Watchman.     Their past medical, social and family history was reveiwed.   ROS:   Please see the history of present illness.    All other systems reviewed and are negative.  EKGs/Labs/Other Studies Reviewed:    The following studies were reviewed today:  07/2020 Echo EF 60 RV normal Mild dilated LA Severe MAC   EKG:  The ekg ordered today demonstrates ***   Physical Exam:    VS:  There were no vitals taken for this visit.    Wt Readings from Last 3 Encounters:  11/13/22 148 lb 9.6 oz (67.4 kg)  10/13/22 142 lb 1.6 oz (64.5 kg)  09/24/22 136 lb (61.7 kg)     GEN: *** Well nourished, well developed in no acute distress CARDIAC: ***RRR, no murmurs, rubs, gallops RESPIRATORY:  Clear to auscultation without rales, wheezing or rhonchi       ASSESSMENT AND PLAN:    No diagnosis found.  #pAF #hx of GI bleeding  Can he tolerate [place]?   ----------------------  I have seen Nicolas Berry in the office today who is being considered for a Watchman left atrial appendage closure device. I believe they will benefit from this procedure given their history of atrial fibrillation, CHA2DS2-VASc score of 5 and unadjusted ischemic stroke rate of 6.7% per year. Unfortunately, the patient is not felt to be a long term anticoagulation candidate secondary to hx of GI bleeding.  The patient's chart has been reviewed and I feel that they would be a candidate for short term oral anticoagulation after Watchman implant.   It is my belief that after undergoing a LAA closure procedure, Nicolas Berry will not need long term anticoagulation which eliminates anticoagulation side effects and major bleeding risk.   Procedural risks for the Watchman implant have been reviewed with the patient including a 0.5% risk of stroke, <1% risk of perforation and <1% risk of device embolization. Other risks include bleeding, vascular damage, tamponade, worsening renal function, and death. The patient understands these risk and wishes to proceed.     The published clinical data on the safety and effectiveness of WATCHMAN include but are not limited to the following: - Holmes DR, Mechele Claude, Sick P et al. for the PROTECT AF Investigators. Percutaneous closure of the left atrial appendage versus warfarin therapy for prevention of stroke in patients with atrial fibrillation: a randomised non-inferiority trial. Lancet 2009; 374: 534-42. Mechele Claude, Doshi SK, Abelardo Diesel D et al. on behalf of the PROTECT AF Investigators. Percutaneous Left Atrial Appendage Closure for Stroke Prophylaxis in Patients With Atrial Fibrillation 2.3-Year Follow-up of the PROTECT AF (Watchman Left Atrial Appendage System for Embolic Protection in Patients With Atrial Fibrillation) Trial. Circulation 2013; 127:720-729. - Alli O, Doshi S,  Kar S, Reddy VY, Sievert H et al. Quality of Life Assessment in  the Randomized PROTECT AF (Percutaneous Closure of the Left Atrial Appendage Versus Warfarin Therapy for Prevention of Stroke in Patients With Atrial Fibrillation) Trial of Patients at Risk for Stroke With Nonvalvular Atrial Fibrillation. J Am Coll Cardiol 2013; N8865744. Vertell Limber DR, Tarri Abernethy, Price M, Brownell, Sievert H, Doshi S, Huber K, Reddy V. Prospective randomized evaluation of the Watchman left atrial appendage Device  in patients with atrial fibrillation versus long-term warfarin therapy; the PREVAIL trial. Journal of the [employer] of Cardiology, Vol. 4, No. 1, 2014, 1-11. - Kar S, Doshi SK, Sadhu A, Horton R, Osorio J et al. Primary outcome evaluation of a next-generation left atrial appendage closure device: results from the PINNACLE FLX trial. Circulation 2021;143(18)1754-1762.    After today's visit with the patient which was dedicated solely for shared decision making visit regarding LAA closure device, the patient decided to proceed with the LAA appendage closure procedure scheduled to be done in the near future at [facility]. Prior to the procedure, I would like to obtain a gated CT scan of the chest with contrast timed for PV/LA visualization.   Additionally, the patient will need an updated echo.  HAS-BLED score 3 Hypertension Yes  Abnormal renal and liver function (Dialysis, transplant, Cr >2.26 mg/dL /Cirrhosis or Bilirubin >2x Normal or AST/ALT/AP >3x Normal) No  Stroke No  Bleeding Yes  Labile INR (Unstable/high INR) No  Elderly (>65) Yes  Drugs or alcohol (? 8 drinks/week, anti-plt or NSAID) No   CHA2DS2-VASc Score = 5  The patient's score is based upon: CHF History: 0 HTN History: 1 Diabetes History: 1 Stroke History: 0 Vascular Disease History: 1 Age Score: 2 Gender Score: 0         Signed, Thekla Colborn T. Quentin Ore, MD, [facility], [facility] 11/25/2022 5:42 AM    Electrophysiology [facility]

## 2022-12-04 ENCOUNTER — Encounter: Payer: Self-pay | Admitting: Hematology

## 2022-12-04 ENCOUNTER — Encounter (HOSPITAL_COMMUNITY): Payer: Self-pay | Admitting: Nephrology

## 2022-12-07 DIAGNOSIS — I5032 Chronic diastolic (congestive) heart failure: Secondary | ICD-10-CM | POA: Diagnosis not present

## 2022-12-07 DIAGNOSIS — R809 Proteinuria, unspecified: Secondary | ICD-10-CM | POA: Diagnosis not present

## 2022-12-07 DIAGNOSIS — I129 Hypertensive chronic kidney disease with stage 1 through stage 4 chronic kidney disease, or unspecified chronic kidney disease: Secondary | ICD-10-CM | POA: Diagnosis not present

## 2022-12-07 DIAGNOSIS — E1122 Type 2 diabetes mellitus with diabetic chronic kidney disease: Secondary | ICD-10-CM | POA: Diagnosis not present

## 2022-12-07 DIAGNOSIS — N189 Chronic kidney disease, unspecified: Secondary | ICD-10-CM | POA: Diagnosis not present

## 2022-12-10 ENCOUNTER — Other Ambulatory Visit (HOSPITAL_COMMUNITY): Payer: Self-pay | Admitting: Nurse Practitioner

## 2022-12-10 DIAGNOSIS — R1310 Dysphagia, unspecified: Secondary | ICD-10-CM

## 2022-12-11 DIAGNOSIS — D696 Thrombocytopenia, unspecified: Secondary | ICD-10-CM | POA: Diagnosis not present

## 2022-12-11 DIAGNOSIS — R809 Proteinuria, unspecified: Secondary | ICD-10-CM | POA: Diagnosis not present

## 2022-12-11 DIAGNOSIS — N189 Chronic kidney disease, unspecified: Secondary | ICD-10-CM | POA: Diagnosis not present

## 2022-12-11 DIAGNOSIS — I129 Hypertensive chronic kidney disease with stage 1 through stage 4 chronic kidney disease, or unspecified chronic kidney disease: Secondary | ICD-10-CM | POA: Diagnosis not present

## 2022-12-11 DIAGNOSIS — E1129 Type 2 diabetes mellitus with other diabetic kidney complication: Secondary | ICD-10-CM | POA: Diagnosis not present

## 2022-12-11 DIAGNOSIS — I5032 Chronic diastolic (congestive) heart failure: Secondary | ICD-10-CM | POA: Diagnosis not present

## 2022-12-11 DIAGNOSIS — E1122 Type 2 diabetes mellitus with diabetic chronic kidney disease: Secondary | ICD-10-CM | POA: Diagnosis not present

## 2022-12-11 DIAGNOSIS — D508 Other iron deficiency anemias: Secondary | ICD-10-CM | POA: Diagnosis not present

## 2022-12-14 ENCOUNTER — Ambulatory Visit (HOSPITAL_COMMUNITY)
Admission: RE | Admit: 2022-12-14 | Discharge: 2022-12-14 | Disposition: A | Discharge status: Home / Self Care | Payer: No Typology Code available for payment source | Source: Ambulatory Visit | Attending: Nurse Practitioner | Admitting: Nurse Practitioner

## 2022-12-14 DIAGNOSIS — R1310 Dysphagia, unspecified: Secondary | ICD-10-CM | POA: Insufficient documentation

## 2022-12-30 ENCOUNTER — Ambulatory Visit: Payer: No Typology Code available for payment source | Admitting: Urology

## 2022-12-30 DIAGNOSIS — N401 Enlarged prostate with lower urinary tract symptoms: Secondary | ICD-10-CM

## 2022-12-30 DIAGNOSIS — R32 Unspecified urinary incontinence: Secondary | ICD-10-CM

## 2023-01-06 ENCOUNTER — Ambulatory Visit: Payer: No Typology Code available for payment source | Admitting: Orthopaedic Surgery

## 2023-01-25 ENCOUNTER — Encounter: Payer: Self-pay | Admitting: Hematology

## 2023-01-25 ENCOUNTER — Ambulatory Visit: Payer: No Typology Code available for payment source | Admitting: Orthopaedic Surgery

## 2023-01-25 ENCOUNTER — Encounter (HOSPITAL_COMMUNITY): Payer: Self-pay | Admitting: Nephrology

## 2023-02-17 ENCOUNTER — Ambulatory Visit: Payer: 59 | Admitting: Cardiology

## 2023-02-19 ENCOUNTER — Ambulatory Visit (INDEPENDENT_AMBULATORY_CARE_PROVIDER_SITE_OTHER): Payer: 59 | Admitting: Urology

## 2023-02-19 ENCOUNTER — Encounter: Payer: Self-pay | Admitting: Urology

## 2023-02-19 VITALS — BP 142/62 | HR 60

## 2023-02-19 DIAGNOSIS — N138 Other obstructive and reflux uropathy: Secondary | ICD-10-CM

## 2023-02-19 DIAGNOSIS — N401 Enlarged prostate with lower urinary tract symptoms: Secondary | ICD-10-CM | POA: Diagnosis not present

## 2023-02-19 DIAGNOSIS — R32 Unspecified urinary incontinence: Secondary | ICD-10-CM

## 2023-02-19 LAB — URINALYSIS, ROUTINE W REFLEX MICROSCOPIC
Bilirubin, UA: NEGATIVE
Glucose, UA: NEGATIVE
Ketones, UA: NEGATIVE
Leukocytes,UA: NEGATIVE
Nitrite, UA: NEGATIVE
Specific Gravity, UA: 1.025 (ref 1.005–1.030)
Urobilinogen, Ur: 1 mg/dL (ref 0.2–1.0)
pH, UA: 6 (ref 5.0–7.5)

## 2023-02-19 LAB — MICROSCOPIC EXAMINATION: Bacteria, UA: NONE SEEN

## 2023-02-19 MED ORDER — FINASTERIDE 5 MG PO TABS
5.0000 mg | ORAL_TABLET | Freq: Every day | ORAL | 3 refills | Status: AC
Start: 2023-02-19 — End: ?

## 2023-02-19 NOTE — Patient Instructions (Signed)

## 2023-02-19 NOTE — Progress Notes (Signed)
02/19/2023 10:29 AM   Nicolas Berry 01/13/1946 [id number]  Referring provider: Doreatha Massed, MD [address] [place],  [place] [address]  Followup urinary urgency    HPI: Mr Souva is a 76yo here for followup for BPh and urinary incontinence. He was started on mirabegron last visit and noted significant improvement in his incontinence and urinary urgency. Urine stream strong. No straining to urinate. IPSS 10 QOl 1 on finasteride   PMH: Past Medical History:  Diagnosis Date   Anemia    Anxiety    OA multiple joints   Arthritis    Atrial fibrillation (HCC)    BPH (benign prostatic hyperplasia)    COPD (chronic obstructive pulmonary disease) (HCC)    uses inhaler   Coronary artery disease    DDD (degenerative disc disease), lumbar    Diabetes mellitus without complication (HCC)    Dyspnea    Fatty liver    GERD (gastroesophageal reflux disease)    Headache    Hyperlipidemia    Hypertension     Surgical History: Past Surgical History:  Procedure Laterality Date   BACK SURGERY     C6-C7   BALLOON DILATION N/A 10/01/2020   Procedure: BALLOON DILATION;  Surgeon: Lanelle Bal, DO;  [facility];  Service: Endoscopy;  Laterality: N/A;   BIOPSY  10/01/2020   Procedure: BIOPSY;  Surgeon: Lanelle Bal, DO;  [facility];  Service: Endoscopy;;  gastric   COLONOSCOPY WITH PROPOFOL N/A 10/01/2020   Procedure: COLONOSCOPY WITH PROPOFOL;  Surgeon: Lanelle Bal, DO;  [facility];  Service: Endoscopy;  Laterality: N/A;  2:15pm, pt know new time per office   ESOPHAGOGASTRODUODENOSCOPY (EGD) WITH PROPOFOL N/A 10/01/2020   Procedure: ESOPHAGOGASTRODUODENOSCOPY (EGD) WITH PROPOFOL;  Surgeon: Lanelle Bal, DO;  [facility];  Service: Endoscopy;  Laterality: N/A;   HERNIA REPAIR     left inguinal   POLYPECTOMY  10/01/2020   Procedure: POLYPECTOMY INTESTINAL;  Surgeon: Lanelle Bal, DO;  [facility];  Service: Endoscopy;;   STRABISMUS SURGERY Left 10/25/2020   Procedure: STRABISMUS REPAIR LEFT EYE;  Surgeon: Verne Carrow, MD;  Location: [facility];  Service: Ophthalmology;  Laterality: Left;   TONSILLECTOMY     TOTAL KNEE ARTHROPLASTY Right 06/12/2022   Procedure: RIGHT TOTAL KNEE ARTHROPLASTY;  Surgeon: Kathryne Hitch, MD;  Location: [facility];  Service: Orthopedics;  Laterality: Right;    Home Medications:  Allergies as of 02/19/2023       Reactions   Lorazepam Anxiety        Medication List        Accurate as of Feb 19, 2023 10:29 AM. If you have any questions, ask your nurse or doctor.          albuterol 108 (90 Base) MCG/ACT inhaler Commonly known as: VENTOLIN HFA Inhale 2 puffs into the lungs every 4 (four) hours as needed for wheezing or shortness of breath.   BIOFREEZE EX Apply 1 Application topically at bedtime.   carvedilol 3.125 MG tablet Commonly known as: COREG TAKE 1 TABLET BY MOUTH TWICE DAILY STOPPING  CARDIZEM   Cholecalciferol 25 MCG (1000 UT) tablet Take 1,000 Units by mouth daily.   DULoxetine 60 MG capsule Commonly known as: CYMBALTA Take 60 mg by mouth every evening.   famotidine 20 MG tablet Commonly known as: PEPCID Take 20 mg by mouth daily.   Farxiga 5 MG Tabs tablet Generic drug:  dapagliflozin propanediol Take 5 mg by mouth daily.   ferrous sulfate 325 (65 FE) MG tablet Take 325 mg by mouth daily with breakfast.   finasteride 5 MG tablet Commonly known as: PROSCAR Take 5 mg by mouth daily.   fluticasone 50 MCG/ACT nasal spray Commonly known as: FLONASE Place 1 spray into both nostrils daily as needed for allergies or rhinitis.   Fluticasone-Salmeterol 250-50 MCG/DOSE Aepb Commonly known as: ADVAIR Inhale 1 puff into the lungs 2 (two) times daily as needed (shortness of breath).   gabapentin 300 MG capsule Commonly known as: NEURONTIN Take 300 mg by mouth 3 (three) times daily.    HYDROcodone-acetaminophen 5-325 MG tablet Commonly known as: NORCO/VICODIN Take 1-2 tablets by mouth 3 (three) times daily as needed for moderate pain.   losartan 25 MG tablet Commonly known as: COZAAR Take 25 mg by mouth daily.   metFORMIN 500 MG tablet Commonly known as: GLUCOPHAGE Take 500 mg by mouth 2 (two) times daily with a meal.   mirabegron ER 25 MG Tb24 tablet Commonly known as: MYRBETRIQ Take 1 tablet (25 mg total) by mouth daily.   rosuvastatin 40 MG tablet Commonly known as: CRESTOR Take 40 mg by mouth at bedtime.   traZODone 100 MG tablet Commonly known as: DESYREL Take 100 mg by mouth at bedtime.        Allergies:  Allergies  Allergen Reactions   Lorazepam Anxiety    Family History: Family History  Problem Relation Age of Onset   Cancer Mother        bladder   Diabetes Father    Cancer Sister        breast    Colon cancer Neg Hx     Social History:  reports that he has been smoking cigarettes. He started smoking about 62 years ago. He has a 90.00 pack-year smoking history. He has never used smokeless tobacco. He reports current alcohol use. He reports that he does not use drugs.  ROS: All other review of systems were reviewed and are negative except what is noted above in HPI  Physical Exam: BP (!) 142/62   Pulse 60   Constitutional:  Alert and oriented, No acute distress. HEENT: [place] AT, moist mucus membranes.  Trachea midline, no masses. Cardiovascular: No clubbing, cyanosis, or edema. Respiratory: Normal respiratory effort, no increased work of breathing. GI: Abdomen is soft, nontender, nondistended, no abdominal masses GU: No CVA tenderness.  Lymph: No cervical or inguinal lymphadenopathy. Skin: No rashes, bruises or suspicious lesions. Neurologic: Grossly intact, no focal deficits, moving all 4 extremities. Psychiatric: Normal mood and affect.  Laboratory Data: Lab Results  Component Value Date   WBC 7.0 11/24/2022   HGB 13.8  11/24/2022   HCT 43.8 11/24/2022   MCV 98.6 11/24/2022   PLT 133 (L) 11/24/2022    Lab Results  Component Value Date   CREATININE 1.13 10/13/2022    No results found for: "PSA"  No results found for: "TESTOSTERONE"  Lab Results  Component Value Date   HGBA1C 5.3 06/05/2022    Urinalysis    Component Value Date/Time   COLORURINE YELLOW 05/17/2016 2000   APPEARANCEUR Clear 11/16/2022 0834   LABSPEC 1.020 05/17/2016 2000   PHURINE 5.5 05/17/2016 2000   GLUCOSEU Negative 11/16/2022 0834   HGBUR NEGATIVE 05/17/2016 2000   BILIRUBINUR Negative 11/16/2022 0834   KETONESUR TRACE (A) 05/17/2016 2000   PROTEINUR 1+ (A) 11/16/2022 0834   PROTEINUR NEGATIVE 05/17/2016 2000   UROBILINOGEN negative (A)  01/03/2020 1348   UROBILINOGEN 0.2 08/05/2012 0957   NITRITE Negative 11/16/2022 0834   NITRITE NEGATIVE 05/17/2016 2000   LEUKOCYTESUR Negative 11/16/2022 0834    Lab Results  Component Value Date   LABMICR See below: 11/16/2022   WBCUA 0-5 11/16/2022   LABEPIT 0-10 11/16/2022   BACTERIA None seen 11/16/2022    Pertinent Imaging:  No results found for this or any previous visit.  No results found for this or any previous visit.  No results found for this or any previous visit.  No results found for this or any previous visit.  Results for orders placed during the hospital encounter of 04/10/21  US RENAL  Narrative CLINICAL DATA:  Chronic kidney disease  EXAM: RENAL / URINARY TRACT ULTRASOUND COMPLETE  COMPARISON:  CT abdomen pelvis 12/20/2019  FINDINGS: Right Kidney:  Renal measurements: 11.2 x 4.9 x 6.1 cm = volume: 175 mL. Echogenicity within normal limits. No mass or hydronephrosis visualized.  Left Kidney:  Renal measurements: 10.8 x 6.0 x 5.7 cm = volume: 194 mL. Echogenicity within normal limits. No mass or hydronephrosis visualized.  Bladder:  Appears normal for degree of bladder distention.  Other:  Diffuse increased echogenicity of  the visualized portions of the hepatic parenchyma are a nonspecific indicator of hepatocellular dysfunction, most commonly steatosis.  IMPRESSION: No significant sonographic abnormality of the kidneys.   Electronically Signed By: Acquanetta Belling M.D. On: 04/11/2021 14:14  No valid procedures specified. No results found for this or any previous visit.  No results found for this or any previous visit.   Assessment & Plan:    1. Urinary incontinence, unspecified type -increase mirabegron 50mg  daily. Patient to call if he desires 25mg  versus 50mg  - Urinalysis, Routine w reflex microscopic  2. Benign prostatic hyperplasia with urinary obstruction -continue finasteride 5mg  - Urinalysis, Routine w reflex microscopic   No follow-ups on file.  Wilkie Aye, MD  [facility]

## 2023-02-25 ENCOUNTER — Ambulatory Visit: Payer: 59 | Attending: Cardiology | Admitting: Cardiology

## 2023-02-25 VITALS — BP 120/60 | HR 66 | Ht 70.0 in | Wt 151.0 lb

## 2023-02-25 DIAGNOSIS — Z79899 Other long term (current) drug therapy: Secondary | ICD-10-CM | POA: Diagnosis not present

## 2023-02-25 DIAGNOSIS — I480 Paroxysmal atrial fibrillation: Secondary | ICD-10-CM | POA: Diagnosis not present

## 2023-02-25 DIAGNOSIS — I10 Essential (primary) hypertension: Secondary | ICD-10-CM

## 2023-02-25 MED ORDER — APIXABAN 5 MG PO TABS: 5.0000 mg | ORAL_TABLET | Freq: Two times a day (BID) | ORAL | 6 refills | Status: DC

## 2023-02-25 MED ORDER — MECLIZINE HCL 25 MG PO TABS: 25.0000 mg | ORAL_TABLET | Freq: Three times a day (TID) | ORAL | 0 refills | Status: AC | PRN

## 2023-02-25 NOTE — Progress Notes (Signed)
Clinical Summary Nicolas Berry is a 77 y.o.male seen today for follow up of the following medical problems.    1. Afib/PAF - previously seen in afib clinic - rate control with coreg, eliquis for stroke prevention.     - recent issues with anemia and heme + stools, has been off eliquis -  GI work up with EGD with gastritis, and colonscopy polyps no source of bleeding    - no recent palpitations - lot of other medical issues has been dealing with, wanted to hold off on watchman evaluation and he cancelled the appointment      2. History of GI bleed - recently admitted [facility] 09/10/2022 thru 09/12/2022 for acute blood loss anemia and melena  - He had a GIB episode last year 09/2020 but bidirectional endoscopy work up was unrevealing for active bleeding source. He was noted to have chronic active gastritis positive for intestinal metaplasia. Repeat EGD at [facility] 12/22 revealed wide open Schatzki's ring, mild non erosive antral gastropathy (negative for HP), normal visualized duodenum, but no blood or bleeding lesions seen throughout. Follow up colonoscopy 12/23 revealed few non-advanced adenomas, old melena in the colon but no active bleeding. Terminal ileum was not able to be intubated despite multiple attempts. He was suspected of having a small bowel bleed vs. right sided diverticular bleed.    - looking to estbalish more local to f/u with GI  -awaiting pill endoscopy, [facility] and [facility] - no visual signs of bleeding, last Hgb was 15.    2. CAD - incidental finding on recent CT PE - 04/2016 nuclear stress test no ischemia  -no specific chest pains.      3. Esophageal thickening - incidental finding on CT scan - followed by pcp and GI       4. COPD - emphsymatous changes noted on recent CT PE - followed by pcp     5. Leg pains - occurs at rest. Occurs in thighs and calves.  - can be worst with walking.   01/2018 ABI right 0.98, left 0.92     6.  HTN - he is compliant with meds     7. Hyperlipidemia - labs followed by [place] - he is on statin   8.CKD - followed by Dr Wolfgang Phoenix       9. Valvular heart disease - 07/2020 echo LVEF 60-65%, no WMAs, mild MR, very mild AS mean grad 9, AVA VTI 2.64 08/2022 LVEF 60-65%, mild MR, thickened AV but no stenosis    10. Carotid bruits - Jan 2023 no significant stenosis Past Medical History:  Diagnosis Date   Anemia    Anxiety    OA multiple joints   Arthritis    Atrial fibrillation (HCC)    BPH (benign prostatic hyperplasia)    COPD (chronic obstructive pulmonary disease) (HCC)    uses inhaler   Coronary artery disease    DDD (degenerative disc disease), lumbar    Diabetes mellitus without complication (HCC)    Dyspnea    Fatty liver    GERD (gastroesophageal reflux disease)    Headache    Hyperlipidemia    Hypertension      Allergies  Allergen Reactions   Lorazepam Anxiety     Current Outpatient Medications  Medication Sig Dispense Refill   albuterol (VENTOLIN HFA) 108 (90 Base) MCG/ACT inhaler Inhale 2 puffs into the lungs every 4 (four) hours as needed for wheezing or shortness of breath.  carvedilol (COREG) 3.125 MG tablet TAKE 1 TABLET BY MOUTH TWICE DAILY STOPPING  CARDIZEM 180 tablet 3   Cholecalciferol 25 MCG (1000 UT) tablet Take 1,000 Units by mouth daily.     DULoxetine (CYMBALTA) 60 MG capsule Take 60 mg by mouth every evening.     famotidine (PEPCID) 20 MG tablet Take 20 mg by mouth daily.     ferrous sulfate 325 (65 FE) MG tablet Take 325 mg by mouth daily with breakfast. (Patient not taking: Reported on 11/13/2022)     finasteride (PROSCAR) 5 MG tablet Take 1 tablet (5 mg total) by mouth daily. 90 tablet 3   fluticasone (FLONASE) 50 MCG/ACT nasal spray Place 1 spray into both nostrils daily as needed for allergies or rhinitis.     Fluticasone-Salmeterol (ADVAIR) 250-50 MCG/DOSE AEPB Inhale 1 puff into the lungs 2 (two) times daily as needed (shortness of  breath).     gabapentin (NEURONTIN) 300 MG capsule Take 300 mg by mouth 3 (three) times daily.  1   losartan (COZAAR) 25 MG tablet Take 25 mg by mouth daily.     Menthol, Topical Analgesic, (BIOFREEZE EX) Apply 1 Application topically at bedtime.     metFORMIN (GLUCOPHAGE) 500 MG tablet Take 500 mg by mouth 2 (two) times daily with a meal.     mirabegron ER (MYRBETRIQ) 25 MG TB24 tablet Take 1 tablet (25 mg total) by mouth daily. (Patient not taking: Reported on 02/19/2023) 30 tablet 0   rosuvastatin (CRESTOR) 40 MG tablet Take 40 mg by mouth at bedtime.     traZODone (DESYREL) 100 MG tablet Take 100 mg by mouth at bedtime.     No current facility-administered medications for this visit.     Past Surgical History:  Procedure Laterality Date   BACK SURGERY     C6-C7   BALLOON DILATION N/A 10/01/2020   Procedure: BALLOON DILATION;  Surgeon: Lanelle Bal, DO;  [facility];  Service: Endoscopy;  Laterality: N/A;   BIOPSY  10/01/2020   Procedure: BIOPSY;  Surgeon: Lanelle Bal, DO;  [facility];  Service: Endoscopy;;  gastric   COLONOSCOPY WITH PROPOFOL N/A 10/01/2020   Procedure: COLONOSCOPY WITH PROPOFOL;  Surgeon: Lanelle Bal, DO;  [facility];  Service: Endoscopy;  Laterality: N/A;  2:15pm, pt know new time per office   ESOPHAGOGASTRODUODENOSCOPY (EGD) WITH PROPOFOL N/A 10/01/2020   Procedure: ESOPHAGOGASTRODUODENOSCOPY (EGD) WITH PROPOFOL;  Surgeon: Lanelle Bal, DO;  [facility];  Service: Endoscopy;  Laterality: N/A;   HERNIA REPAIR     left inguinal   POLYPECTOMY  10/01/2020   Procedure: POLYPECTOMY INTESTINAL;  Surgeon: Lanelle Bal, DO;  [facility];  Service: Endoscopy;;   STRABISMUS SURGERY Left 10/25/2020   Procedure: STRABISMUS REPAIR LEFT EYE;  Surgeon: Verne Carrow, MD;  Location: [facility];  Service: Ophthalmology;  Laterality: Left;   TONSILLECTOMY     TOTAL KNEE ARTHROPLASTY  Right 06/12/2022   Procedure: RIGHT TOTAL KNEE ARTHROPLASTY;  Surgeon: Kathryne Hitch, MD;  Location: [facility];  Service: Orthopedics;  Laterality: Right;     Allergies  Allergen Reactions   Lorazepam Anxiety      Family History  Problem Relation Age of Onset   Cancer Mother        bladder   Diabetes Father    Cancer Sister        breast    Colon cancer Neg Hx  Social History Mr. Scism reports that he has been smoking cigarettes. He started smoking about 62 years ago. He has a 90.00 pack-year smoking history. He has never used smokeless tobacco. Mr. Mckinney reports current alcohol use.   Review of Systems CONSTITUTIONAL: No weight loss, fever, chills, weakness or fatigue.  HEENT: Eyes: No visual loss, blurred vision, double vision or yellow sclerae.No hearing loss, sneezing, congestion, runny nose or sore throat.  SKIN: No rash or itching.  CARDIOVASCULAR: per hpi RESPIRATORY: No shortness of breath, cough or sputum.  GASTROINTESTINAL: No anorexia, nausea, vomiting or diarrhea. No abdominal pain or blood.  GENITOURINARY: No burning on urination, no polyuria NEUROLOGICAL: No headache, dizziness, syncope, paralysis, ataxia, numbness or tingling in the extremities. No change in bowel or bladder control.  MUSCULOSKELETAL: No muscle, back pain, joint pain or stiffness.  LYMPHATICS: No enlarged nodes. No history of splenectomy.  PSYCHIATRIC: No history of depression or anxiety.  ENDOCRINOLOGIC: No reports of sweating, cold or heat intolerance. No polyuria or polydipsia.  Marland Kitchen   Physical Examination Today's Vitals   02/25/23 1347  BP: 120/60  Pulse: 66  SpO2: 96%  Weight: 151 lb (68.5 kg)  Height: 5\' 10"  (1.778 m)   Body mass index is 21.67 kg/m.  Gen: resting comfortably, no acute distress HEENT: no scleral icterus, pupils equal round and reactive, no palptable cervical adenopathy,  CV: RRR, 2/6 systolic murmur apex Resp: Clear to auscultation  bilaterally GI: abdomen is soft, non-tender, non-distended, normal bowel sounds, no hepatosplenomegaly MSK: extremities are warm, no edema.  Skin: warm, no rash Neuro:  no focal deficits Psych: appropriate affect   Diagnostic Studies  04/2016 echo Study Conclusions   - Left ventricle: Turbulent flow through LVOT. Systolic anterior   motio of MV leaflets Peak gradient through LV/LVOT /AV is 15 mm   Hg at rest No signifiant obstruction at rest. The cavity size was   normal. Wall thickness was increased in a pattern of mild LVH.   Systolic function was vigorous. The estimated ejection fraction   was in the range of 65% to 70%. Doppler parameters are consistent   with abnormal left ventricular relaxation (grade 1 diastolic   dysfunction). - Aortic valve: AV is thickened, calcified with minimally   restricted motion. - Mitral valve: SAM of mitral valve leaflets. Calcified annulus.   Mildly thickened leaflets . - Left atrium: The atrium was mildly dilated. - Pulmonary arteries: PA peak pressure: 32 mm Hg (S).   04/2016 CT PE IMPRESSION: 1. No evidence of acute pulmonary embolism. 2. Mild cardiomegaly. Left ventricular hypertrophy. Mild aortic valvular calcification. 3. Moderate three-vessel coronary atherosclerosis and mild aortic atherosclerosis. 4. Mild circumferential wall thickening involving the upper thoracic esophagus and of the distal esophagus. Query esophagitis as no distinct mass is identified. Upper endoscopy may be confirmatory. 5. COPD/emphysema.  No acute cardiopulmonary disease.       04/2016 Nuclear stress There was no ST segment deviation noted during stress. No T wave inversion was noted during stress. The study is normal. This is a low risk study. The left ventricular ejection fraction is hyperdynamic (>65%).     01/2018 ABIs FINDINGS: Right ABI:  0.98   Left ABI:  0.92   Right Lower Extremity:  Normal arterial waveforms at the ankle.   Left Lower  Extremity:  Normal arterial waveforms at the ankle.   IMPRESSION: 1. Borderline normal bilateral ABIs. Consider postexercise testing to exclude occult arterial occlusive disease.     07/2020 echo IMPRESSIONS  1. Left ventricular ejection fraction, by estimation, is 60 to 65%. The  left ventricle has normal function. The left ventricle has no regional  wall motion abnormalities. There is moderate left ventricular hypertrophy.  Left ventricular diastolic  parameters were normal.   2. Right ventricular systolic function is normal. The right ventricular  size is normal. There is normal pulmonary artery systolic pressure.   3. Left atrial size was mildly dilated.   4. Chordal SAM without LVOT gradient. The mitral valve is degenerative.  Mild mitral valve regurgitation. No evidence of mitral stenosis. Severe  mitral annular calcification.   5. The aortic valve is tricuspid. There is moderate calcification of the  aortic valve. There is moderate thickening of the aortic valve. Aortic  valve regurgitation is trivial. Mild aortic valve stenosis.   6. The inferior vena cava is dilated in size with >50% respiratory  variability, suggesting right atrial pressure of 8 mmHg.    08/2022 [facility] echo Incomplete study. Patient refused to continue with scan half way through  due to pain.  Normal left ventricle systolic function, LVEF 60-65%  Aortic valve not very well-visualized but appears moderately thickened and  calcified.  Left Ventricle  Left ventricle size is normal. There is mild concentric hypertrophy. Systolic function is normal. EF: 60-65%. Wall motion cannot be accurately assessed. Unable to assess diastolic function due to incomplete study.   Right Ventricle  Right ventricle was not well visualized. Systolic function is normal.   Left Atrium  Left atrium size is normal.   Right Atrium  Right atrium was not well visualized.   Mitral Valve  The mitral valve was not well  visualized. The leaflets are mildly thickened. There is mild posterior annular calcification. There is mild regurgitation.   Tricuspid Valve  The tricuspid valve was not well visualized. The leaflets exhibit probably normal excursion. There is trace regurgitation. Unable to assess RVSP due to incomplete Doppler signal.   Aortic Valve  The aortic valve was not well visualized. The leaflets are moderately thickened and exhibit at least mildly reduced excursion. The leaflets are severely calcified.   Pulmonic Valve  The pulmonic valve was not well visualized. Trace regurgitation. There is no evidence of pulmonic valve stenosis.   Ascending Aorta  The aortic root is normal in size.   Pericardium  There is no pericardial effusion.   Study Details  A complete echo was performed using complete 2D, color flow Doppler and spectral Doppler. During the study the apical and parasternal views were captured. Overall the study quality was adequate. The imaging is on file and stored in a permanent location.    Assessment and Plan  1. PAF - no recent symtpoms - has been 6 months with admission with GI bleed, last Hgb was up to 15 - retry eliquis 5mg  bid, cbc 2 weeks after starting - still plan for watchman clinic evaluation, likely a good long term solution for him.      2.HTN - at goal, continue current meds      Antoine Poche, M.D.

## 2023-02-25 NOTE — Patient Instructions (Addendum)
Medication Instructions:  Your physician has recommended you make the following change in your medication:  Start eliquis 5 mg twice daily Start meclizine 25 mg every 8 hours as needed for dizziness Continue all other medications the same  Labwork: CBC in 1 week (03/04/2023) @[facility] ([address] [place]) Non-fasting  Testing/Procedures: none  Follow-Up: Your physician recommends that you schedule a follow-up appointment in: 4 months  Any Other Special Instructions Will Be Listed Below (If Applicable). You have been referred to Dr. Lalla Brothers  If you need a refill on your cardiac medications before your next appointment, please call your pharmacy.

## 2023-03-04 DIAGNOSIS — Z79899 Other long term (current) drug therapy: Secondary | ICD-10-CM | POA: Diagnosis not present

## 2023-03-04 DIAGNOSIS — I480 Paroxysmal atrial fibrillation: Secondary | ICD-10-CM | POA: Diagnosis not present

## 2023-03-05 LAB — CBC
Hematocrit: 42.8 % (ref 37.5–51.0)
Hemoglobin: 14.8 g/dL (ref 13.0–17.7)
MCH: 32.9 pg (ref 26.6–33.0)
MCHC: 34.6 g/dL (ref 31.5–35.7)
MCV: 95 fL (ref 79–97)
Platelets: 120 10*3/uL — ABNORMAL LOW (ref 150–450)
RBC: 4.5 x10E6/uL (ref 4.14–5.80)
RDW: 13.4 % (ref 11.6–15.4)
WBC: 8.4 10*3/uL (ref 3.4–10.8)

## 2023-03-19 DIAGNOSIS — N1832 Chronic kidney disease, stage 3b: Secondary | ICD-10-CM | POA: Diagnosis not present

## 2023-03-19 DIAGNOSIS — I129 Hypertensive chronic kidney disease with stage 1 through stage 4 chronic kidney disease, or unspecified chronic kidney disease: Secondary | ICD-10-CM | POA: Diagnosis not present

## 2023-03-19 DIAGNOSIS — R809 Proteinuria, unspecified: Secondary | ICD-10-CM | POA: Diagnosis not present

## 2023-03-19 DIAGNOSIS — D631 Anemia in chronic kidney disease: Secondary | ICD-10-CM | POA: Diagnosis not present

## 2023-03-29 ENCOUNTER — Telehealth: Payer: Self-pay | Admitting: *Deleted

## 2023-03-29 NOTE — Telephone Encounter (Signed)
Lesle Chris, LPN 09/26/1094  0:45 PM EDT Back to Top    Notified, copy to pcp.

## 2023-03-29 NOTE — Telephone Encounter (Signed)
-----   Message from Antoine Poche, MD sent at 03/29/2023 10:59 AM EDT ----- Normal blood counts, no signs of recurrent anemia  Dominga Ferry MD

## 2023-04-07 ENCOUNTER — Other Ambulatory Visit: Payer: Self-pay

## 2023-04-07 NOTE — Progress Notes (Signed)
Electrophysiology Office Note:    Date:  04/08/2023   ID:  Nicolas Berry, DOB December 30, 1945, MRN [id number]  [facility] Cardiologist:  Dina Rich, MD  [facility] Electrophysiologist:  Lanier Prude, MD   Referring MD: Doreatha Massed, MD   Chief Complaint: Atrial fibrillation  History of Present Illness:    Nicolas Berry is a 77 y.o. malewho I am seeing today for an evaluation of atrial fibrillation at the request of Dr. Wyline Mood.  The patient was last seen by Dr. Wyline Mood on February 25, 2023.  The patient has a medical history that includes atrial fibrillation, GI bleeding, coronary artery disease, COPD, hypertension, hyperlipidemia.  The patient was recently admitted to [facility] in December 2023 for acute blood loss anemia and melena.  He had another GI bleeding episode in January 2022.  Today he is recovering nicely from his hospitalization.  He is feeling better.  He has a capsule endoscopy scheduled soon he is back on his Eliquis twice daily without recurrent bleeding issues that he is aware of.      Their past medical, social and family history was reveiwed.   ROS:   Please see the history of present illness.    All other systems reviewed and are negative.  EKGs/Labs/Other Studies Reviewed:    The following studies were reviewed today:  November 2021 echo EF 60% RV function normal   February 25, 2023 EKG shows sinus rhythm June 05, 2022 EKG shows sinus rhythm May 17, 2016 EKG shows atrial fibrillation with rapid ventricular rates      Physical Exam:    VS:  BP (!) 160/80   Pulse (!) 55   Ht 5\' 10"  (1.778 m)   Wt 160 lb 12.8 oz (72.9 kg)   SpO2 97%   BMI 23.07 kg/m     Wt Readings from Last 3 Encounters:  04/08/23 160 lb 12.8 oz (72.9 kg)  02/25/23 151 lb (68.5 kg)  11/13/22 148 lb 9.6 oz (67.4 kg)     GEN:  Well nourished, well developed in no acute distress CARDIAC: RRR, no murmurs, rubs, gallops RESPIRATORY:  Clear to  auscultation without rales, wheezing or rhonchi       ASSESSMENT AND PLAN:    1. PAF (paroxysmal atrial fibrillation) (HCC)   2. Essential hypertension   3. H/O: GI bleed     #Paroxysmal atrial fibrillation #GI bleeding On Eliquis for stroke prophylaxis but desires a stroke risk mitigation strategy that avoids long-term exposure anticoagulation.  I think this is very reasonable. He needs an --------------------------  I have seen Nicolas Berry in the office today who is being considered for a Watchman left atrial appendage closure device. I believe they will benefit from this procedure given their history of atrial fibrillation, CHA2DS2-VASc score of 5 and unadjusted ischemic stroke rate of 7.2% per year. Unfortunately, the patient is not felt to be a long term anticoagulation candidate secondary to GI bleeding. The patient's chart has been reviewed and I feel that they would be a candidate for short term oral anticoagulation after Watchman implant.   It is my belief that after undergoing a LAA closure procedure, [name] will not need long term anticoagulation which eliminates anticoagulation side effects and major bleeding risk.   Procedural risks for the Watchman implant have been reviewed with the patient including a 0.5% risk of stroke, <1% risk of perforation and <1% risk of device embolization. Other risks include bleeding, vascular damage, tamponade, worsening renal function, and death. The  patient understands these risk and wishes to proceed.     The published clinical data on the safety and effectiveness of WATCHMAN include but are not limited to the following: - Holmes DR, Everlene Farrier, Sick P et al. for the PROTECT AF Investigators. Percutaneous closure of the left atrial appendage versus warfarin therapy for prevention of stroke in patients with atrial fibrillation: a randomised non-inferiority trial. Lancet 2009; 374: 534-42. Everlene Farrier, Doshi SK, Isa Rankin D  et al. on behalf of the PROTECT AF Investigators. Percutaneous Left Atrial Appendage Closure for Stroke Prophylaxis in Patients With Atrial Fibrillation 2.3-Year Follow-up of the PROTECT AF (Watchman Left Atrial Appendage System for Embolic Protection in Patients With Atrial Fibrillation) Trial. Circulation 2013; 127:720-729. - Alli O, Doshi S,  Kar S, Reddy VY, Sievert H et al. Quality of Life Assessment in the Randomized PROTECT AF (Percutaneous Closure of the Left Atrial Appendage Versus Warfarin Therapy for Prevention of Stroke in Patients With Atrial Fibrillation) Trial of Patients at Risk for Stroke With Nonvalvular Atrial Fibrillation. J Am Coll Cardiol 2013; 61:1790-8. Aline August DR, Mia [place], Price M, Whisenant B, Sievert H, Doshi S, Huber K, Reddy V. Prospective randomized evaluation of the Watchman left atrial appendage Device in patients with atrial fibrillation versus long-term warfarin therapy; the PREVAIL trial. Journal of the [employer] of Cardiology, Vol. 4, No. 1, 2014, 1-11. - Kar S, Doshi SK, Sadhu A, Horton R, Osorio J et al. Primary outcome evaluation of a next-generation left atrial appendage closure device: results from the PINNACLE FLX trial. Circulation 2021;143(18)1754-1762.    After today's visit with the patient which was dedicated solely for shared decision making visit regarding LAA closure device, the patient decided to proceed with the LAA appendage closure procedure scheduled to be done in the near future at [facility]. Prior to the procedure, I would like to obtain a gated CT scan of the chest with contrast timed for PV/LA visualization.   Additionally, the patient will need an updated echocardiogram.  HAS-BLED score 3 Hypertension Yes  Abnormal renal and liver function (Dialysis, transplant, Cr >2.26 mg/dL /Cirrhosis or Bilirubin >2x Normal or AST/ALT/AP >3x Normal) No  Stroke No  Bleeding Yes  Labile INR (Unstable/high INR) No  Elderly (>65) Yes  Drugs  or alcohol (? 8 drinks/week, anti-plt or NSAID) No   CHA2DS2-VASc Score = 5  The patient's score is based upon: CHF History: 0 HTN History: 1 Diabetes History: 1 Stroke History: 0 Vascular Disease History: 1 Age Score: 2 Gender Score: 0   #Hypertension Above goal today.  Recommend checking blood pressures 1-2 times per week at home and recording the values.  Recommend bringing these recordings to the primary care physician. Continue Coreg, losartan.       Signed, Rossie Muskrat. Lalla Brothers, MD, [facility], [facility] 04/08/2023 2:36 PM    Electrophysiology [facility]

## 2023-04-08 ENCOUNTER — Encounter: Payer: Self-pay | Admitting: Cardiology

## 2023-04-08 ENCOUNTER — Ambulatory Visit: Payer: 59 | Attending: Cardiology | Admitting: Cardiology

## 2023-04-08 ENCOUNTER — Encounter: Payer: Self-pay | Admitting: Hematology

## 2023-04-08 VITALS — BP 160/80 | HR 55 | Ht 70.0 in | Wt 160.8 lb

## 2023-04-08 DIAGNOSIS — I10 Essential (primary) hypertension: Secondary | ICD-10-CM

## 2023-04-08 DIAGNOSIS — I480 Paroxysmal atrial fibrillation: Secondary | ICD-10-CM

## 2023-04-08 DIAGNOSIS — Z8719 Personal history of other diseases of the digestive system: Secondary | ICD-10-CM | POA: Diagnosis not present

## 2023-04-08 NOTE — Patient Instructions (Addendum)
Medication Instructions:  Your physician recommends that you continue on your current medications as directed. Please refer to the Current Medication list given to you today.  *If you need a refill on your cardiac medications before your next appointment, please call your pharmacy*  Lab Work: BMET  Testing/Procedures: Your physician has requested that you have an echocardiogram. Echocardiography is a painless test that uses sound waves to create images of your heart. It provides your doctor with information about the size and shape of your heart and how well your heart's chambers and valves are working. This procedure takes approximately one hour. There are no restrictions for this procedure. Please do NOT wear cologne, perfume, aftershave, or lotions (deodorant is allowed). Please arrive 15 minutes prior to your appointment time.  Your physician has requested that you have cardiac CT. Cardiac computed tomography (CT) is a painless test that uses an x-ray machine to take clear, detailed pictures of your heart. For further information please visit [email or web address] Please follow instruction sheet as given.  Follow-Up: At [facility], you and your health needs are our priority.  As part of our continuing mission to provide you with exceptional heart care, we have created designated Provider Care Teams.  These Care Teams include your primary Cardiologist (physician) and Advanced Practice Providers (APPs -  Physician Assistants and Nurse Practitioners) who all work together to provide you with the care you need, when you need it.  Your next appointment:   After testing is complete you will be contacted by Nurse Navigator, Karsten Fells to schedule your pre-procedure visit and procedure date. If you have any questions she can be reached at [phone].

## 2023-04-09 LAB — BASIC METABOLIC PANEL
BUN/Creatinine Ratio: 11 (ref 10–24)
BUN: 17 mg/dL (ref 8–27)
CO2: 26 mmol/L (ref 20–29)
Calcium: 9.7 mg/dL (ref 8.6–10.2)
Chloride: 103 mmol/L (ref 96–106)
Creatinine, Ser: 1.58 mg/dL — ABNORMAL HIGH (ref 0.76–1.27)
Glucose: 108 mg/dL — ABNORMAL HIGH (ref 70–99)
Potassium: 4.3 mmol/L (ref 3.5–5.2)
Sodium: 142 mmol/L (ref 134–144)
eGFR: 45 mL/min/{1.73_m2} — ABNORMAL LOW (ref >59)

## 2023-04-14 ENCOUNTER — Telehealth: Payer: Self-pay

## 2023-04-14 ENCOUNTER — Other Ambulatory Visit: Payer: Self-pay

## 2023-04-14 DIAGNOSIS — Z8719 Personal history of other diseases of the digestive system: Secondary | ICD-10-CM

## 2023-04-14 DIAGNOSIS — I480 Paroxysmal atrial fibrillation: Secondary | ICD-10-CM

## 2023-04-14 NOTE — Telephone Encounter (Signed)
-----   Message from Rossie Muskrat [facility] sent at 04/12/2023  8:35 AM EDT ----- Kidney function abnormal. I would like to use a TEE on the table rather than a CT scan to avoid exposure to excessive contrast. Orpha Bur, can you make the change please? Thanks!  Sheria Lang T. Lalla Brothers, MD, [facility], [facility] Cardiac Electrophysiology

## 2023-04-14 NOTE — Telephone Encounter (Signed)
Reviewed results with patient who verbalized understanding.   The patient understands CT will be cancelled and he will have TEE DOS. He wishes to proceed with LAAO 07/15/2023. He is scheduled with Dr. Wyline Mood 06/30/2023. He understands he will have an EKG, blood work, and his instruction letter will be ready at that visit. He was grateful for call and agreed with plan.

## 2023-04-15 ENCOUNTER — Other Ambulatory Visit: Payer: 59

## 2023-04-20 ENCOUNTER — Telehealth: Payer: Self-pay | Admitting: Cardiology

## 2023-04-20 NOTE — Telephone Encounter (Signed)
Amil Amen with [facility] is calling requesting the 07/12 appt notes so a authorized referral can be sent for the Echo that was ordered and is scheduled for tomorrow, 07/31. The fax number is [phone]. Please advise.

## 2023-04-20 NOTE — Telephone Encounter (Signed)
Left message for Julia to call back.

## 2023-04-21 ENCOUNTER — Ambulatory Visit: Payer: 59 | Attending: Cardiology

## 2023-04-21 DIAGNOSIS — I480 Paroxysmal atrial fibrillation: Secondary | ICD-10-CM | POA: Diagnosis not present

## 2023-04-21 DIAGNOSIS — I10 Essential (primary) hypertension: Secondary | ICD-10-CM

## 2023-04-21 DIAGNOSIS — Z8719 Personal history of other diseases of the digestive system: Secondary | ICD-10-CM

## 2023-04-26 DIAGNOSIS — C44629 Squamous cell carcinoma of skin of left upper limb, including shoulder: Secondary | ICD-10-CM | POA: Diagnosis not present

## 2023-04-30 ENCOUNTER — Ambulatory Visit: Payer: No Typology Code available for payment source | Admitting: Cardiology

## 2023-05-02 IMAGING — US US RENAL
1 series · 14 of 25 positions shown · non-contrast
Comparison: CT abdomen pelvis 12/20/2019

CLINICAL DATA: Chronic kidney disease

EXAM:
RENAL / URINARY TRACT ULTRASOUND COMPLETE

[Series 1: us renal · 0.26mm/px · 14 of 70 slices shown]
[im 1/70]
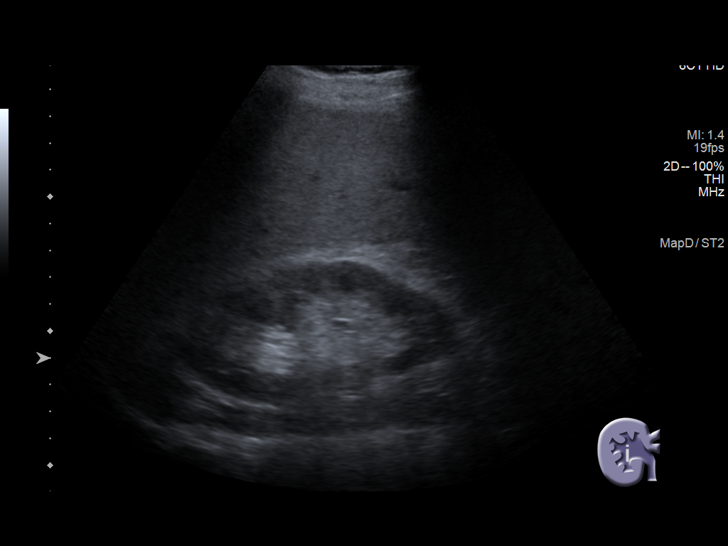
[im 6/70]
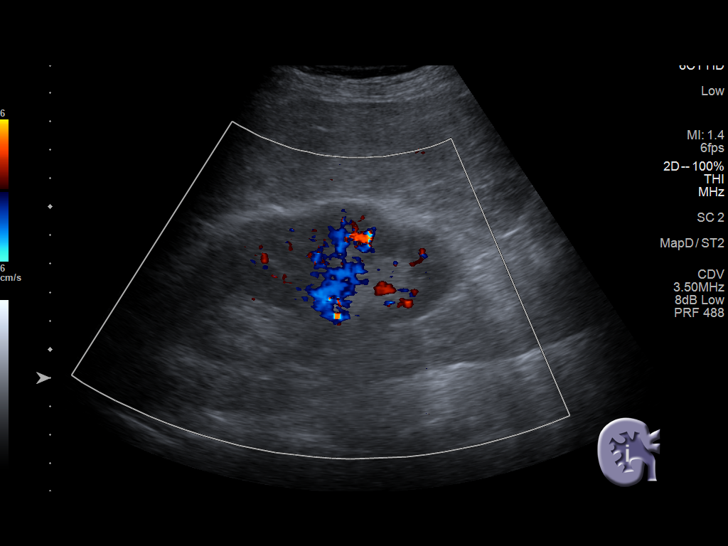
[im 12/70]
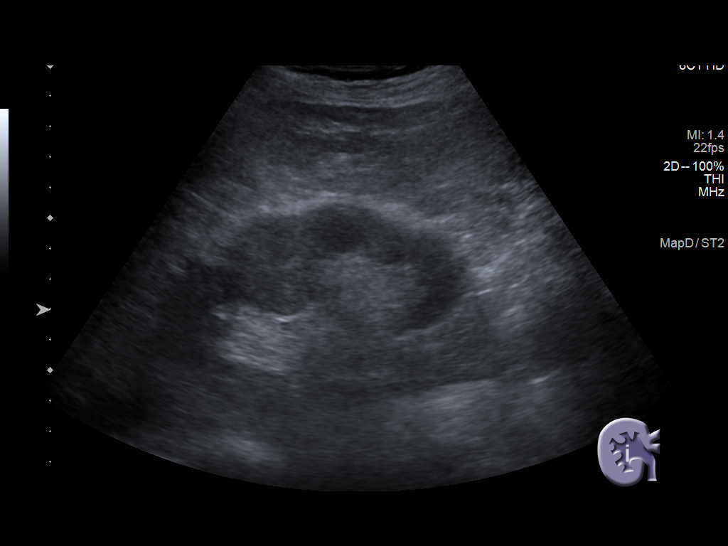
[im 18/70]
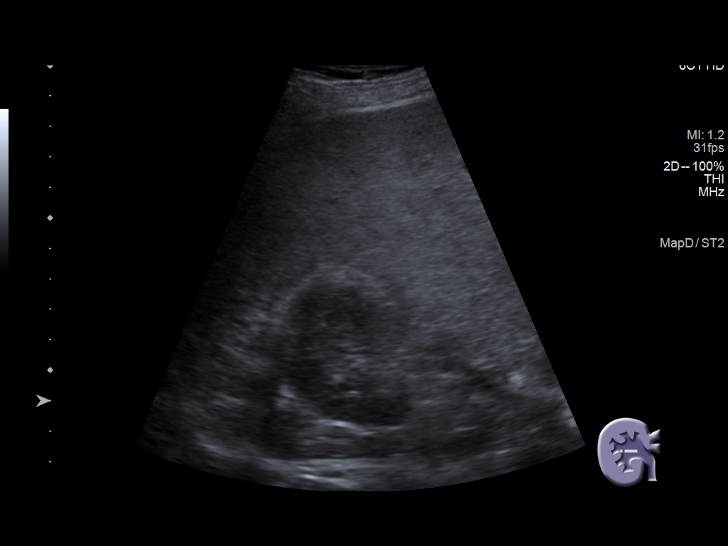
[im 24/70]
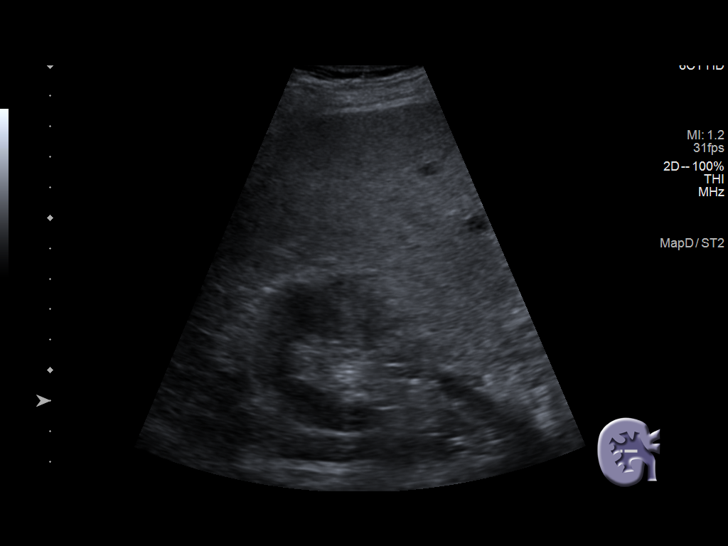
[im 26/70]
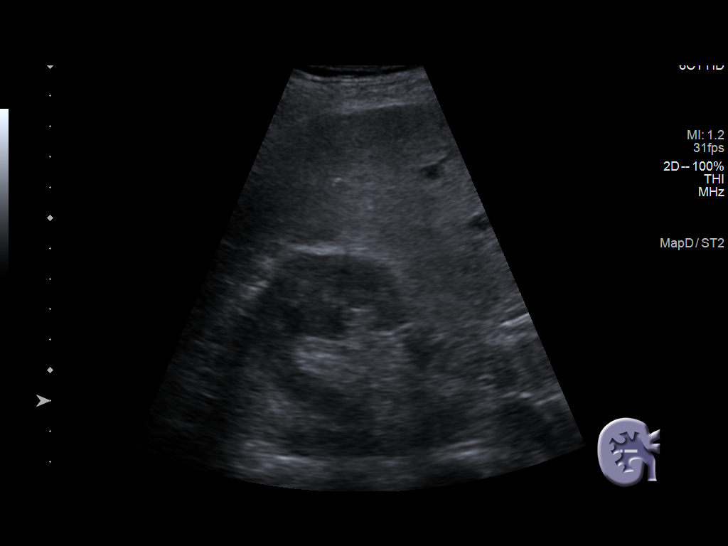
[im 32/70]
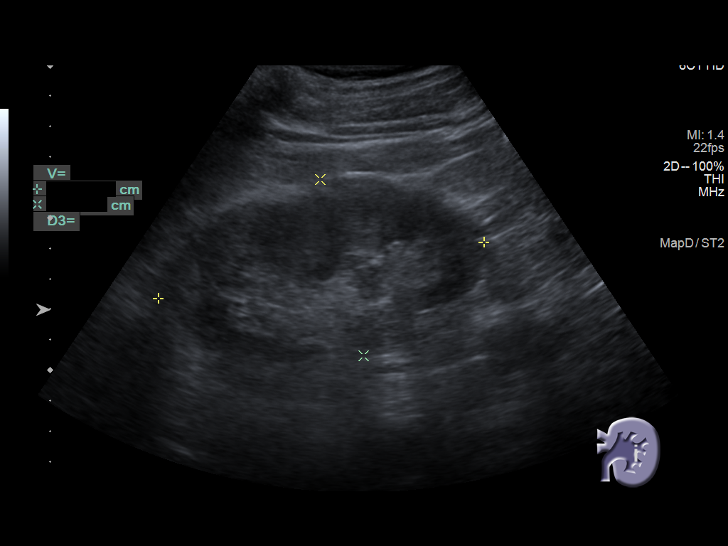
[im 38/70]
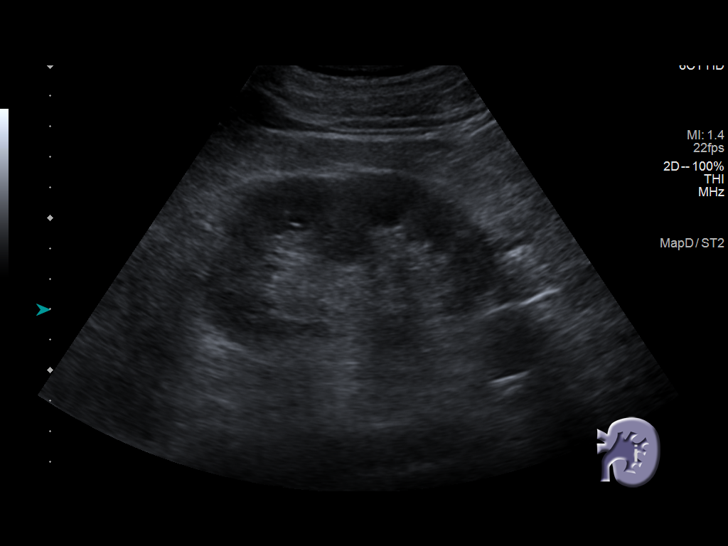
[im 44/70]
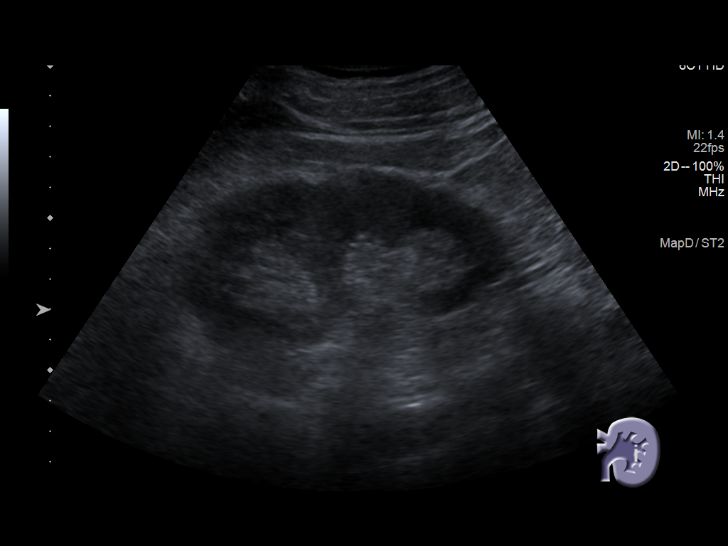
[im 47/70]
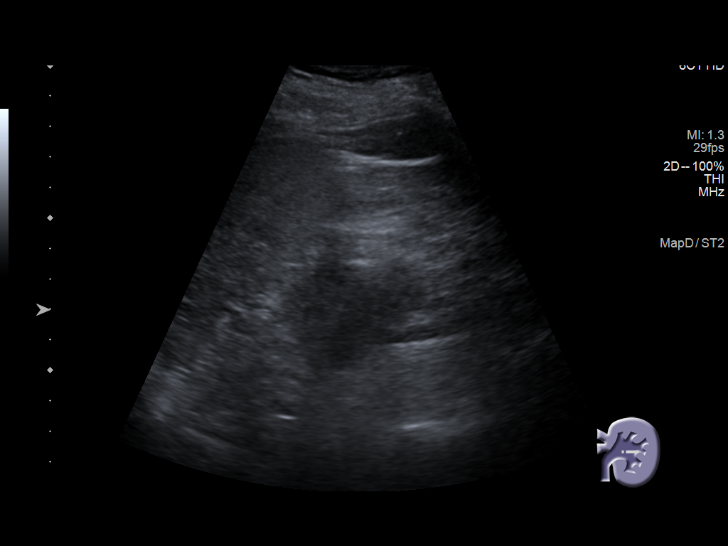
[im 52/70]
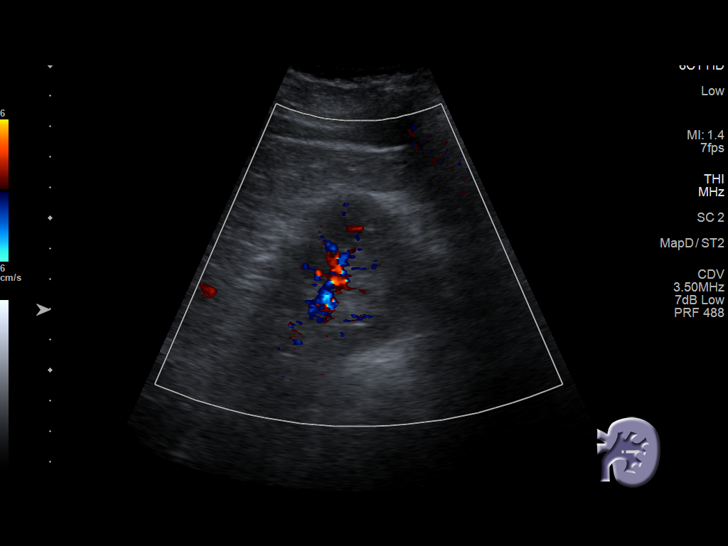
[im 58/70]
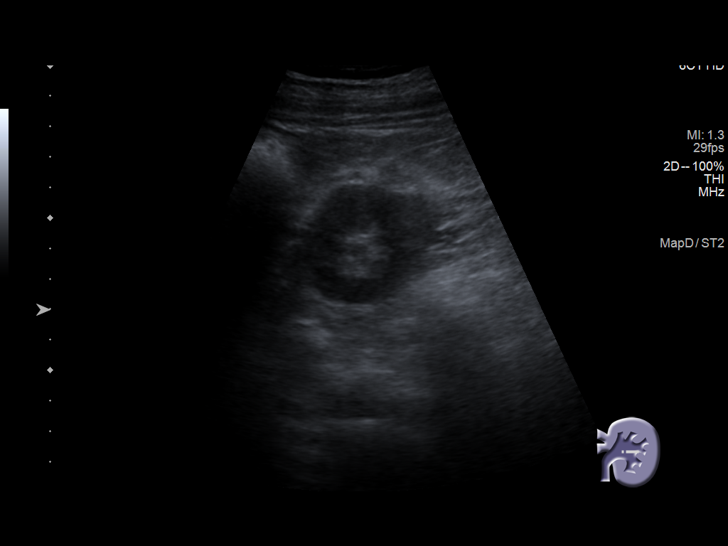
[im 64/70]
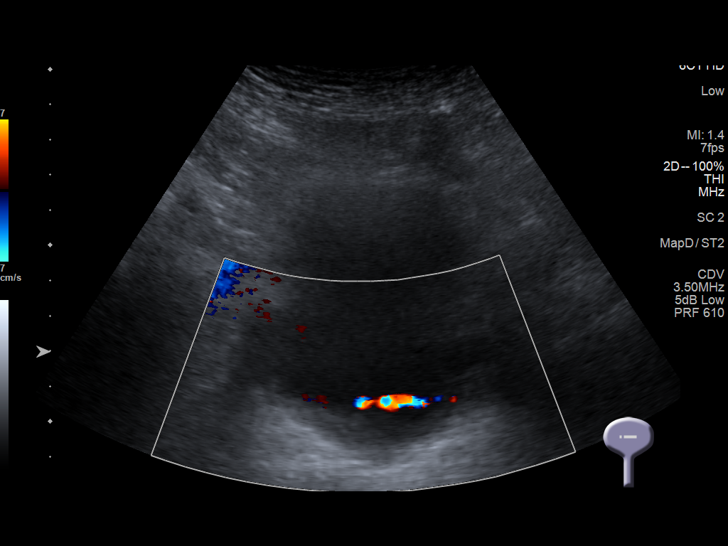
[im 70/70]
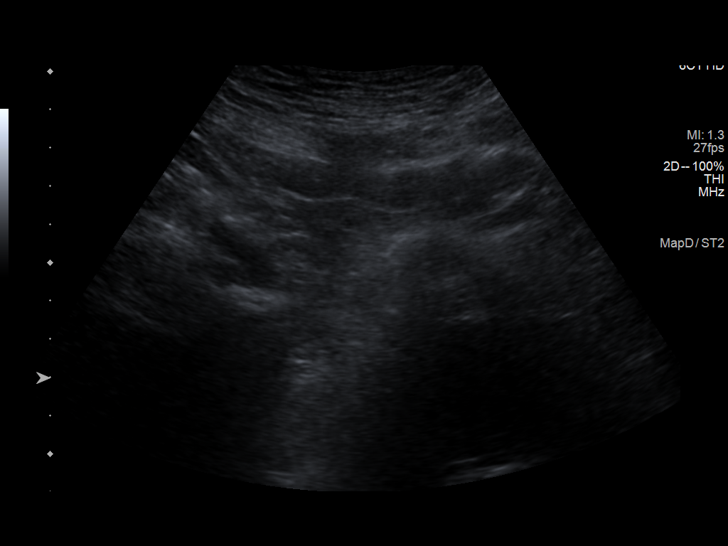

[14 of 25 positions shown; findings below may reference images not displayed]

FINDINGS: Right Kidney:

Renal measurements: 11.2 x 4.9 x 6.1 cm = volume: 175 mL.
Echogenicity within normal limits. No mass or hydronephrosis
visualized.

Left Kidney:

Renal measurements: 10.8 x 6.0 x 5.7 cm = volume: 194 mL.
Echogenicity within normal limits. No mass or hydronephrosis
visualized.

Bladder:

Appears normal for degree of bladder distention.

Other:

Diffuse increased echogenicity of the visualized portions of the
hepatic parenchyma are a nonspecific indicator of hepatocellular
dysfunction, most commonly steatosis.
IMPRESSION: No significant sonographic abnormality of the kidneys.

## 2023-05-03 ENCOUNTER — Other Ambulatory Visit (HOSPITAL_COMMUNITY): Payer: 59

## 2023-05-03 ENCOUNTER — Encounter (HOSPITAL_BASED_OUTPATIENT_CLINIC_OR_DEPARTMENT_OTHER): Payer: Self-pay | Admitting: Cardiology

## 2023-05-11 ENCOUNTER — Encounter: Payer: Self-pay | Admitting: Hematology

## 2023-05-19 ENCOUNTER — Ambulatory Visit: Payer: 59 | Admitting: Urology

## 2023-06-07 DIAGNOSIS — Z85828 Personal history of other malignant neoplasm of skin: Secondary | ICD-10-CM | POA: Diagnosis not present

## 2023-06-07 DIAGNOSIS — L821 Other seborrheic keratosis: Secondary | ICD-10-CM | POA: Diagnosis not present

## 2023-06-07 DIAGNOSIS — Z08 Encounter for follow-up examination after completed treatment for malignant neoplasm: Secondary | ICD-10-CM | POA: Diagnosis not present

## 2023-06-30 ENCOUNTER — Telehealth: Payer: Self-pay

## 2023-06-30 ENCOUNTER — Encounter: Payer: Self-pay | Admitting: Cardiology

## 2023-06-30 ENCOUNTER — Ambulatory Visit: Payer: 59 | Attending: Cardiology | Admitting: Cardiology

## 2023-06-30 VITALS — BP 120/62 | HR 64 | Ht 70.0 in | Wt 160.2 lb

## 2023-06-30 DIAGNOSIS — I10 Essential (primary) hypertension: Secondary | ICD-10-CM

## 2023-06-30 DIAGNOSIS — I480 Paroxysmal atrial fibrillation: Secondary | ICD-10-CM

## 2023-06-30 DIAGNOSIS — R809 Proteinuria, unspecified: Secondary | ICD-10-CM | POA: Diagnosis not present

## 2023-06-30 DIAGNOSIS — D649 Anemia, unspecified: Secondary | ICD-10-CM | POA: Diagnosis not present

## 2023-06-30 DIAGNOSIS — I2510 Atherosclerotic heart disease of native coronary artery without angina pectoris: Secondary | ICD-10-CM | POA: Diagnosis not present

## 2023-06-30 DIAGNOSIS — D696 Thrombocytopenia, unspecified: Secondary | ICD-10-CM | POA: Diagnosis not present

## 2023-06-30 DIAGNOSIS — I5032 Chronic diastolic (congestive) heart failure: Secondary | ICD-10-CM | POA: Diagnosis not present

## 2023-06-30 DIAGNOSIS — N1831 Chronic kidney disease, stage 3a: Secondary | ICD-10-CM | POA: Diagnosis not present

## 2023-06-30 DIAGNOSIS — R42 Dizziness and giddiness: Secondary | ICD-10-CM

## 2023-06-30 DIAGNOSIS — E1129 Type 2 diabetes mellitus with other diabetic kidney complication: Secondary | ICD-10-CM | POA: Diagnosis not present

## 2023-06-30 NOTE — Patient Instructions (Signed)
Medication Instructions:   Continue all current medications.   Labwork:  Will obtain results when available   Testing/Procedures:  none  Follow-Up:  3 months   Any Other Special Instructions Will Be Listed Below (If Applicable).   If you need a refill on your cardiac medications before your next appointment, please call your pharmacy.

## 2023-06-30 NOTE — Progress Notes (Addendum)
Clinical Summary [name] is a 77 y.o.male seen today for follow up of the following medical problems.    1. Afib/PAF - previously seen in afib clinic - rate control with coreg, eliquis for stroke prevention.     - prior issues with anemia and heme + stools, has been off eliquis -  GI work up with EGD with gastritis, and colonscopy polyps no source of bleeding     - taking eliquis, has noted some dark stools at times, no bright red blood - dark stools last 2 weeks.  - no recent palpitations.    2. History of GI bleed - recently admitted [facility] 09/10/2022 thru 09/12/2022 for acute blood loss anemia and melena  - He had a GIB episode last year 09/2020 but bidirectional endoscopy work up was unrevealing for active bleeding source. He was noted to have chronic active gastritis positive for intestinal metaplasia. Repeat EGD at [facility] 12/22 revealed wide open Schatzki's ring, mild non erosive antral gastropathy (negative for HP), normal visualized duodenum, but no blood or bleeding lesions seen throughout. Follow up colonoscopy 12/23 revealed few non-advanced adenomas, old melena in the colon but no active bleeding. Terminal ileum was not able to be intubated despite multiple attempts. He was suspected of having a small bowel bleed vs. right sided diverticular bleed.       2. CAD - incidental finding on recent CT PE - 04/2016 nuclear stress test no ischemia   -no specific chest pains.      3. Esophageal thickening - incidental finding on CT scan - followed by pcp and GI       4. COPD - emphsymatous changes noted on recent CT PE - followed by pcp     5. Leg pains - occurs at rest. Occurs in thighs and calves.  - can be worst with walking.   01/2018 ABI right 0.98, left 0.92     6. HTN - he is compliant with meds     7. Hyperlipidemia - labs followed by [place] - he is on statin   8.CKD - followed by Dr Nicolas Berry        9. Valvular heart disease - 07/2020 echo  LVEF 60-65%, no WMAs, mild MR, very mild AS mean grad 9, AVA VTI 2.64 08/2022 LVEF 60-65%, mild MR, thickened AV but no stenosis    10. Carotid bruits - Jan 2023 no significant stenosis   11. Dizziness - feeling of beeing off balance - feels off balance, occasional lightheadedness   - [facility]  - coreg lowered to 3.144m in AM and 6.25mg  in PM by other provider.    - seen by neurologist. Weaning neurontin. Has upcoming MRI. He is not sure if he had the MRI - drinks water regularly maintaing hydration.  Past Medical History:  Diagnosis Date   Anemia    Anxiety    OA multiple joints   Arthritis    Atrial fibrillation (HCC)    BPH (benign prostatic hyperplasia)    COPD (chronic obstructive pulmonary disease) (HCC)    uses inhaler   Coronary artery disease    DDD (degenerative disc disease), lumbar    Diabetes mellitus without complication (HCC)    Dyspnea    Fatty liver    GERD (gastroesophageal reflux disease)    Headache    Hyperlipidemia    Hypertension      Allergies  Allergen Reactions   Lorazepam Anxiety     Current  Outpatient Medications  Medication Sig Dispense Refill   acarbose (PRECOSE) 100 MG tablet Take by mouth.     albuterol (VENTOLIN HFA) 108 (90 Base) MCG/ACT inhaler Inhale 2 puffs into the lungs every 4 (four) hours as needed for wheezing or shortness of breath.     apixaban (ELIQUIS) 5 MG TABS tablet Take 1 tablet (5 mg total) by mouth 2 (two) times daily. 60 tablet 6   carvedilol (COREG) 3.125 MG tablet TAKE 1 TABLET BY MOUTH TWICE DAILY STOPPING  CARDIZEM 180 tablet 3   Cholecalciferol 25 MCG (1000 UT) tablet Take 1,000 Units by mouth daily.     DULoxetine (CYMBALTA) 60 MG capsule Take 60 mg by mouth every evening.     famotidine (PEPCID) 20 MG tablet Take 20 mg by mouth daily.     ferrous sulfate 325 (65 FE) MG tablet Take 325 mg by mouth daily with breakfast.     finasteride (PROSCAR) 5 MG tablet Take 1 tablet (5 mg  total) by mouth daily. 90 tablet 3   fluticasone (FLONASE) 50 MCG/ACT nasal spray Place 1 spray into both nostrils daily as needed for allergies or rhinitis.     Fluticasone-Salmeterol (ADVAIR) 250-50 MCG/DOSE AEPB Inhale 1 puff into the lungs 2 (two) times daily as needed (shortness of breath).     gabapentin (NEURONTIN) 300 MG capsule Take 300 mg by mouth 3 (three) times daily.  1   losartan (COZAAR) 25 MG tablet Take 25 mg by mouth daily.     meclizine (ANTIVERT) 25 MG tablet Take 1 tablet (25 mg total) by mouth 3 (three) times daily as needed for dizziness. 30 tablet 0   Menthol, Topical Analgesic, (BIOFREEZE EX) Apply 1 Application topically at bedtime.     metFORMIN (GLUCOPHAGE) 500 MG tablet Take 500 mg by mouth 2 (two) times daily with a meal.     mirabegron ER (MYRBETRIQ) 25 MG TB24 tablet Take 1 tablet (25 mg total) by mouth daily. 30 tablet 0   pantoprazole (PROTONIX) 20 MG tablet Take 40 mg by mouth daily.     rosuvastatin (CRESTOR) 40 MG tablet Take 40 mg by mouth at bedtime.     tamsulosin (FLOMAX) 0.4 MG CAPS capsule Take by mouth.     traZODone (DESYREL) 100 MG tablet Take 100 mg by mouth at bedtime.     No current facility-administered medications for this visit.     Past Surgical History:  Procedure Laterality Date   BACK SURGERY     C6-C7   BALLOON DILATION N/A 10/01/2020   Procedure: BALLOON DILATION;  Surgeon: Lanelle Bal, DO;  [facility];  Service: Endoscopy;  Laterality: N/A;   BIOPSY  10/01/2020   Procedure: BIOPSY;  Surgeon: Lanelle Bal, DO;  [facility];  Service: Endoscopy;;  gastric   COLONOSCOPY WITH PROPOFOL N/A 10/01/2020   Procedure: COLONOSCOPY WITH PROPOFOL;  Surgeon: Lanelle Bal, DO;  [facility];  Service: Endoscopy;  Laterality: N/A;  2:15pm, pt know new time per office   ESOPHAGOGASTRODUODENOSCOPY (EGD) WITH PROPOFOL N/A 10/01/2020   Procedure: ESOPHAGOGASTRODUODENOSCOPY (EGD) WITH PROPOFOL;   Surgeon: Lanelle Bal, DO;  [facility];  Service: Endoscopy;  Laterality: N/A;   HERNIA REPAIR     left inguinal   POLYPECTOMY  10/01/2020   Procedure: POLYPECTOMY INTESTINAL;  Surgeon: Lanelle Bal, DO;  [facility];  Service: Endoscopy;;   STRABISMUS SURGERY Left 10/25/2020   Procedure: STRABISMUS REPAIR LEFT  EYE;  Surgeon: Verne Carrow, MD;  Location: [facility];  Service: Ophthalmology;  Laterality: Left;   TONSILLECTOMY     TOTAL KNEE ARTHROPLASTY Right 06/12/2022   Procedure: RIGHT TOTAL KNEE ARTHROPLASTY;  Surgeon: Kathryne Hitch, MD;  Location: [facility];  Service: Orthopedics;  Laterality: Right;     Allergies  Allergen Reactions   Lorazepam Anxiety      Family History  Problem Relation Age of Onset   Cancer Mother        bladder   Diabetes Father    Cancer Sister        breast    Colon cancer Neg Hx      Social History [name] reports that he has been smoking cigarettes. He started smoking about 62 years ago. He has a 94.3 pack-year smoking history. He has never used smokeless tobacco. [name] reports current alcohol use.   Review of Systems CONSTITUTIONAL: No weight loss, fever, chills, weakness or fatigue.  HEENT: Eyes: No visual loss, blurred vision, double vision or yellow sclerae.No hearing loss, sneezing, congestion, runny nose or sore throat.  SKIN: No rash or itching.  CARDIOVASCULAR: per hpi RESPIRATORY: No shortness of breath, cough or sputum.  GASTROINTESTINAL: No anorexia, nausea, vomiting or diarrhea. No abdominal pain or blood.  GENITOURINARY: No burning on urination, no polyuria NEUROLOGICAL: No headache, dizziness, syncope, paralysis, ataxia, numbness or tingling in the extremities. No change in bowel or bladder control.  MUSCULOSKELETAL: No muscle, back pain, joint pain or stiffness.  LYMPHATICS: No enlarged nodes. No history of splenectomy.  PSYCHIATRIC: No history of depression  or anxiety.  ENDOCRINOLOGIC: No reports of sweating, cold or heat intolerance. No polyuria or polydipsia.  Marland Kitchen   Physical Examination Today's Vitals   06/30/23 1347  BP: 120/62  Pulse: 64  SpO2: 97%  Weight: 160 lb 3.2 oz (72.7 kg)  Height: 5\' 10"  (1.778 m)   Body mass index is 22.99 kg/m.  Gen: resting comfortably, no acute distress HEENT: no scleral icterus, pupils equal round and reactive, no palptable cervical adenopathy,  CV: RRR, 2/6 systolic murmur apex, no jvd Resp: Clear to auscultation bilaterally GI: abdomen is soft, non-tender, non-distended, normal bowel sounds, no hepatosplenomegaly MSK: extremities are warm, no edema.  Skin: warm, no rash Neuro:  no focal deficits Psych: appropriate affect  Risk calculations:   HAS-BLED score 3 Hypertension Yes  Abnormal renal and liver function (Dialysis, transplant, Cr >2.26 mg/dL /Cirrhosis or Bilirubin >2x Normal or AST/ALT/AP >3x Normal) No  Stroke No  Bleeding Yes  Labile INR (Unstable/high INR) No  Elderly (>65) Yes  Drugs or alcohol (>= 8 drinks/week, anti-plt or NSAID) No    CHA2DS2-VASc Score = 5  The patient's score is based upon: CHF History: 0 HTN History: 1 Diabetes History: 1 Stroke History: 0 Vascular Disease History: 1 Age Score: 2 Gender Score: 0  NYHA: Class I  Diagnostic Studies 04/2016 echo Study Conclusions   - Left ventricle: Turbulent flow through LVOT. Systolic anterior   motio of MV leaflets Peak gradient through LV/LVOT /AV is 15 mm   Hg at rest No signifiant obstruction at rest. The cavity size was   normal. Wall thickness was increased in a pattern of mild LVH.   Systolic function was vigorous. The estimated ejection fraction   was in the range of 65% to 70%. Doppler parameters are consistent   with abnormal left ventricular relaxation (grade 1 diastolic   dysfunction). - Aortic valve: AV is thickened,  calcified with minimally   restricted motion. - Mitral valve: SAM of mitral  valve leaflets. Calcified annulus.   Mildly thickened leaflets . - Left atrium: The atrium was mildly dilated. - Pulmonary arteries: PA peak pressure: 32 mm Hg (S).   04/2016 CT PE IMPRESSION: 1. No evidence of acute pulmonary embolism. 2. Mild cardiomegaly. Left ventricular hypertrophy. Mild aortic valvular calcification. 3. Moderate three-vessel coronary atherosclerosis and mild aortic atherosclerosis. 4. Mild circumferential wall thickening involving the upper thoracic esophagus and of the distal esophagus. Query esophagitis as no distinct mass is identified. Upper endoscopy may be confirmatory. 5. COPD/emphysema.  No acute cardiopulmonary disease.       04/2016 Nuclear stress There was no ST segment deviation noted during stress. No T wave inversion was noted during stress. The study is normal. This is a low risk study. The left ventricular ejection fraction is hyperdynamic (>65%).     01/2018 ABIs FINDINGS: Right ABI:  0.98   Left ABI:  0.92   Right Lower Extremity:  Normal arterial waveforms at the ankle.   Left Lower Extremity:  Normal arterial waveforms at the ankle.   IMPRESSION: 1. Borderline normal bilateral ABIs. Consider postexercise testing to exclude occult arterial occlusive disease.     07/2020 echo IMPRESSIONS     1. Left ventricular ejection fraction, by estimation, is 60 to 65%. The  left ventricle has normal function. The left ventricle has no regional  wall motion abnormalities. There is moderate left ventricular hypertrophy.  Left ventricular diastolic  parameters were normal.   2. Right ventricular systolic function is normal. The right ventricular  size is normal. There is normal pulmonary artery systolic pressure.   3. Left atrial size was mildly dilated.   4. Chordal SAM without LVOT gradient. The mitral valve is degenerative.  Mild mitral valve regurgitation. No evidence of mitral stenosis. Severe  mitral annular calcification.   5.  The aortic valve is tricuspid. There is moderate calcification of the  aortic valve. There is moderate thickening of the aortic valve. Aortic  valve regurgitation is trivial. Mild aortic valve stenosis.   6. The inferior vena cava is dilated in size with >50% respiratory  variability, suggesting right atrial pressure of 8 mmHg.    08/2022 [facility] echo Incomplete study. Patient refused to continue with scan half way through  due to pain.  Normal left ventricle systolic function, LVEF 60-65%  Aortic valve not very well-visualized but appears moderately thickened and  calcified.  Left Ventricle  Left ventricle size is normal. There is mild concentric hypertrophy. Systolic function is normal. EF: 60-65%. Wall motion cannot be accurately assessed. Unable to assess diastolic function due to incomplete study.   Right Ventricle  Right ventricle was not well visualized. Systolic function is normal.   Left Atrium  Left atrium size is normal.   Right Atrium  Right atrium was not well visualized.   Mitral Valve  The mitral valve was not well visualized. The leaflets are mildly thickened. There is mild posterior annular calcification. There is mild regurgitation.   Tricuspid Valve  The tricuspid valve was not well visualized. The leaflets exhibit probably normal excursion. There is trace regurgitation. Unable to assess RVSP due to incomplete Doppler signal.   Aortic Valve  The aortic valve was not well visualized. The leaflets are moderately thickened and exhibit at least mildly reduced excursion. The leaflets are severely calcified.   Pulmonic Valve  The pulmonic valve was not well visualized. Trace regurgitation. There is no  evidence of pulmonic valve stenosis.   Ascending Aorta  The aortic root is normal in size.   Pericardium  There is no pericardial effusion.   Study Details  A complete echo was performed using complete 2D, color flow Doppler and spectral Doppler. During the study  the apical and parasternal views were captured. Overall the study quality was adequate. The imaging is on file and stored in a permanent location.         Assessment and Plan   1. PAF/acquired thrombophilia - no symptoms - intermittent GI bleeding history, plans for watchman implant later this month - remains on eliquis at this time, intermittent dark stools. Just had labs with neprhology, f/u cbc     2.HTN - bp is at goal, continue current meds  3. CAD - no recent symptoms continue current meds  4. DIzziness -long history, negative orthostatics. Extensive workup by neuro, defer to his neurologist. No clear cardiac etiology.    Antoine Poche, M.D.

## 2023-07-02 ENCOUNTER — Telehealth: Payer: Self-pay

## 2023-07-02 ENCOUNTER — Telehealth: Payer: Self-pay | Admitting: Cardiology

## 2023-07-02 NOTE — Telephone Encounter (Signed)
See other phone note from today. The patient called back and spoke with Georgie Chard. Labs were requested.

## 2023-07-02 NOTE — Telephone Encounter (Signed)
Patient returned call regarding recent labs. Shared Dr. Lucio Edward office number [phone]). Called and left VM with their team to have labs faxed to our office for upcoming Watchman procedure. Waiting for return call or fax.   Georgie Chard NP-C Structural Heart Team  Phone: [phone]

## 2023-07-02 NOTE — Telephone Encounter (Signed)
The patient reported to Dr. Verna Czech nurse that he had labs drawn at [facility] so no labs were redrawn at his pre-procedure visit. No new results are in Epic.  Called patient to see who ordered his lab work to see who needs to be contacted for results.  If BMET/CMET and CBC were not obtained, will need to complete prior to LAAO date (07/15/2023).  Left message to call back.

## 2023-07-05 ENCOUNTER — Telehealth: Payer: Self-pay

## 2023-07-09 ENCOUNTER — Encounter: Payer: Self-pay | Admitting: Hematology

## 2023-07-12 ENCOUNTER — Telehealth: Payer: Self-pay

## 2023-07-12 ENCOUNTER — Encounter: Payer: Self-pay | Admitting: *Deleted

## 2023-07-12 NOTE — Telephone Encounter (Signed)
Confirmed procedure date of 07/15/2023. Confirmed arrival time of 0900 for procedure time at 1130. Reviewed pre-procedure and medication instructions with patient. The patient understands to call if questions/concerns arise prior to procedure.  He was grateful for call and agreed with plan.

## 2023-07-14 NOTE — Progress Notes (Addendum)
PCP - Doreatha Massed,  Cardiologist - Doreatha Massed, Electrophysiology - Cardiology Lanier Prude, MD   PPM/ICD -  Device Orders -  Rep Notified -   Chest x-ray -  EKG -109-24  Stress Test - 04-2016 ECHO - 04-21-23 Cardiac Cath -   CPAP -   DM - hx of   Blood Thinner Instructions: Eliquis  Follow instructions provided by surgeon  Aspirin Instructions:   ERAS Protcol - NPO  COVID TEST- no  Anesthesia review: no  Patient verbally denies any shortness of breath, fever, cough and chest pain during phone call   -------------  SDW INSTRUCTIONS given:  Your procedure is scheduled on July 15, 2023.  Report to [facility] Main Entrance "A" at 9:00 A.M., and check in at the Admitting office.  Call this number if you have problems the morning of surgery:  [phone]   Remember:  Do not eat  or drink after midnight the night before your surgery      Take these medicines the morning of surgery with A SIP OF WATER  apixaban (ELIQUIS)  famotidine (PEPCID)  finasteride (PROSCAR  gabapentin (NEURONTIN)   IF NEEDED albuterol (VENTOLIN HFA)  meclizine (ANTIVERT)     WHAT DO I DO ABOUT MY DIABETES MEDICATION?   Do not take oral diabetes medicines (pills) the morning of surgery.  metFORMIN (GLUCOPHAGE)   The day of surgery, do not take other diabetes injectables, including Byetta (exenatide), Bydureon (exenatide ER), Victoza (liraglutide), or Trulicity (dulaglutide).  If your CBG is greater than 220 mg/dL, you may take  of your sliding scale (correction) dose of insulin.   HOW TO MANAGE YOUR DIABETES BEFORE AND AFTER SURGERY  Why is it important to control my blood sugar before and after surgery? Improving blood sugar levels before and after surgery helps healing and can limit problems. A way of improving blood sugar control is eating a healthy diet by:  Eating less sugar and carbohydrates  Increasing activity/exercise  Talking with your  doctor about reaching your blood sugar goals High blood sugars (greater than 180 mg/dL) can raise your risk of infections and slow your recovery, so you will need to focus on controlling your diabetes during the weeks before surgery. Make sure that the doctor who takes care of your diabetes knows about your planned surgery including the date and location.  How do I manage my blood sugar before surgery? Check your blood sugar at least 4 times a day, starting 2 days before surgery, to make sure that the level is not too high or low.  Check your blood sugar the morning of your surgery when you wake up and every 2 hours until you get to the Short Stay unit.  If your blood sugar is less than 70 mg/dL, you will need to treat for low blood sugar: Do not take insulin. Treat a low blood sugar (less than 70 mg/dL) with  cup of clear juice (cranberry or apple), 4 glucose tablets, OR glucose gel. Recheck blood sugar in 15 minutes after treatment (to make sure it is greater than 70 mg/dL). If your blood sugar is not greater than 70 mg/dL on recheck, call [phone] for further instructions. Report your blood sugar to the short stay nurse when you get to Short Stay.  If you are admitted to the hospital after surgery: Your blood sugar will be checked by the staff and you will probably be given insulin after surgery (instead of oral diabetes medicines) to make sure you  have good blood sugar levels. The goal for blood sugar control after surgery is 80-180 mg/dL.  As of today, STOP taking any Aspirin (unless otherwise instructed by your surgeon) Aleve, Naproxen, Ibuprofen, Motrin, Advil, Goody's, BC's, all herbal medications, fish oil, and all vitamins.                      Do not wear jewelry, make up, or nail polish            Do not wear lotions, powders, perfumes/colognes, or deodorant.            Do not shave 48 hours prior to surgery.  Men may shave face and neck.            Do not bring valuables to  the hospital.            [facility] is not responsible for any belongings or valuables.  Do NOT Smoke (Tobacco/Vaping) 24 hours prior to your procedure If you use a CPAP at night, you may bring all equipment for your overnight stay.   Contacts, glasses, dentures or bridgework may not be worn into surgery.      For patients admitted to the hospital, discharge time will be determined by your treatment team.   Patients discharged the day of surgery will not be allowed to drive home, and someone needs to stay with them for 24 hours.    Special instructions:   [facility]- Preparing For Surgery  Before surgery, you can play an important role. Because skin is not sterile, your skin needs to be as free of germs as possible. You can reduce the number of germs on your skin by washing with CHG (chlorahexidine gluconate) Soap before surgery.  CHG is an antiseptic cleaner which kills germs and bonds with the skin to continue killing germs even after washing.    Oral Hygiene is also important to reduce your risk of infection.  Remember - BRUSH YOUR TEETH THE MORNING OF SURGERY WITH YOUR REGULAR TOOTHPASTE  Please do not use if you have an allergy to CHG or antibacterial soaps. If your skin becomes reddened/irritated stop using the CHG.  Do not shave (including legs and underarms) for at least 48 hours prior to first CHG shower. It is OK to shave your face.  Please follow these instructions carefully.   Shower the NIGHT BEFORE SURGERY and the MORNING OF SURGERY with DIAL Soap.   Pat yourself dry with a CLEAN TOWEL.  Wear CLEAN PAJAMAS to bed the night before surgery  Place CLEAN SHEETS on your bed the night of your first shower and DO NOT SLEEP WITH PETS.   Day of Surgery: Please shower morning of surgery  Wear Clean/Comfortable clothing the morning of surgery Do not apply any deodorants/lotions.   Remember to brush your teeth WITH YOUR REGULAR TOOTHPASTE.   Questions were answered.  Patient verbalized understanding of instructions.

## 2023-07-15 ENCOUNTER — Other Ambulatory Visit: Payer: Self-pay

## 2023-07-15 ENCOUNTER — Encounter (HOSPITAL_COMMUNITY)
Admission: RE | Disposition: A | Discharge status: Home / Self Care | Payer: Self-pay | Source: Home / Self Care | Attending: Cardiology

## 2023-07-15 ENCOUNTER — Inpatient Hospital Stay (HOSPITAL_COMMUNITY)
Admission: RE | Admit: 2023-07-15 | Discharge: 2023-07-16 | DRG: 274 | Disposition: A | Discharge status: Home / Self Care | Payer: No Typology Code available for payment source | Attending: Cardiology | Admitting: Cardiology

## 2023-07-15 ENCOUNTER — Inpatient Hospital Stay (HOSPITAL_COMMUNITY)
Admission: RE | Admit: 2023-07-15 | Discharge: 2023-07-15 | Disposition: A | Discharge status: Home / Self Care | Payer: No Typology Code available for payment source | Source: Ambulatory Visit | Attending: Cardiology | Admitting: Cardiology

## 2023-07-15 ENCOUNTER — Ambulatory Visit (HOSPITAL_COMMUNITY)
Admission: RE | Admit: 2023-07-15 | Discharge: 2023-07-15 | Disposition: A | Discharge status: Home / Self Care | Payer: No Typology Code available for payment source | Source: Ambulatory Visit | Attending: Cardiology | Admitting: Cardiology

## 2023-07-15 ENCOUNTER — Inpatient Hospital Stay (HOSPITAL_COMMUNITY): Payer: No Typology Code available for payment source | Admitting: Anesthesiology

## 2023-07-15 ENCOUNTER — Encounter (HOSPITAL_COMMUNITY): Payer: Self-pay | Admitting: Cardiology

## 2023-07-15 DIAGNOSIS — Z8719 Personal history of other diseases of the digestive system: Secondary | ICD-10-CM

## 2023-07-15 DIAGNOSIS — F17210 Nicotine dependence, cigarettes, uncomplicated: Secondary | ICD-10-CM | POA: Diagnosis present

## 2023-07-15 DIAGNOSIS — I4891 Unspecified atrial fibrillation: Secondary | ICD-10-CM | POA: Diagnosis present

## 2023-07-15 DIAGNOSIS — I4819 Other persistent atrial fibrillation: Principal | ICD-10-CM | POA: Diagnosis present

## 2023-07-15 DIAGNOSIS — J449 Chronic obstructive pulmonary disease, unspecified: Secondary | ICD-10-CM | POA: Diagnosis present

## 2023-07-15 DIAGNOSIS — Z006 Encounter for examination for normal comparison and control in clinical research program: Secondary | ICD-10-CM

## 2023-07-15 DIAGNOSIS — I129 Hypertensive chronic kidney disease with stage 1 through stage 4 chronic kidney disease, or unspecified chronic kidney disease: Secondary | ICD-10-CM | POA: Diagnosis present

## 2023-07-15 DIAGNOSIS — I480 Paroxysmal atrial fibrillation: Secondary | ICD-10-CM

## 2023-07-15 DIAGNOSIS — N1832 Chronic kidney disease, stage 3b: Secondary | ICD-10-CM | POA: Diagnosis present

## 2023-07-15 DIAGNOSIS — D509 Iron deficiency anemia, unspecified: Secondary | ICD-10-CM

## 2023-07-15 DIAGNOSIS — Z95818 Presence of other cardiac implants and grafts: Secondary | ICD-10-CM

## 2023-07-15 DIAGNOSIS — E1122 Type 2 diabetes mellitus with diabetic chronic kidney disease: Secondary | ICD-10-CM | POA: Diagnosis present

## 2023-07-15 DIAGNOSIS — E785 Hyperlipidemia, unspecified: Secondary | ICD-10-CM | POA: Diagnosis present

## 2023-07-15 DIAGNOSIS — E119 Type 2 diabetes mellitus without complications: Principal | ICD-10-CM

## 2023-07-15 DIAGNOSIS — I2510 Atherosclerotic heart disease of native coronary artery without angina pectoris: Secondary | ICD-10-CM | POA: Diagnosis present

## 2023-07-15 DIAGNOSIS — Z720 Tobacco use: Secondary | ICD-10-CM | POA: Diagnosis present

## 2023-07-15 HISTORY — PX: LEFT ATRIAL APPENDAGE OCCLUSION: EP1229

## 2023-07-15 HISTORY — DX: Presence of other cardiac implants and grafts: Z95.818

## 2023-07-15 HISTORY — PX: TEE WITHOUT CARDIOVERSION: SHX5443

## 2023-07-15 LAB — POCT ACTIVATED CLOTTING TIME: Activated Clotting Time: 421 s

## 2023-07-15 LAB — CBC
HCT: 41 % (ref 39.0–52.0)
Hemoglobin: 13.2 g/dL (ref 13.0–17.0)
MCH: 32.4 pg (ref 26.0–34.0)
MCHC: 32.2 g/dL (ref 30.0–36.0)
MCV: 100.5 fL — ABNORMAL HIGH (ref 80.0–100.0)
Platelets: 125 10*3/uL — ABNORMAL LOW (ref 150–400)
RBC: 4.08 MIL/uL — ABNORMAL LOW (ref 4.22–5.81)
RDW: 13.7 % (ref 11.5–15.5)
WBC: 7.6 10*3/uL (ref 4.0–10.5)
nRBC: 0 % (ref 0.0–0.2)

## 2023-07-15 LAB — ECHO TEE
MV M vel: 4.21 m/s
MV Peak grad: 70.9 mm[Hg]

## 2023-07-15 LAB — SURGICAL PCR SCREEN
MRSA, PCR: NEGATIVE
Staphylococcus aureus: NEGATIVE

## 2023-07-15 LAB — COMPREHENSIVE METABOLIC PANEL
ALT: 22 U/L (ref 0–44)
AST: 25 U/L (ref 15–41)
Albumin: 3.6 g/dL (ref 3.5–5.0)
Alkaline Phosphatase: 38 U/L (ref 38–126)
Anion gap: 12 (ref 5–15)
BUN: 20 mg/dL (ref 8–23)
CO2: 24 mmol/L (ref 22–32)
Calcium: 10 mg/dL (ref 8.9–10.3)
Chloride: 103 mmol/L (ref 98–111)
Creatinine, Ser: 1.75 mg/dL — ABNORMAL HIGH (ref 0.61–1.24)
GFR, Estimated: 40 mL/min — ABNORMAL LOW (ref >60)
Glucose, Bld: 122 mg/dL — ABNORMAL HIGH (ref 70–99)
Potassium: 4.1 mmol/L (ref 3.5–5.1)
Sodium: 139 mmol/L (ref 135–145)
Total Bilirubin: 0.8 mg/dL (ref 0.3–1.2)
Total Protein: 6.3 g/dL — ABNORMAL LOW (ref 6.5–8.1)

## 2023-07-15 LAB — GLUCOSE, CAPILLARY
Glucose-Capillary: 120 mg/dL — ABNORMAL HIGH (ref 70–99)
Glucose-Capillary: 139 mg/dL — ABNORMAL HIGH (ref 70–99)
Glucose-Capillary: 111 mg/dL — ABNORMAL HIGH (ref 70–99)
Glucose-Capillary: 109 mg/dL — ABNORMAL HIGH (ref 70–99)
Glucose-Capillary: 231 mg/dL — ABNORMAL HIGH (ref 70–99)

## 2023-07-15 LAB — TYPE AND SCREEN
ABO/RH(D): O POS
Antibody Screen: NEGATIVE

## 2023-07-15 SURGERY — LEFT ATRIAL APPENDAGE OCCLUSION
Anesthesia: General

## 2023-07-15 MED ORDER — ONDANSETRON HCL 4 MG/2ML IJ SOLN
INTRAMUSCULAR | Status: DC | PRN
  Administered 2023-07-15: 4 mg via INTRAVENOUS

## 2023-07-15 MED ORDER — IOHEXOL 350 MG/ML SOLN
INTRAVENOUS | Status: DC | PRN
  Administered 2023-07-15: 20 mL

## 2023-07-15 MED ORDER — DULOXETINE HCL 60 MG PO CPEP
60.0000 mg | ORAL_CAPSULE | Freq: Every evening | ORAL | Status: DC
  Administered 2023-07-15: 60 mg via ORAL
  Filled 2023-07-15: qty 1

## 2023-07-15 MED ORDER — FINASTERIDE 5 MG PO TABS
5.0000 mg | ORAL_TABLET | Freq: Every day | ORAL | Status: DC
  Administered 2023-07-15 – 2023-07-16 (×2): 5 mg via ORAL
  Filled 2023-07-15 (×3): qty 1

## 2023-07-15 MED ORDER — GABAPENTIN 300 MG PO CAPS
600.0000 mg | ORAL_CAPSULE | Freq: Two times a day (BID) | ORAL | Status: DC
  Administered 2023-07-15 – 2023-07-16 (×2): 600 mg via ORAL
  Filled 2023-07-15 (×3): qty 2

## 2023-07-15 MED ORDER — HEPARIN (PORCINE) IN NACL 2000-0.9 UNIT/L-% IV SOLN
INTRAVENOUS | Status: DC | PRN
  Administered 2023-07-15 (×2): 1000 mL

## 2023-07-15 MED ORDER — PROPOFOL 10 MG/ML IV BOLUS
INTRAVENOUS | Status: DC | PRN
  Administered 2023-07-15: 150 mg via INTRAVENOUS

## 2023-07-15 MED ORDER — SODIUM CHLORIDE 0.9 % IV SOLN: INTRAVENOUS | Status: DC

## 2023-07-15 MED ORDER — APIXABAN 5 MG PO TABS
5.0000 mg | ORAL_TABLET | Freq: Two times a day (BID) | ORAL | Status: DC
  Administered 2023-07-15 – 2023-07-16 (×2): 5 mg via ORAL
  Filled 2023-07-15 (×3): qty 1

## 2023-07-15 MED ORDER — CEFAZOLIN SODIUM-DEXTROSE 2-4 GM/100ML-% IV SOLN
INTRAVENOUS | Status: AC
  Filled 2023-07-15: qty 100

## 2023-07-15 MED ORDER — PROPOFOL 500 MG/50ML IV EMUL
INTRAVENOUS | Status: DC | PRN
  Administered 2023-07-15: 115 ug/kg/min via INTRAVENOUS

## 2023-07-15 MED ORDER — CEFAZOLIN SODIUM-DEXTROSE 2-4 GM/100ML-% IV SOLN
2.0000 g | INTRAVENOUS | Status: AC
Start: 2023-07-15 — End: 2023-07-15
  Administered 2023-07-15: 2 g via INTRAVENOUS

## 2023-07-15 MED ORDER — CARVEDILOL 3.125 MG PO TABS
3.1250 mg | ORAL_TABLET | Freq: Two times a day (BID) | ORAL | Status: DC
  Administered 2023-07-16: 3.125 mg via ORAL
  Filled 2023-07-15 (×2): qty 1

## 2023-07-15 MED ORDER — ALBUTEROL SULFATE (2.5 MG/3ML) 0.083% IN NEBU: 3.0000 mL | INHALATION_SOLUTION | RESPIRATORY_TRACT | Status: DC | PRN

## 2023-07-15 MED ORDER — ACETAMINOPHEN 325 MG PO TABS
650.0000 mg | ORAL_TABLET | ORAL | Status: DC | PRN
  Administered 2023-07-15: 650 mg via ORAL
  Filled 2023-07-15: qty 2

## 2023-07-15 MED ORDER — SODIUM CHLORIDE 0.9% FLUSH
10.0000 mL | Freq: Two times a day (BID) | INTRAVENOUS | Status: DC
  Administered 2023-07-15 – 2023-07-16 (×2): 10 mL via INTRAVENOUS

## 2023-07-15 MED ORDER — CHLORHEXIDINE GLUCONATE 0.12 % MT SOLN
15.0000 mL | Freq: Once | OROMUCOSAL | Status: AC
  Administered 2023-07-15: 15 mL via OROMUCOSAL
  Filled 2023-07-15: qty 15

## 2023-07-15 MED ORDER — ROSUVASTATIN CALCIUM 20 MG PO TABS
40.0000 mg | ORAL_TABLET | Freq: Every day | ORAL | Status: DC
  Administered 2023-07-15: 40 mg via ORAL
  Filled 2023-07-15: qty 2

## 2023-07-15 MED ORDER — SUGAMMADEX SODIUM 200 MG/2ML IV SOLN
INTRAVENOUS | Status: DC | PRN
  Administered 2023-07-15: 200 mg via INTRAVENOUS

## 2023-07-15 MED ORDER — LOSARTAN POTASSIUM 25 MG PO TABS
25.0000 mg | ORAL_TABLET | Freq: Every day | ORAL | Status: DC
  Administered 2023-07-16: 25 mg via ORAL
  Filled 2023-07-15 (×2): qty 1

## 2023-07-15 MED ORDER — HEPARIN SODIUM (PORCINE) 1000 UNIT/ML IJ SOLN
INTRAMUSCULAR | Status: DC | PRN
Start: 2023-07-15 — End: 2023-07-15
  Administered 2023-07-15: 11000 [IU] via INTRAVENOUS

## 2023-07-15 MED ORDER — ONDANSETRON HCL 4 MG/2ML IJ SOLN: 4.0000 mg | Freq: Four times a day (QID) | INTRAMUSCULAR | Status: DC | PRN

## 2023-07-15 MED ORDER — PROTAMINE SULFATE 10 MG/ML IV SOLN
INTRAVENOUS | Status: DC | PRN
  Administered 2023-07-15: 30 mg via INTRAVENOUS

## 2023-07-15 MED ORDER — PHENYLEPHRINE 80 MCG/ML (10ML) SYRINGE FOR IV PUSH (FOR BLOOD PRESSURE SUPPORT)
PREFILLED_SYRINGE | INTRAVENOUS | Status: DC | PRN
  Administered 2023-07-15 (×2): 80 ug via INTRAVENOUS

## 2023-07-15 MED ORDER — TRAZODONE HCL 150 MG PO TABS
150.0000 mg | ORAL_TABLET | Freq: Every day | ORAL | Status: DC
  Administered 2023-07-15: 150 mg via ORAL
  Filled 2023-07-15 (×2): qty 1

## 2023-07-15 MED ORDER — FENTANYL CITRATE (PF) 250 MCG/5ML IJ SOLN
INTRAMUSCULAR | Status: DC | PRN
  Administered 2023-07-15: 100 ug via INTRAVENOUS

## 2023-07-15 MED ORDER — EPHEDRINE SULFATE-NACL 50-0.9 MG/10ML-% IV SOSY
PREFILLED_SYRINGE | INTRAVENOUS | Status: DC | PRN
  Administered 2023-07-15: 10 mg via INTRAVENOUS

## 2023-07-15 MED ORDER — SODIUM CHLORIDE 0.9% FLUSH: 3.0000 mL | INTRAVENOUS | Status: DC | PRN

## 2023-07-15 MED ORDER — PHENYLEPHRINE HCL-NACL 20-0.9 MG/250ML-% IV SOLN
INTRAVENOUS | Status: DC | PRN
  Administered 2023-07-15: 30 ug/min via INTRAVENOUS

## 2023-07-15 MED ORDER — SODIUM CHLORIDE 0.9% FLUSH
3.0000 mL | Freq: Two times a day (BID) | INTRAVENOUS | Status: DC
  Administered 2023-07-15 – 2023-07-16 (×2): 3 mL via INTRAVENOUS

## 2023-07-15 MED ORDER — ROCURONIUM BROMIDE 10 MG/ML (PF) SYRINGE
PREFILLED_SYRINGE | INTRAVENOUS | Status: DC | PRN
  Administered 2023-07-15: 50 mg via INTRAVENOUS

## 2023-07-15 MED ORDER — LIDOCAINE 2% (20 MG/ML) 5 ML SYRINGE
INTRAMUSCULAR | Status: DC | PRN
  Administered 2023-07-15: 60 mg via INTRAVENOUS

## 2023-07-15 MED ORDER — CHLORHEXIDINE GLUCONATE 0.12 % MT SOLN
OROMUCOSAL | Status: AC
  Filled 2023-07-15: qty 15

## 2023-07-15 MED ORDER — INSULIN ASPART 100 UNIT/ML IJ SOLN: 0.0000 [IU] | Freq: Three times a day (TID) | INTRAMUSCULAR | Status: DC

## 2023-07-15 SURGICAL SUPPLY — 17 items
BLANKET WARM UNDERBOD FULL ACC (MISCELLANEOUS) ×1
CATH DIAG 6FR PIGTAIL ANGLED (CATHETERS) ×1
CLOSURE PERCLOSE PROSTYLE (VASCULAR PRODUCTS) ×2
DILATOR VESSEL 38 20CM 12FR (INTRODUCER) ×1
KIT SHEA VERSACROSS LAAC CONNE (KITS) ×1
PACK CARDIAC CATHETERIZATION (CUSTOM PROCEDURE TRAY) ×1
PAD DEFIB RADIO PHYSIO CONN (PAD) ×1
SHEATH PERFORMER 16FR 30 (SHEATH) ×1
SHEATH PINNACLE 8F 10CM (SHEATH) ×1
SHEATH PROBE COVER 6X72 (BAG) ×1
SHIELD RADPAD SCOOP 12X17 (MISCELLANEOUS) ×1
SYS WATCHMAN FXD DBL (SHEATH) ×1
TRANSDUCER W/STOPCOCK (MISCELLANEOUS) ×1
TUBING CIL FLEX 10 FLL-RA (TUBING) ×1
WATCHMAN FLX PRO 27 (Prosthesis & Implant Heart) ×1 IMPLANT
WATCHMAN FLX PRO PROCEDURE (KITS) ×1
WATCHMAN FXD CRV SYS PROCEDURE (KITS) ×1

## 2023-07-15 NOTE — Transfer of Care (Signed)
Immediate Anesthesia Transfer of Care Note  Patient: Nicolas Berry  Procedure(s) Performed: LEFT ATRIAL APPENDAGE OCCLUSION TRANSESOPHAGEAL ECHOCARDIOGRAM  Patient Location: Cath Lab  Anesthesia Type:General  Level of Consciousness: drowsy and patient cooperative  Airway & Oxygen Therapy: Patient Spontanous Breathing and Patient connected to nasal cannula oxygen  Post-op Assessment: Report given to RN and Post -op Vital signs reviewed and stable  Post vital signs: Reviewed and stable  Last Vitals:  Vitals Value Taken Time  BP 95/67 07/15/23 1340  Temp    Pulse 64 07/15/23 1342  Resp 18 07/15/23 1342  SpO2 96 % 07/15/23 1342  Vitals shown include unfiled device data.  Last Pain:  Vitals:   07/15/23 0936  TempSrc:   PainSc: 0-No pain         Complications: There were no known notable events for this encounter.

## 2023-07-15 NOTE — Progress Notes (Addendum)
  [facility]    Patient doing well s/p LAAO. He is hemodynamically stable. Groin sites stable although Dr. Lalla Brothers needed to place site stitch post procedure. Given this, the patient will stay overnight with hopeful discharge in the morning.    ADDEND: Pressure dressing removed per Dr. Lalla Brothers. No recurrent bleeding. Groin remains soft with no evidence of hematoma. Plan to leave stitch in place overnight and remove tomorrow prior to discharge. Bedrest extended from 4 to 6 hours. Can gradually increase HOB to assist with urination if needed. Groin bracing reviewed with the patient.   Georgie Chard NP-C Structural Heart Team  Phone: [phone]

## 2023-07-15 NOTE — Discharge Summary (Signed)
[facility]    Patient ID: Nicolas Berry,  MRN: [id number], DOB/AGE: 11/24/1945 77 y.o.  Admit date: 07/15/2023 Discharge date: 07/16/2023  Primary Care Physician: Doreatha Massed, MD  Primary Cardiologist: Dina Rich, MD  Electrophysiologist: Lanier Prude, MD  Primary Discharge Diagnosis:  Paroxysmal Atrial Fibrillation Poor candidacy for long term anticoagulation due to h/o GI bleeding  Procedures This Admission:  Transeptal Puncture Intra-procedural TEE which showed no LAA thrombus Left atrial appendage occlusive device placement on 07/15/23 by Dr. Lalla Brothers.   This study demonstrated:  CONCLUSIONS:  1.Successful implantation of a WATCHMAN left atrial appendage occlusive device    2. TEE demonstrating no LAA thrombus 3. No early apparent complications.    Post Implant Anticoagulation Strategy: Continue Eliquis 5mg  by mouth twice daily for 45 days after implant. After 45 days, stop Eliquis and start Plavix 75mg  by mouth once daily to complete 6 months of post implant therapy. Plan for CT scan 60 days after implant to assess appendage patency and Watchman position.   Brief HPI: Nicolas Berry is a 77 y.o. male with a history of CAD, COPD, HTN, HLD, DM2, CKD Stage IIIb, and paroxsymal atrial fibrillation with hx of GI bleeding and anemia while on anticoagulation.   Nicolas Berry is followed by Dr. Wyline Mood for his cardiology care. [facility] was [facility] and controlled with carvedilol and Eliquis for stroke prevention. Unfortunately he has suffered several GI bleeds requiring hospital admission and transfusion. GI workup with chronic gastritis positive for intestinal metaplasia and Schatzki's ring, mild non erosive antral gastropathy (negative for HP), normal visualized duodenum, but no blood or bleeding lesions seen throughout. Follow up colonoscopy 12/23 revealed few non-advanced adenomas, old melena in the colon, suspected of  having a small bowel bleed vs. right sided diverticular bleed.  He was then referred to Dr. Lalla Brothers for consideration of LAAO with Watchman and felt to be a good candidate to proceed.   Hospital Course:  The patient was admitted and underwent left atrial appendage occlusive device placement with 27mm Watchman FLX Pro device placed 07/15/23. He was kept overnight due to groin oozing during extubation. One stitch was placed at that time with no further issues. Stitch removed today by Dr. Lalla Brothers and site has been stable without evidence of hematoma or bleeding. Wound care and restrictions were reviewed with the patient. The patient has been scheduled for post procedure follow up with Georgie Chard, NP in approximately 1 month. He was restarted on Eliquis and will continue for 45 days (12/1) at which time he will stop Eliquis and start Plavix 75mg  to complete 6 months of therapy (01/13/24). He does not require dental SBE as he has full upper and lower dentures. A repeat CT will be performed in approximately 60 days to ensure proper seal of the device.   Patients Cr bumped to 1.75 on AM labs. He follows with nephrology and was seen recently. I will have him set up for repeat labs near his home next week. If Cr remains elevated will have him hold Losartan.   Physical Exam: Vitals:   07/15/23 2000 07/15/23 2249 07/16/23 0400 07/16/23 0723  BP: 117/80 104/62 107/67 127/67  Pulse: 64 76 63   Resp: 20 20 17    Temp: 98.1 F (36.7 C) 97.9 F (36.6 C) 97.9 F (36.6 C) 97.8 F (36.6 C)  TempSrc: Oral Oral Oral Oral  SpO2: 97% 95% 94%   Weight:      Height:  Labs:   Lab Results  Component Value Date   WBC 7.6 07/15/2023   HGB 13.2 07/15/2023   HCT 41.0 07/15/2023   MCV 100.5 (H) 07/15/2023   PLT 125 (L) 07/15/2023    Recent Labs  Lab 07/15/23 1000  NA 139  K 4.1  CL 103  CO2 24  BUN 20  CREATININE 1.75*  CALCIUM 10.0  PROT 6.3*  BILITOT 0.8  ALKPHOS 38  ALT 22  AST 25  GLUCOSE  122*   General: Well developed, well nourished, NAD Lungs:Clear to ausculation bilaterally. No wheezes, rales, or rhonchi. Breathing is unlabored. Cardiovascular: RRR with S1 S2. No murmurs Extremities: No edema. Neuro: Alert and oriented. No focal deficits. No facial asymmetry. MAE spontaneously. Psych: Responds to questions appropriately with normal affect.    Discharge Medications:  Allergies as of 07/16/2023       Reactions   Lorazepam Anxiety        Medication List     TAKE these medications    albuterol 108 (90 Base) MCG/ACT inhaler Commonly known as: VENTOLIN HFA Inhale 2 puffs into the lungs every 4 (four) hours as needed for wheezing or shortness of breath.   apixaban 5 MG Tabs tablet Commonly known as: ELIQUIS Take 1 tablet (5 mg total) by mouth 2 (two) times daily.   carvedilol 3.125 MG tablet Commonly known as: COREG TAKE 1 TABLET BY MOUTH TWICE DAILY STOPPING  CARDIZEM   Cholecalciferol 25 MCG (1000 UT) tablet Take 1,000 Units by mouth daily.   DULoxetine 60 MG capsule Commonly known as: CYMBALTA Take 60 mg by mouth every evening.   famotidine 20 MG tablet Commonly known as: PEPCID Take 20 mg by mouth daily.   finasteride 5 MG tablet Commonly known as: PROSCAR Take 1 tablet (5 mg total) by mouth daily.   gabapentin 300 MG capsule Commonly known as: NEURONTIN Take 600 mg by mouth 2 (two) times daily.   losartan 25 MG tablet Commonly known as: Cozaar Take 0.5 tablets (12.5 mg total) by mouth daily. What changed: how much to take   meclizine 25 MG tablet Commonly known as: ANTIVERT Take 1 tablet (25 mg total) by mouth 3 (three) times daily as needed for dizziness.   metFORMIN 500 MG tablet Commonly known as: GLUCOPHAGE Take 500 mg by mouth 2 (two) times daily with a meal.   mirabegron ER 25 MG Tb24 tablet Commonly known as: MYRBETRIQ Take 1 tablet (25 mg total) by mouth daily.   rosuvastatin 40 MG tablet Commonly known as:  CRESTOR Take 40 mg by mouth at bedtime.   traZODone 100 MG tablet Commonly known as: DESYREL Take 150 mg by mouth at bedtime.        Disposition:  Home  Discharge Instructions     Call MD for:  redness, tenderness, or signs of infection (pain, swelling, redness, odor or green/yellow discharge around incision site)   Complete by: As directed    Call MD for:  severe uncontrolled pain   Complete by: As directed    Call MD for:  temperature >100.4   Complete by: As directed    Diet - low sodium heart healthy   Complete by: As directed    Discharge instructions   Complete by: As directed    [place] Day Procedure, Care After  Procedure MD: Dr. Isidoro Donning Clinical Coordinator: Karsten Fells, RN  This sheet gives you information about how to care for yourself after your procedure. Your health care provider may also  give you more specific instructions. If you have problems or questions, contact your health care provider.  What can I expect after the procedure? After the procedure, it is common to have: Bruising around your puncture site. Tenderness around your puncture site. Tiredness (fatigue).  Medication instructions It is very important to continue to take your blood thinner as directed by your doctor after the Watchman procedure. Call your procedure doctor's office with question or concerns. If you are on Coumadin (warfarin), you will have your INR checked the week after your procedure, with a goal INR of 2.0 - 3.0. Please follow your medication instructions on your discharge summary. Only take the medications listed on your discharge paperwork.  Follow up You will be seen in 1 month after your procedure You will have a repeat CT scan approximately 8 weeks after your procedure mark to check your device You will follow up the MD/APP who performed your procedure 6 months after your procedure The Watchman Clinical Coordinator will check in with you from time to time, including  1 and 2 years after your procedure.    Follow these instructions at home: Puncture site care  Follow instructions from your health care provider about how to take care of your puncture site. Make sure you: If present, leave stitches (sutures), skin glue, or adhesive strips in place.  If a large square bandage is present, this may be removed 24 hours after surgery.  Check your puncture site every day for signs of infection. Check for: Redness, swelling, or pain. Fluid or blood. If your puncture site starts to bleed, lie down on your back, apply firm pressure to the area, and contact your health care provider. Warmth. Pus or a bad smell. Driving Do not drive yourself home if you received sedation Do not drive for at least 4 days after your procedure or however long your health care provider recommends. (Do not resume driving if you have previously been instructed not to drive for other health reasons.) Do not spend greater than 1 hour at a time in a car for the first 3 days. Stop and take a break with a 5 minute walk at least every hour.  Do not drive or use heavy machinery while taking prescription pain medicine.  Activity Avoid activities that take a lot of effort, including exercise, for at least 7 days after your procedure. For the first 3 days, avoid sitting for longer than one hour at a time.  Avoid alcoholic beverages, signing paperwork, or participating in legal proceedings for 24 hours after receiving sedation Do not lift anything that is heavier than 10 lb (4.5 kg) for one week.  No sexual activity for 1 week.  Return to your normal activities as told by your health care provider. Ask your health care provider what activities are safe for you. General instructions Take over-the-counter and prescription medicines only as told by your health care provider. Do not use any products that contain nicotine or tobacco, such as cigarettes and e-cigarettes. If you need help quitting, ask  your health care provider. You may shower after 24 hours, but Do not take baths, swim, or use a hot tub for 1 week.  Do not drink alcohol for 24 hours after your procedure. Keep all follow-up visits as told by your health care provider. This is important. Dental Work: You will require antibiotics prior to any dental work, including cleanings, for 6 months after your Watchman implantation to help protect you from infection. After 6 months,  antibiotics are no longer required. Contact a health care provider if: You have redness, mild swelling, or pain around your puncture site. You have soreness in your throat or at your puncture site that does not improve after several days You have fluid or blood coming from your puncture site that stops after applying firm pressure to the area. Your puncture site feels warm to the touch. You have pus or a bad smell coming from your puncture site. You have a fever. You have chest pain or discomfort that spreads to your neck, jaw, or arm. You are sweating a lot. You feel nauseous. You have a fast or irregular heartbeat. You have shortness of breath. You are dizzy or light-headed and feel the need to lie down. You have pain or numbness in the arm or leg closest to your puncture site. Get help right away if: Your puncture site suddenly swells. Your puncture site is bleeding and the bleeding does not stop after applying firm pressure to the area. These symptoms may represent a serious problem that is an emergency. Do not wait to see if the symptoms will go away. Get medical help right away. Call your local emergency services (911 in the [place] Do not drive yourself to the hospital. Summary After the procedure, it is normal to have bruising and tenderness at the puncture site in your groin, neck, or forearm. Check your puncture site every day for signs of infection. Get help right away if your puncture site is bleeding and the bleeding does not stop after  applying firm pressure to the area. This is a medical emergency.  This information is not intended to replace advice given to you by your health care provider. Make sure you discuss any questions you have with your health care provider.   Increase activity slowly   Complete by: As directed        Follow-up Information     Filbert Schilder, NP Follow up on 08/23/2023.   Specialty: Cardiology Why: at 9:30AM. Please arrive by 9:15AM. Contact information: [address] [place] [address] [phone]                 Duration of Discharge Encounter: Greater than 30 minutes including physician time.  Signed, Georgie Chard, NP  07/16/2023 10:15 AM

## 2023-07-15 NOTE — Discharge Instructions (Signed)

## 2023-07-15 NOTE — Anesthesia Procedure Notes (Signed)
Procedure Name: Intubation Date/Time: 07/15/2023 12:11 PM  Performed by: Gus Puma, CRNAPre-anesthesia Checklist: Patient identified, Emergency Drugs available, Suction available and Patient being monitored Patient Re-evaluated:Patient Re-evaluated prior to induction Oxygen Delivery Method: Circle System Utilized Preoxygenation: Pre-oxygenation with 100% oxygen Induction Type: IV induction Ventilation: Mask ventilation without difficulty Laryngoscope Size: Mac and 4 Grade View: Grade I Tube type: Oral Tube size: 7.5 mm Number of attempts: 1 Airway Equipment and Method: Stylet and Oral airway Placement Confirmation: ETT inserted through vocal cords under direct vision, positive ETCO2 and breath sounds checked- equal and bilateral Secured at: 23 cm Tube secured with: Tape Dental Injury: Teeth and Oropharynx as per pre-operative assessment

## 2023-07-15 NOTE — Anesthesia Preprocedure Evaluation (Signed)
Anesthesia Evaluation  Patient identified by MRN, date of birth, ID band Patient awake    Reviewed: Allergy & Precautions, NPO status , Patient's Chart, lab work & pertinent test results  History of Anesthesia Complications Negative for: history of anesthetic complications  Airway Mallampati: II  TM Distance: >3 FB Neck ROM: Full    Dental  (+) Edentulous Upper, Edentulous Lower, Dental Advisory Given   Pulmonary shortness of breath, neg sleep apnea, COPD,  COPD inhaler, neg recent URI, Current Smoker and Patient abstained from smoking.   breath sounds clear to auscultation       Cardiovascular hypertension, Pt. on medications (-) angina + CAD  (-) Past MI + dysrhythmias Atrial Fibrillation  Rhythm:Regular   1. Left ventricular ejection fraction, by estimation, is 60 to 65%. The  left ventricle has normal function. The left ventricle has no regional  wall motion abnormalities. Left ventricular diastolic parameters are  consistent with Grade I diastolic  dysfunction (impaired relaxation).   2. Right ventricular systolic function is normal. The right ventricular  size is normal. There is mildly elevated pulmonary artery systolic  pressure.   3. The mitral valve is abnormal. Trivial mitral valve regurgitation. No  evidence of mitral stenosis. Severe mitral annular calcification.   4. The aortic valve is tricuspid. There is moderate calcification of the  aortic valve. Aortic valve regurgitation is not visualized. Aortic valve  sclerosis/calcification is present, without any evidence of aortic  stenosis. Aortic valve mean gradient  measures 10.0 mmHg. Aortic valve Vmax measures 2.02 m/s.   5. The inferior vena cava is normal in size with greater than 50%  respiratory variability, suggesting right atrial pressure of 3 mmHg.     Neuro/Psych  Headaches PSYCHIATRIC DISORDERS Anxiety        GI/Hepatic Neg liver ROS,GERD  Medicated and  Controlled,,  Endo/Other  diabetes, Oral Hypoglycemic Agents  Lab Results      Component                Value               Date                      HGBA1C                   5.3                 06/05/2022             Renal/GU Renal InsufficiencyRenal diseaseLab Results      Component                Value               Date                      NA                       139                 07/15/2023                K                        4.1                 07/15/2023  CO2                      24                  07/15/2023                GLUCOSE                  122 (H)             07/15/2023                BUN                      20                  07/15/2023                CREATININE               1.75 (H)            07/15/2023                CALCIUM                  10.0                07/15/2023                EGFR                     45 (L)              04/08/2023                GFRNONAA                 40 (L)              07/15/2023                Musculoskeletal  (+) Arthritis ,    Abdominal   Peds  Hematology  (+) Blood dyscrasia Lab Results      Component                Value               Date                      WBC                      7.6                 07/15/2023                HGB                      13.2                07/15/2023                HCT                      41.0                07/15/2023                MCV  100.5 (H)           07/15/2023                PLT                      125 (L)             07/15/2023            eliquis   Anesthesia Other Findings   Reproductive/Obstetrics                              Anesthesia Physical Anesthesia Plan  ASA: 3  Anesthesia Plan: General   Post-op Pain Management: Minimal or no pain anticipated   Induction:   PONV Risk Score and Plan: 1 and Ondansetron, Dexamethasone, Propofol infusion and TIVA  Airway Management Planned: Oral  ETT  Additional Equipment: None  Intra-op Plan:   Post-operative Plan: Extubation in OR  Informed Consent: I have reviewed the patients History and Physical, chart, labs and discussed the procedure including the risks, benefits and alternatives for the proposed anesthesia with the patient or authorized representative who has indicated his/her understanding and acceptance.     Dental advisory given  Plan Discussed with: CRNA  Anesthesia Plan Comments:          Anesthesia Quick Evaluation

## 2023-07-15 NOTE — Plan of Care (Signed)
  Problem: Education: Goal: Knowledge of General Education information will improve Description Including pain rating scale, medication(s)/side effects and non-pharmacologic comfort measures Outcome: Progressing   Problem: Clinical Measurements: Goal: Will remain free from infection Outcome: Progressing   Problem: Nutrition: Goal: Adequate nutrition will be maintained Outcome: Progressing   Problem: Coping: Goal: Level of anxiety will decrease Outcome: Progressing   

## 2023-07-15 NOTE — H&P (Signed)
Electrophysiology Office Note:     Date:  07/15/2023    ID:  Nicolas Berry, DOB 1945-12-15, MRN [id number]   [facility] Cardiologist:  Dina Rich, MD  [facility] Electrophysiologist:  Lanier Prude, MD    Referring MD: Doreatha Massed, MD    Chief Complaint: Atrial fibrillation   History of Present Illness:     Nicolas Berry is a 77 y.o. malewho I am seeing today for an evaluation of atrial fibrillation at the request of Dr. Wyline Mood.   The patient was last seen by Dr. Wyline Mood on February 25, 2023.   The patient has a medical history that includes atrial fibrillation, GI bleeding, coronary artery disease, COPD, hypertension, hyperlipidemia.   The patient was recently admitted to [facility] in December 2023 for acute blood loss anemia and melena.  He had another GI bleeding episode in January 2022.   Today he is recovering nicely from his hospitalization.  He is feeling better.  He has a capsule endoscopy scheduled soon he is back on his Eliquis twice daily without recurrent bleeding issues that he is aware of.   Presents for LAAO today. Procedure reviewed. He has been taking his anticoagulant without recurrent bleeding.     Objective Their past medical, social and family history was reveiwed.     ROS:   Please see the history of present illness.    All other systems reviewed and are negative.   EKGs/Labs/Other Studies Reviewed:     The following studies were reviewed today:   November 2021 echo EF 60% RV function normal     February 25, 2023 EKG shows sinus rhythm June 05, 2022 EKG shows sinus rhythm May 17, 2016 EKG shows atrial fibrillation with rapid ventricular rates        Physical Exam:     VS:  BP 151/72   Pulse 58   Ht 5\' 10"  (1.778 m)   Wt 160 lb 12.8 oz (72.9 kg)   SpO2 97%   BMI 23.07 kg/m         Wt Readings from Last 3 Encounters:  04/08/23 160 lb 12.8 oz (72.9 kg)  02/25/23 151 lb (68.5 kg)  11/13/22 148 lb 9.6 oz  (67.4 kg)      GEN:  Well nourished, well developed in no acute distress CARDIAC: RRR, no murmurs, rubs, gallops RESPIRATORY:  Clear to auscultation without rales, wheezing or rhonchi          Assessment ASSESSMENT AND PLAN:     1. PAF (paroxysmal atrial fibrillation) (HCC)   2. Essential hypertension   3. H/O: GI bleed       #Paroxysmal atrial fibrillation #GI bleeding On Eliquis for stroke prophylaxis but desires a stroke risk mitigation strategy that avoids long-term exposure anticoagulation.  I think this is very reasonable. --------------------------   I have seen Nicolas Berry in the office today who is being considered for a Watchman left atrial appendage closure device. I believe they will benefit from this procedure given their history of atrial fibrillation, CHA2DS2-VASc score of 5 and unadjusted ischemic stroke rate of 7.2% per year. Unfortunately, the patient is not felt to be a long term anticoagulation candidate secondary to GI bleeding. The patient's chart has been reviewed and I feel that they would be a candidate for short term oral anticoagulation after Watchman implant.    It is my belief that after undergoing a LAA closure procedure, Nicolas Berry will not need long term anticoagulation which eliminates anticoagulation side effects  and major bleeding risk.    Procedural risks for the Watchman implant have been reviewed with the patient including a 0.5% risk of stroke, <1% risk of perforation and <1% risk of device embolization. Other risks include bleeding, vascular damage, tamponade, worsening renal function, and death. The patient understands these risk and wishes to proceed.       The published clinical data on the safety and effectiveness of WATCHMAN include but are not limited to the following: - Holmes DR, Everlene Farrier, Sick P et al. for the PROTECT AF Investigators. Percutaneous closure of the left atrial appendage versus warfarin therapy for prevention of  stroke in patients with atrial fibrillation: a randomised non-inferiority trial. Lancet 2009; 374: 534-42. Everlene Farrier, Doshi SK, Isa Rankin D et al. on behalf of the PROTECT AF Investigators. Percutaneous Left Atrial Appendage Closure for Stroke Prophylaxis in Patients With Atrial Fibrillation 2.3-Year Follow-up of the PROTECT AF (Watchman Left Atrial Appendage System for Embolic Protection in Patients With Atrial Fibrillation) Trial. Circulation 2013; 127:720-729. - Alli O, Doshi S,  Kar S, Reddy VY, Sievert H et al. Quality of Life Assessment in the Randomized PROTECT AF (Percutaneous Closure of the Left Atrial Appendage Versus Warfarin Therapy for Prevention of Stroke in Patients With Atrial Fibrillation) Trial of Patients at Risk for Stroke With Nonvalvular Atrial Fibrillation. J Am Coll Cardiol 2013; 61:1790-8. Aline August DR, Mia [place], Price M, Whisenant B, Sievert H, Doshi S, Huber K, Reddy V. Prospective randomized evaluation of the Watchman left atrial appendage Device in patients with atrial fibrillation versus long-term warfarin therapy; the PREVAIL trial. Journal of the [employer] of Cardiology, Vol. 4, No. 1, 2014, 1-11. - Kar S, Doshi SK, Sadhu A, Horton R, Osorio J et al. Primary outcome evaluation of a next-generation left atrial appendage closure device: results from the PINNACLE FLX trial. Circulation 2021;143(18)1754-1762.      After today's visit with the patient which was dedicated solely for shared decision making visit regarding LAA closure device, the patient decided to proceed with the LAA appendage closure procedure scheduled to be done in the near future at [facility]. Prior to the procedure, I would like to obtain a gated CT scan of the chest with contrast timed for PV/LA visualization.    Additionally, the patient will need an updated echocardiogram.   HAS-BLED score 3 Hypertension Yes  Abnormal renal and liver function (Dialysis, transplant, Cr >2.26 mg/dL  /Cirrhosis or Bilirubin >2x Normal or AST/ALT/AP >3x Normal) No  Stroke No  Bleeding Yes  Labile INR (Unstable/high INR) No  Elderly (>65) Yes  Drugs or alcohol (>= 8 drinks/week, anti-plt or NSAID) No    CHA2DS2-VASc Score = 5  The patient's score is based upon: CHF History: 0 HTN History: 1 Diabetes History: 1 Stroke History: 0 Vascular Disease History: 1 Age Score: 2 Gender Score: 0     #Hypertension Above goal today.  Recommend checking blood pressures 1-2 times per week at home and recording the values.  Recommend bringing these recordings to the primary care physician. Continue Coreg, losartan.      Presents for LAAO/Watchman today. Procedure reviewed.       Signed, Rossie Muskrat. Lalla Brothers, MD, [facility], [facility] 07/15/2023 Electrophysiology [facility]

## 2023-07-16 ENCOUNTER — Encounter (HOSPITAL_COMMUNITY): Payer: Self-pay | Admitting: Cardiology

## 2023-07-16 LAB — GLUCOSE, CAPILLARY: Glucose-Capillary: 114 mg/dL — ABNORMAL HIGH (ref 70–99)

## 2023-07-16 MED ORDER — LOSARTAN POTASSIUM 25 MG PO TABS: 12.5000 mg | ORAL_TABLET | Freq: Every day | ORAL | 11 refills | Status: AC

## 2023-07-16 NOTE — Progress Notes (Signed)
Mobility Specialist Progress Note:   07/16/23 0900  Mobility  Activity Ambulated with assistance in hallway  Level of Assistance Contact guard assist, steadying assist  Assistive Device None  Distance Ambulated (ft) 340 ft  Activity Response Tolerated well  Mobility Referral Yes  $Mobility charge 1 Mobility  Mobility Specialist Start Time (ACUTE ONLY) [id number]  Mobility Specialist Stop Time (ACUTE ONLY) [id number]  Mobility Specialist Time Calculation (min) (ACUTE ONLY) 6 min    Received pt in chair having no complaints and agreeable to mobility. Pt was asymptomatic throughout ambulation and returned to room w/o fault. Left in chair w/ call bell in reach and all needs met.   D'Vante Earlene Plater Mobility Specialist Please contact via Special educational needs teacher or Rehab office at [phone]

## 2023-07-16 NOTE — Plan of Care (Signed)
  Problem: Education: Goal: Knowledge of cardiac device and self-care will improve Outcome: Progressing Goal: Ability to safely manage health related needs after discharge will improve Outcome: Progressing Goal: Individualized Educational Video(s) Outcome: Progressing   Problem: Cardiac: Goal: Ability to achieve and maintain adequate cardiopulmonary perfusion will improve Outcome: Progressing   Problem: Education: Goal: Knowledge of General Education information will improve Description: Including pain rating scale, medication(s)/side effects and non-pharmacologic comfort measures Outcome: Progressing   Problem: Health Behavior/Discharge Planning: Goal: Ability to manage health-related needs will improve Outcome: Progressing   Problem: Clinical Measurements: Goal: Ability to maintain clinical measurements within normal limits will improve Outcome: Progressing Goal: Will remain free from infection Outcome: Progressing Goal: Diagnostic test results will improve Outcome: Progressing Goal: Respiratory complications will improve Outcome: Progressing Goal: Cardiovascular complication will be avoided Outcome: Progressing   Problem: Activity: Goal: Risk for activity intolerance will decrease Outcome: Progressing   Problem: Nutrition: Goal: Adequate nutrition will be maintained Outcome: Progressing   Problem: Coping: Goal: Level of anxiety will decrease Outcome: Progressing   Problem: Elimination: Goal: Will not experience complications related to bowel motility Outcome: Progressing Goal: Will not experience complications related to urinary retention Outcome: Progressing   Problem: Pain Management: Goal: General experience of comfort will improve Outcome: Progressing   Problem: Safety: Goal: Ability to remain free from injury will improve Outcome: Progressing   Problem: Skin Integrity: Goal: Risk for impaired skin integrity will decrease Outcome: Progressing    Problem: Education: Goal: Ability to describe self-care measures that may prevent or decrease complications (Diabetes Survival Skills Education) will improve Outcome: Progressing Goal: Individualized Educational Video(s) Outcome: Progressing   Problem: Coping: Goal: Ability to adjust to condition or change in health will improve Outcome: Progressing   Problem: Fluid Volume: Goal: Ability to maintain a balanced intake and output will improve Outcome: Progressing   Problem: Health Behavior/Discharge Planning: Goal: Ability to identify and utilize available resources and services will improve Outcome: Progressing Goal: Ability to manage health-related needs will improve Outcome: Progressing   Problem: Metabolic: Goal: Ability to maintain appropriate glucose levels will improve Outcome: Progressing   Problem: Nutritional: Goal: Maintenance of adequate nutrition will improve Outcome: Progressing Goal: Progress toward achieving an optimal weight will improve Outcome: Progressing   Problem: Skin Integrity: Goal: Risk for impaired skin integrity will decrease Outcome: Progressing   Problem: Tissue Perfusion: Goal: Adequacy of tissue perfusion will improve Outcome: Progressing

## 2023-07-18 NOTE — Anesthesia Postprocedure Evaluation (Signed)
Anesthesia Post Note  Patient: Nicolas Berry  Procedure(s) Performed: LEFT ATRIAL APPENDAGE OCCLUSION TRANSESOPHAGEAL ECHOCARDIOGRAM     Patient location during evaluation: Cath Lab Anesthesia Type: General Level of consciousness: awake and alert Pain management: pain level controlled Vital Signs Assessment: post-procedure vital signs reviewed and stable Respiratory status: spontaneous breathing, nonlabored ventilation and respiratory function stable Cardiovascular status: blood pressure returned to baseline and stable Postop Assessment: no apparent nausea or vomiting Anesthetic complications: no   There were no known notable events for this encounter.                 Khylin Gutridge

## 2023-07-19 ENCOUNTER — Telehealth: Payer: Self-pay | Admitting: Cardiology

## 2023-07-19 ENCOUNTER — Other Ambulatory Visit: Payer: Self-pay

## 2023-07-19 ENCOUNTER — Telehealth: Payer: Self-pay

## 2023-07-19 DIAGNOSIS — I480 Paroxysmal atrial fibrillation: Secondary | ICD-10-CM

## 2023-07-19 DIAGNOSIS — Z95818 Presence of other cardiac implants and grafts: Secondary | ICD-10-CM

## 2023-07-19 NOTE — Telephone Encounter (Signed)
Patient called in concerned about waiting until 12/02 for his post watchman appointment. I advised that I did not know the protocol around scheduling those and reached out to Payton Mccallum, RN via secure chat. I was unable to speak with Orpha Bur and I told the patient that I would let her know but he stated he did not need a call back, he was OK with waiting until 12/02. He did ask about his CT and I advised him that the CT was cancelled and a TEE was scheduled for 12/18. Patient understood and again stated that he did not need a call back from [place]. Just FYI.

## 2023-07-19 NOTE — Telephone Encounter (Signed)
  [facility]   Watchman Team  Contacted the patient regarding discharge from [facility] on 07/16/2023  The patient understands to follow up with Georgie Chard on 08/23/2023. Confirmed with the patient that he will have TEE on 12/18 instead of CT.  The patient understands discharge instructions? Yes  The patient understands medications and regimen? Yes   The patient reports groin site looks bruised but improving. He reports no S/S of infection or bleeding.  The patient understands to call with any questions or concerns prior to scheduled visit.

## 2023-07-19 NOTE — Telephone Encounter (Signed)
-----   Message from Rossie Muskrat [facility] sent at 07/15/2023  7:24 PM EDT ----- Can we change the CT to a TEE given his abnormal renal function please? Thanks, Sheria Lang

## 2023-07-19 NOTE — Telephone Encounter (Signed)
Cancelled post-LAAO CT and scheduled TEE 09/08/2023.

## 2023-07-20 NOTE — Telephone Encounter (Signed)
Confirmed all appointments with patient again. He understands to call if he has any questions or concerns prior to visit and I will be happy to reschedule him to a sooner appointment. While on the phone, he asked if he could play golf 1 week post-LAAO.  Informed him that he may play golf Friday and reiterated that if he feels pressure or pain in his groin to stop. He was grateful for assistance.

## 2023-08-23 ENCOUNTER — Ambulatory Visit: Payer: No Typology Code available for payment source | Attending: Cardiology | Admitting: Cardiology

## 2023-08-23 ENCOUNTER — Other Ambulatory Visit: Payer: Self-pay | Admitting: Cardiology

## 2023-08-23 VITALS — BP 124/68 | HR 55 | Ht 70.0 in | Wt 164.2 lb

## 2023-08-23 DIAGNOSIS — I480 Paroxysmal atrial fibrillation: Secondary | ICD-10-CM | POA: Diagnosis not present

## 2023-08-23 DIAGNOSIS — I10 Essential (primary) hypertension: Secondary | ICD-10-CM

## 2023-08-23 DIAGNOSIS — Z95818 Presence of other cardiac implants and grafts: Secondary | ICD-10-CM | POA: Diagnosis not present

## 2023-08-23 DIAGNOSIS — I2510 Atherosclerotic heart disease of native coronary artery without angina pectoris: Secondary | ICD-10-CM

## 2023-08-23 DIAGNOSIS — Z8719 Personal history of other diseases of the digestive system: Secondary | ICD-10-CM

## 2023-08-23 LAB — CBC
Hematocrit: 40.5 % (ref 37.5–51.0)
Hemoglobin: 13.4 g/dL (ref 13.0–17.7)
MCH: 31.2 pg (ref 26.6–33.0)
MCHC: 33.1 g/dL (ref 31.5–35.7)
MCV: 94 fL (ref 79–97)
Platelets: 133 10*3/uL — ABNORMAL LOW (ref 150–450)
RBC: 4.3 x10E6/uL (ref 4.14–5.80)
RDW: 13.7 % (ref 11.6–15.4)
WBC: 6.9 10*3/uL (ref 3.4–10.8)

## 2023-08-23 LAB — BASIC METABOLIC PANEL
BUN/Creatinine Ratio: 13 (ref 10–24)
BUN: 19 mg/dL (ref 8–27)
CO2: 28 mmol/L (ref 20–29)
Calcium: 10 mg/dL (ref 8.6–10.2)
Chloride: 103 mmol/L (ref 96–106)
Creatinine, Ser: 1.51 mg/dL — ABNORMAL HIGH (ref 0.76–1.27)
Glucose: 116 mg/dL — ABNORMAL HIGH (ref 70–99)
Potassium: 4.2 mmol/L (ref 3.5–5.2)
Sodium: 140 mmol/L (ref 134–144)
eGFR: 47 mL/min/{1.73_m2} — ABNORMAL LOW (ref >59)

## 2023-08-23 MED ORDER — CLOPIDOGREL BISULFATE 75 MG PO TABS: 75.0000 mg | ORAL_TABLET | Freq: Every day | ORAL | 3 refills | Status: DC

## 2023-08-23 NOTE — H&P (View-Only) (Signed)
[facility]                                     Cardiology Office Note:    Date:  08/23/2023   ID:  Nicolas Berry, DOB 1946-08-04, MRN [id number]  PCP:  Doreatha Massed, MD  [facility] Cardiologist:  Dina Rich, MD  [facility] Electrophysiologist:  Lanier Prude, MD   Referring MD: Doreatha Massed, MD   Chief Complaint  Patient presents with   1 month s/p LAAO    History of Present Illness:    Nicolas Berry is a 77 y.o. male with a hx of CAD, COPD, HTN, HLD, DM2, CKD Stage IIIb, and paroxsymal atrial fibrillation with hx of GI bleeding and anemia while on anticoagulation who is now s/p LAAO with Watchman and is here for 1 month follow up.    Mr. Jochum is followed by Dr. Wyline Mood for his cardiology care. [facility] and controlled with carvedilol and Eliquis for stroke prevention. Unfortunately he has suffered several GI bleeds requiring hospital admission and transfusion. GI workup with chronic gastritis positive for intestinal metaplasia and Schatzki's ring, mild non erosive antral gastropathy (negative for HP), normal visualized duodenum, but no blood or bleeding lesions seen throughout. Follow up colonoscopy 12/23 revealed few non-advanced adenomas, old melena in the colon, suspected of having a small bowel bleed vs. right sided diverticular bleed.   He was then referred to Dr. Lalla Brothers for consideration of LAAO with Watchman and felt to be a good candidate and is now s/p LAAO with Watchman 27mm FLX Pro device placed 07/15/23. He was restarted on Eliquis with plans to continue for 45 days (12/8) at which time he will stop Eliquis and start Plavix 75mg  to complete 6 months of therapy (01/13/24). He does not require dental SBE as he has full upper and lower dentures. A repeat TEE will be performed in approximately 60 days to ensure proper seal of the device.    He is here today alone and reports that he has been  very well with no complaints. He denies chest pain, SOB, palpitations, LE edema, orthopnea, PND, dizziness, or syncope. Denies bleeding in stool or urine.   Past Medical History:  Diagnosis Date   Anemia    Anxiety    OA multiple joints   Arthritis    Atrial fibrillation (HCC)    BPH (benign prostatic hyperplasia)    COPD (chronic obstructive pulmonary disease) (HCC)    uses inhaler   Coronary artery disease    DDD (degenerative disc disease), lumbar    Diabetes mellitus without complication (HCC)    Dyspnea    Fatty liver    GERD (gastroesophageal reflux disease)    Headache    Hyperlipidemia    Hypertension    Presence of Watchman left atrial appendage closure device 07/15/2023   27mm Watchman FLX Pro placed by Dr. Lalla Brothers    Past Surgical History:  Procedure Laterality Date   BACK SURGERY     C6-C7   BALLOON DILATION N/A 10/01/2020   Procedure: Marvis Repress DILATION;  Surgeon: Lanelle Bal, DO;  [facility];  Service: Endoscopy;  Laterality: N/A;   BIOPSY  10/01/2020   Procedure: BIOPSY;  Surgeon: Lanelle Bal, DO;  [facility]: [facility];  Service: Endoscopy;;  gastric   COLONOSCOPY WITH PROPOFOL N/A 10/01/2020   Procedure:  COLONOSCOPY WITH PROPOFOL;  Surgeon: Lanelle Bal, DO;  [facility]: [facility];  Service: Endoscopy;  Laterality: N/A;  2:15pm, pt know new time per office   ESOPHAGOGASTRODUODENOSCOPY (EGD) WITH PROPOFOL N/A 10/01/2020   Procedure: ESOPHAGOGASTRODUODENOSCOPY (EGD) WITH PROPOFOL;  Surgeon: Lanelle Bal, DO;  [facility];  Service: Endoscopy;  Laterality: N/A;   HERNIA REPAIR     left inguinal   LEFT ATRIAL APPENDAGE OCCLUSION N/A 07/15/2023   Procedure: LEFT ATRIAL APPENDAGE OCCLUSION;  Surgeon: Lanier Prude, MD;  Location: [facility];  Service: Cardiovascular;  Laterality: N/A;   POLYPECTOMY  10/01/2020   Procedure: POLYPECTOMY INTESTINAL;  Surgeon: Lanelle Bal, DO;  [facility]: [facility];   Service: Endoscopy;;   STRABISMUS SURGERY Left 10/25/2020   Procedure: STRABISMUS REPAIR LEFT EYE;  Surgeon: Verne Carrow, MD;  Location: [facility];  Service: Ophthalmology;  Laterality: Left;   TEE WITHOUT CARDIOVERSION N/A 07/15/2023   Procedure: TRANSESOPHAGEAL ECHOCARDIOGRAM;  Surgeon: Lanier Prude, MD;  Location: [facility]AS[facility] CV LAB;  Service: Cardiovascular;  Laterality: N/A;   TONSILLECTOMY     TOTAL KNEE ARTHROPLASTY Right 06/12/2022   Procedure: RIGHT TOTAL KNEE ARTHROPLASTY;  Surgeon: Kathryne Hitch, MD;  Location: [facility];  Service: Orthopedics;  Laterality: Right;    Current Medications: Current Meds  Medication Sig   albuterol (VENTOLIN HFA) 108 (90 Base) MCG/ACT inhaler Inhale 2 puffs into the lungs every 4 (four) hours as needed for wheezing or shortness of breath.   apixaban (ELIQUIS) 5 MG TABS tablet Take 1 tablet (5 mg total) by mouth 2 (two) times daily.   carvedilol (COREG) 3.125 MG tablet TAKE 1 TABLET BY MOUTH TWICE DAILY STOPPING  CARDIZEM   Cholecalciferol 25 MCG (1000 UT) tablet Take 1,000 Units by mouth daily.   clopidogrel (PLAVIX) 75 MG tablet Take 1 tablet (75 mg total) by mouth daily. START 08/30/23   DULoxetine (CYMBALTA) 60 MG capsule Take 60 mg by mouth every evening.   famotidine (PEPCID) 20 MG tablet Take 20 mg by mouth daily.   finasteride (PROSCAR) 5 MG tablet Take 1 tablet (5 mg total) by mouth daily.   gabapentin (NEURONTIN) 300 MG capsule Take 600 mg by mouth 2 (two) times daily.   losartan (COZAAR) 25 MG tablet Take 0.5 tablets (12.5 mg total) by mouth daily.   meclizine (ANTIVERT) 25 MG tablet Take 1 tablet (25 mg total) by mouth 3 (three) times daily as needed for dizziness.   metFORMIN (GLUCOPHAGE) 500 MG tablet Take 500 mg by mouth 2 (two) times daily with a meal.   mirabegron ER (MYRBETRIQ) 25 MG TB24 tablet Take 1 tablet (25 mg total) by mouth daily.   rosuvastatin (CRESTOR) 40 MG tablet Take 40 mg by mouth at  bedtime.   traZODone (DESYREL) 100 MG tablet Take 150 mg by mouth at bedtime.     Allergies:   Lorazepam   Social History   Socioeconomic History   Marital status: Married    Spouse name: Not on file   Number of children: 3   Years of education: Not on file   Highest education level: Not on file  Occupational History   Occupation: retired  Tobacco Use   Smoking status: Every Day    Current packs/day: 1.50    Average packs/day: 1.5 packs/day for 63.0 years (94.6 ttl pk-yrs)    Types: Cigarettes    Start date: 08/06/1960   Smokeless tobacco: Never  Vaping Use  Vaping status: Former  Substance and Sexual Activity   Alcohol use: Yes    Comment: rarely   Drug use: Never   Sexual activity: Not on file  Other Topics Concern   Not on file  Social History Narrative   Not on file   Social Determinants of Health   Financial Resource Strain: Low Risk  (10/15/2022)   Received from [facility], [facility]   Overall Financial Resource Strain (CARDIA)    Difficulty of Paying Living Expenses: Not very hard  Food Insecurity: No Food Insecurity (07/15/2023)   Hunger Vital Sign    Worried About Running Out of Food in the Last Year: Never true    Ran Out of Food in the Last Year: Never true  Transportation Needs: No Transportation Needs (07/15/2023)   PRAPARE - Administrator, Civil Service (Medical): No    Lack of Transportation (Non-Medical): No  Physical Activity: Not on file  Stress: Stress Concern Present (09/11/2022)   Received from [facility], [facility] Occupational [facility] - Occupational Stress Questionnaire    Feeling of Stress : Rather much  Social Connections: Unknown (09/10/2022)   Received from [facility], [facility]   Social Network    Social Network: Not on file    Family History: The patient's family history includes Cancer in his mother and sister; Diabetes in his father. There is no history of Colon  cancer.  ROS:   Please see the history of present illness.    All other systems reviewed and are negative.  EKGs/Labs/Other Studies Reviewed:    The following studies were reviewed today:  Cardiac Studies & Procedures     STRESS TESTS  NM MYOCAR MULTI W/SPECT W 05/19/2016  Narrative  There was no ST segment deviation noted during stress.  No T wave inversion was noted during stress.  The study is normal.  This is a low risk study.  The left ventricular ejection fraction is hyperdynamic (>65%).   ECHOCARDIOGRAM  ECHOCARDIOGRAM COMPLETE 04/21/2023  Narrative ECHOCARDIOGRAM REPORT    Patient Name:   CAESON GALINSKY Date of Exam: 04/21/2023 Medical Rec #:  [id number]        Height:       70.0 in Accession #:    [id number]       Weight:       160.8 lb Date of Birth:  1946/04/10       BSA:          1.902 m Patient Age:    18 years         BP:           160/80 mmHg Patient Gender: M                HR:           61 bpm. Exam Location:  [place]  Procedure: 2D Echo, Cardiac Doppler and Color Doppler  Indications:    I48.0 Paroxysmal atrial fibrillation  History:        Patient has prior history of Echocardiogram examinations, most recent 08/13/2020. COPD, Arrythmias:Atrial Fibrillation; Risk Factors:Hypertension, Diabetes, Dyslipidemia and Current Smoker.  Sonographer:    Dominica Severin RCS, RVS Referring Phys: [id number] Lanier Prude   Sonographer Comments: Global longitudinal strain was attempted. IMPRESSIONS   1. Left ventricular ejection fraction, by estimation, is 60 to 65%. The left ventricle has normal function. The left ventricle has no regional wall motion abnormalities. Left ventricular diastolic parameters  are consistent with Grade I diastolic dysfunction (impaired relaxation). 2. Right ventricular systolic function is normal. The right ventricular size is normal. There is mildly elevated pulmonary artery systolic pressure. 3. The mitral valve is abnormal.  Trivial mitral valve regurgitation. No evidence of mitral stenosis. Severe mitral annular calcification. 4. The aortic valve is tricuspid. There is moderate calcification of the aortic valve. Aortic valve regurgitation is not visualized. Aortic valve sclerosis/calcification is present, without any evidence of aortic stenosis. Aortic valve mean gradient measures 10.0 mmHg. Aortic valve Vmax measures 2.02 m/s. 5. The inferior vena cava is normal in size with greater than 50% respiratory variability, suggesting right atrial pressure of 3 mmHg.  Comparison(s): No significant change from prior study.  FINDINGS Left Ventricle: Left ventricular ejection fraction, by estimation, is 60 to 65%. The left ventricle has normal function. The left ventricle has no regional wall motion abnormalities. The left ventricular internal cavity size was normal in size. There is no left ventricular hypertrophy. Left ventricular diastolic parameters are consistent with Grade I diastolic dysfunction (impaired relaxation).  Right Ventricle: The right ventricular size is normal. No increase in right ventricular wall thickness. Right ventricular systolic function is normal. There is mildly elevated pulmonary artery systolic pressure. The tricuspid regurgitant velocity is 2.93 m/s, and with an assumed right atrial pressure of 3 mmHg, the estimated right ventricular systolic pressure is 37.3 mmHg.  Left Atrium: Left atrial size was normal in size.  Right Atrium: Right atrial size was normal in size.  Pericardium: Trivial pericardial effusion is present.  Mitral Valve: The mitral valve is abnormal. Severe mitral annular calcification. Trivial mitral valve regurgitation. No evidence of mitral valve stenosis. MV peak gradient, 8.1 mmHg. The mean mitral valve gradient is 3.0 mmHg.  Tricuspid Valve: The tricuspid valve is normal in structure. Tricuspid valve regurgitation is mild . No evidence of tricuspid stenosis.  Aortic  Valve: The aortic valve is tricuspid. There is moderate calcification of the aortic valve. Aortic valve regurgitation is not visualized. Aortic valve sclerosis/calcification is present, without any evidence of aortic stenosis. Aortic valve mean gradient measures 10.0 mmHg. Aortic valve peak gradient measures 16.3 mmHg. Aortic valve area, by VTI measures 3.07 cm.  Pulmonic Valve: The pulmonic valve was grossly normal. Pulmonic valve regurgitation is not visualized. No evidence of pulmonic stenosis.  Aorta: The aortic root and ascending aorta are structurally normal, with no evidence of dilitation.  Venous: The inferior vena cava is normal in size with greater than 50% respiratory variability, suggesting right atrial pressure of 3 mmHg.  IAS/Shunts: No atrial level shunt detected by color flow Doppler.   LEFT VENTRICLE PLAX 2D LVIDd:         4.70 cm     Diastology LVIDs:         2.90 cm     LV e' medial:    3.70 cm/s LV PW:         1.30 cm     LV E/e' medial:  26.0 LV IVS:        1.30 cm     LV e' lateral:   6.09 cm/s LVOT diam:     2.20 cm     LV E/e' lateral: 15.8 LV SV:         123 LV SV Index:   65 LVOT Area:     3.80 cm  LV Volumes (MOD) LV vol d, MOD A2C: 63.3 ml LV vol d, MOD A4C: 74.6 ml LV vol s, MOD A2C: 22.1  ml LV vol s, MOD A4C: 27.3 ml LV SV MOD A2C:     41.2 ml LV SV MOD A4C:     74.6 ml LV SV MOD BP:      46.4 ml  RIGHT VENTRICLE RV Basal diam:  3.00 cm RV Mid diam:    3.70 cm RV S prime:     20.90 cm/s TAPSE (M-mode): 2.0 cm  LEFT ATRIUM             Index        RIGHT ATRIUM           Index LA diam:        3.70 cm 1.94 cm/m   RA Area:     12.80 cm LA Vol (A2C):   47.9 ml 25.18 ml/m  RA Volume:   29.10 ml  15.30 ml/m LA Vol (A4C):   51.3 ml 26.96 ml/m LA Biplane Vol: 52.0 ml 27.33 ml/m AORTIC VALVE                     PULMONIC VALVE AV Area (Vmax):    3.33 cm      PV Vmax:       1.17 m/s AV Area (Vmean):   3.50 cm      PV Peak grad:  5.5 mmHg AV  Area (VTI):     3.07 cm AV Vmax:           202.00 cm/s AV Vmean:          134.500 cm/s AV VTI:            0.400 m AV Peak Grad:      16.3 mmHg AV Mean Grad:      10.0 mmHg LVOT Vmax:         177.00 cm/s LVOT Vmean:        124.000 cm/s LVOT VTI:          0.323 m LVOT/AV VTI ratio: 0.81  AORTA Ao Root diam: 3.50 cm Ao Asc diam:  3.50 cm  MITRAL VALVE                TRICUSPID VALVE MV Area (PHT): 2.17 cm     TR Peak grad:   34.3 mmHg MV Area VTI:   2.41 cm     TR Vmax:        293.00 cm/s MV Peak grad:  8.1 mmHg MV Mean grad:  3.0 mmHg     SHUNTS MV Vmax:       1.42 m/s     Systemic VTI:  0.32 m MV Vmean:      88.0 cm/s    Systemic Diam: 2.20 cm MV Decel Time: 350 msec MV E velocity: 96.10 cm/s MV A velocity: 146.00 cm/s MV E/A ratio:  0.66  Vishnu Priya Mallipeddi Electronically signed by Winfield Rast Mallipeddi Signature Date/Time: 04/22/2023/8:18:26 PM    Final   TEE  ECHO TEE 07/15/2023  Narrative TRANSESOPHOGEAL ECHO REPORT    Patient Name:   ARMEEN CAPULONG Date of Exam: 07/15/2023 Medical Rec #:  [id number]        Height:       70.0 in Accession #:    [id number]       Weight:       157.0 lb Date of Birth:  11-10-1945       BSA:          1.883 m Patient Age:    24 years  BP:           151/72 mmHg Patient Gender: M                HR:           58 bpm. Exam Location:  Inpatient  Procedure: Transesophageal Echo, 3D Echo, Color Doppler and Cardiac Doppler  Indications:     I48.1 Persistent atrial fibrillation  History:         Patient has prior history of Echocardiogram examinations, most recent 04/22/2023. COPD, Arrythmias:Atrial Fibrillation; Risk Factors:Hypertension, Dyslipidemia and Diabetes.  Sonographer:     Irving Burton Senior RDCS Referring Phys:  [id number] Lanier Prude Diagnosing Phys: Riley Lam MD  PROCEDURE: After discussion of the risks and benefits of a TEE, an informed consent was obtained from the patient. The transesophogeal  probe was passed without difficulty through the esophogus of the patient. Sedation performed by different physician. The patient was monitored while under deep sedation. The patient developed no complications during the procedure.  IMPRESSIONS   1. Prior to procedure, patent left atrial appendage. Maximal 2D ostial diameter 19 mm. Maximal 3D Ostial diameter 21 mm. A transeptal puncture was performed inferior and posterior to the patient's PFO. A 27 mm Watchman FLX was placed. There is a 1/3 mitral shoulder. No peridevice leak. Average compression ~ 28%. Successful Watchman placement. Left atrial size was mildly dilated. No left atrial/left atrial appendage thrombus was detected. 2. Chordal systolic anterior motion of the mitral valve. There is no significant LVOT obstruction during anesthesia procedure, there is LVOT flow accleration. There is papillary muscle hypertrophy. Left ventricular ejection fraction, by estimation, is 60 to 65%. The left ventricle has normal function. There is moderate concentric left ventricular hypertrophy. 3. There is prolapse of the medial portion of the A2 segment. At presenting blood pressure there is late systolic flow reversal of the left upper pulmonary vein spectal Doppler. Mitral regurgitation appears moderate during hyoptension. Less than 1 cm posterior leaflet length and valve calcification would make procedural intervention with mTEER technically challenging. 3D MVA 4.6 cm2. The mitral valve is degenerative. Moderate to severe mitral valve regurgitation. No evidence of mitral stenosis. There is severe holosystolic prolapse of of the mitral valve. The mean mitral valve gradient is 2.0 mmHg with average heart rate of 58 bpm. 4. Right ventricular systolic function is normal. The right ventricular size is normal. 5. Right atrial size was mildly dilated. 6. The aortic valve is tricuspid. Aortic valve regurgitation is not visualized. Aortic valve  sclerosis/calcification is present, without any evidence of aortic stenosis. 7. There is Moderate (Grade III) plaque involving the aortic arch. 8. Evidence of atrial level shunting detected by color flow Doppler.  FINDINGS Left Ventricle: Chordal systolic anterior motion of the mitral valve. There is no significant LVOT obstruction during anesthesia procedure, there is LVOT flow accleration. There is papillary muscle hypertrophy. Left ventricular ejection fraction, by estimation, is 60 to 65%. The left ventricle has normal function. The left ventricular internal cavity size was normal in size. There is moderate concentric left ventricular hypertrophy.  Right Ventricle: The right ventricular size is normal. No increase in right ventricular wall thickness. Right ventricular systolic function is normal.  Left Atrium: Prior to procedure, patent left atrial appendage. Maximal 2D ostial diameter 19 mm. Maximal 3D Ostial diameter 21 mm. A transeptal puncture was performed inferior and posterior to the patient's PFO. A 27 mm Watchman FLX was placed. There is a 1/3 mitral shoulder. No peridevice leak. Average compression ~  28%. Successful Watchman placement. Left atrial size was mildly dilated. No left atrial/left atrial appendage thrombus was detected.  Right Atrium: Right atrial size was mildly dilated. Prominent Chiari network.  Pericardium: There is no evidence of pericardial effusion.  Mitral Valve: There is prolapse of the medial portion of the A2 segment. At presenting blood pressure there is late systolic flow reversal of the left upper pulmonary vein spectal Doppler. Mitral regurgitation appears moderate during hyoptension. Less than 1 cm posterior leaflet length and valve calcification would make procedural intervention with mTEER technically challenging. 3D MVA 4.6 cm2. The mitral valve is degenerative in appearance. There is severe holosystolic prolapse of of the mitral valve. Moderate to  severe mitral valve regurgitation. No evidence of mitral valve stenosis. The mean mitral valve gradient is 2.0 mmHg with average heart rate of 58 bpm.  Tricuspid Valve: The tricuspid valve is normal in structure. Tricuspid valve regurgitation is mild . No evidence of tricuspid stenosis.  Aortic Valve: The aortic valve is tricuspid. Aortic valve regurgitation is not visualized. Aortic valve sclerosis/calcification is present, without any evidence of aortic stenosis.  Pulmonic Valve: The pulmonic valve was normal in structure. Pulmonic valve regurgitation is not visualized. No evidence of pulmonic stenosis.  Aorta: The aortic root and ascending aorta are structurally normal, with no evidence of dilitation. There is moderate (Grade III) plaque involving the aortic arch.  Pulmonary Artery: The pulmonary artery is of normal size.  IAS/Shunts: The interatrial septum appears to be lipomatous. Evidence of atrial level shunting detected by color flow Doppler.  Additional Comments: Spectral Doppler performed.  LEFT VENTRICLE PLAX 2D LVOT diam:     2.00 cm LVOT Area:     3.14 cm   MITRAL VALVE MV Mean grad: 2.0 mmHg    SHUNTS MR Peak grad: 70.9 mmHg   Systemic Diam: 2.00 cm MR Mean grad: 40.0 mmHg MR Vmax:      421.00 cm/s MR Vmean:     293.0 cm/s  Riley Lam MD Electronically signed by Riley Lam MD Signature Date/Time: 07/15/2023/1:40:57 PM    Final             EKG:  EKG is not ordered today.    Recent Labs: 07/15/2023: ALT 22; BUN 20; Creatinine, Ser 1.75; Hemoglobin 13.2; Platelets 125; Potassium 4.1; Sodium 139  Recent Lipid Panel    Component Value Date/Time   CHOL 118 05/18/2016 0442   TRIG 143 05/18/2016 0442   HDL 31 (L) 05/18/2016 0442   CHOLHDL 3.8 05/18/2016 0442   VLDL 29 05/18/2016 0442   LDLCALC 58 05/18/2016 0442   Risk Assessment/Calculations:    HAS-BLED score 3 Hypertension Yes  Abnormal renal and liver function (Dialysis,  transplant, Cr >2.26 mg/dL /Cirrhosis or Bilirubin >2x Normal or AST/ALT/AP >3x Normal) No  Stroke No  Bleeding Yes  Labile INR (Unstable/high INR) No  Elderly (>65) Yes  Drugs or alcohol (>= 8 drinks/week, anti-plt or NSAID) No    CHA2DS2-VASc Score = 5  The patient's score is based upon: CHF History: 0 HTN History: 1 Diabetes History: 1 Stroke History: 0 Vascular Disease History: 1 Age Score: 2 Gender Score: 0  Physical Exam:    VS:  BP 124/68   Pulse (!) 55   Ht 5\' 10"  (1.778 m)   Wt 164 lb 3.2 oz (74.5 kg)   SpO2 98%   BMI 23.56 kg/m     Wt Readings from Last 3 Encounters:  08/23/23 164 lb 3.2 oz (74.5 kg)  07/15/23 164 lb 6.4 oz (74.6 kg)  06/30/23 160 lb 3.2 oz (72.7 kg)    General: Well developed, well nourished, NAD Lungs:Clear to ausculation bilaterally. No wheezes, rales, or rhonchi. Breathing is unlabored. Cardiovascular: RRR with S1 S2. No murmurs Extremities: No edema.  Neuro: Alert and oriented. No focal deficits. No facial asymmetry. MAE spontaneously. Psych: Responds to questions appropriately with normal affect.    ASSESSMENT/PLAN:    Paroxsymal atrial fibrillation: s/p LAAO with Watchman 27mm device on 07/15/23. He was restarted on Eliquis 5mg  for 45 days then stop (12/8). On 12/9 he will start Plavix 75mg  to complete 6 months of post implant therapy (01/13/2024). No need for dental SBE as he wears full upper and lower dentures. Pre TEE instructions reviewed with understanding. Obtain CBC, BMET today. Plan 6 month follow up with our team 12/2023.   [facility] has been requested to perform a transesophageal echocardiogram on Nicolas Berry for post [facility] device evaluation.  After careful review of history and examination, the risks and benefits of transesophageal echocardiogram have been explained including risks of esophageal damage, perforation (1:10,000 risk), bleeding, pharyngeal hematoma as well as other potential complications associated  with conscious sedation including aspiration, arrhythmia, respiratory failure and death. Alternatives to treatment were discussed, questions were answered. Patient is willing to proceed.   HTN: WNL today with no changes needed at this time.   GI bleeding: No recurrent bleeding since procedure.   I spent 20 minutes caring for this patient today including face-to-face discussions, ordering and reviewing labs, reviewing records from [facility] and other outside facilities, documenting in the record, and arranging for follow up.    Informed Consent   Shared Decision Making/Informed Consent   The risks [esophageal damage, perforation (1:10,000 risk), bleeding, pharyngeal hematoma as well as other potential complications associated with conscious sedation including aspiration, arrhythmia, respiratory failure and death], benefits (treatment guidance and diagnostic support) and alternatives of a transesophageal echocardiogram were discussed in detail with Mr. Wee and he is willing to proceed.       Medication Adjustments/Labs and Tests Ordered: Current medicines are reviewed at length with the patient today.  Concerns regarding medicines are outlined above.  Orders Placed This Encounter  Procedures   Basic metabolic panel   CBC   Meds ordered this encounter  Medications   clopidogrel (PLAVIX) 75 MG tablet    Sig: Take 1 tablet (75 mg total) by mouth daily. START 08/30/23    Dispense:  90 tablet    Refill:  3    Pt will continue Eliquis through 08/29/23 and start Plavix 08/30/23    Patient Instructions  Medication Instructions:  Your physician has recommended you make the following change in your medication:   Continue Eliquis through 08/29/23. START Plavix 75 mg once daily beginning 08/30/23.   *If you need a refill on your cardiac medications before your next appointment, please call your pharmacy*  Lab Work: TODAY BMP and CBC If you have labs (blood work) drawn today and your  tests are completely normal, you will receive your results only by: MyChart Message (if you have MyChart) OR A paper copy in the mail If you have any lab test that is abnormal or we need to change your treatment, we will call you to review the results.  Testing/Procedures: None ordered today.  Follow-Up: At [facility], you and your health needs are our priority.  As part of our continuing mission to provide you with exceptional heart care, we  have created designated Provider Care Teams.  These Care Teams include your primary Cardiologist (physician) and Advanced Practice Providers (APPs -  Physician Assistants and Nurse Practitioners) who all work together to provide you with the care you need, when you need it.  We recommend signing up for the patient portal called "MyChart".  Sign up information is provided on this After Visit Summary.  MyChart is used to connect with patients for Virtual Visits (Telemedicine).  Patients are able to view lab/test results, encounter notes, upcoming appointments, etc.  Non-urgent messages can be sent to your provider as well.   To learn more about what you can do with MyChart, go to [email or web address].    Your next appointment:   4 month(s) (April 2025)  The format for your next appointment:   In Person    Signed, Georgie Chard, NP  08/23/2023 9:58 AM    [facility]

## 2023-08-23 NOTE — Patient Instructions (Signed)
Medication Instructions:  Your physician has recommended you make the following change in your medication:   Continue Eliquis through 08/29/23. START Plavix 75 mg once daily beginning 08/30/23.   *If you need a refill on your cardiac medications before your next appointment, please call your pharmacy*  Lab Work: TODAY BMP and CBC If you have labs (blood work) drawn today and your tests are completely normal, you will receive your results only by: MyChart Message (if you have MyChart) OR A paper copy in the mail If you have any lab test that is abnormal or we need to change your treatment, we will call you to review the results.  Testing/Procedures: None ordered today.  Follow-Up: At [facility], you and your health needs are our priority.  As part of our continuing mission to provide you with exceptional heart care, we have created designated Provider Care Teams.  These Care Teams include your primary Cardiologist (physician) and Advanced Practice Providers (APPs -  Physician Assistants and Nurse Practitioners) who all work together to provide you with the care you need, when you need it.  We recommend signing up for the patient portal called "MyChart".  Sign up information is provided on this After Visit Summary.  MyChart is used to connect with patients for Virtual Visits (Telemedicine).  Patients are able to view lab/test results, encounter notes, upcoming appointments, etc.  Non-urgent messages can be sent to your provider as well.   To learn more about what you can do with MyChart, go to [email or web address].    Your next appointment:   4 month(s) (April 2025)  The format for your next appointment:   In Person

## 2023-08-23 NOTE — Progress Notes (Unsigned)
[facility]                                     Cardiology Office Note:    Date:  08/23/2023   ID:  Nicolas Berry, DOB 01/15/1946, MRN [id number]  PCP:  Doreatha Massed, MD  [facility] Cardiologist:  Dina Rich, MD  [facility] Electrophysiologist:  Lanier Prude, MD   Referring MD: Doreatha Massed, MD   Chief Complaint  Patient presents with   1 month s/p LAAO    History of Present Illness:    Nicolas Berry is a 77 y.o. male with a hx of CAD, COPD, HTN, HLD, DM2, CKD Stage IIIb, and paroxsymal atrial fibrillation with hx of GI bleeding and anemia while on anticoagulation who is now s/p LAAO with Watchman and is here for 1 month follow up.    [name] is followed by Dr. Wyline Mood for his cardiology care. [facility] was [facility] followed by [facility] and controlled with carvedilol and Eliquis for stroke prevention. Unfortunately he has suffered several GI bleeds requiring hospital admission and transfusion. GI workup with chronic gastritis positive for intestinal metaplasia and Schatzki's ring, mild non erosive antral gastropathy (negative for HP), normal visualized duodenum, but no blood or bleeding lesions seen throughout. Follow up colonoscopy 12/23 revealed few non-advanced adenomas, old melena in the colon, suspected of having a small bowel bleed vs. right sided diverticular bleed.   He was then referred to Dr. Lalla Brothers for consideration of LAAO with Watchman and felt to be a good candidate and is now s/p LAAO with Watchman 27mm FLX Pro device placed 07/15/23. He was restarted on Eliquis with plans to continue for 45 days (12/8) at which time he will stop Eliquis and start Plavix 75mg  to complete 6 months of therapy (01/13/24). He does not require dental SBE as he has full upper and lower dentures. A repeat TEE will be performed in approximately 60 days to ensure proper seal of the device.    He is here today alone and reports that he has been  very well with no complaints. He denies chest pain, SOB, palpitations, LE edema, orthopnea, PND, dizziness, or syncope. Denies bleeding in stool or urine.   Past Medical History:  Diagnosis Date   Anemia    Anxiety    OA multiple joints   Arthritis    Atrial fibrillation (HCC)    BPH (benign prostatic hyperplasia)    COPD (chronic obstructive pulmonary disease) (HCC)    uses inhaler   Coronary artery disease    DDD (degenerative disc disease), lumbar    Diabetes mellitus without complication (HCC)    Dyspnea    Fatty liver    GERD (gastroesophageal reflux disease)    Headache    Hyperlipidemia    Hypertension    Presence of Watchman left atrial appendage closure device 07/15/2023   27mm Watchman FLX Pro placed by Dr. Lalla Brothers    Past Surgical History:  Procedure Laterality Date   BACK SURGERY     C6-C7   BALLOON DILATION N/A 10/01/2020   Procedure: Marvis Repress DILATION;  Surgeon: Lanelle Bal, DO;  [facility];  Service: Endoscopy;  Laterality: N/A;   BIOPSY  10/01/2020   Procedure: BIOPSY;  Surgeon: Lanelle Bal, DO;  [facility];  Service: Endoscopy;;  gastric   COLONOSCOPY WITH PROPOFOL N/A 10/01/2020   Procedure:  COLONOSCOPY WITH PROPOFOL;  Surgeon: Lanelle Bal, DO;  [facility];  Service: Endoscopy;  Laterality: N/A;  2:15pm, pt know new time per office   ESOPHAGOGASTRODUODENOSCOPY (EGD) WITH PROPOFOL N/A 10/01/2020   Procedure: ESOPHAGOGASTRODUODENOSCOPY (EGD) WITH PROPOFOL;  Surgeon: Lanelle Bal, DO;  [facility];  Service: Endoscopy;  Laterality: N/A;   HERNIA REPAIR     left inguinal   LEFT ATRIAL APPENDAGE OCCLUSION N/A 07/15/2023   Procedure: LEFT ATRIAL APPENDAGE OCCLUSION;  Surgeon: Lanier Prude, MD;  [facility]: [facility];  Service: Cardiovascular;  Laterality: N/A;   POLYPECTOMY  10/01/2020   Procedure: POLYPECTOMY INTESTINAL;  Surgeon: Lanelle Bal, DO;  [facility];   Service: Endoscopy;;   STRABISMUS SURGERY Left 10/25/2020   Procedure: STRABISMUS REPAIR LEFT EYE;  Surgeon: Verne Carrow, MD;  Location: [facility];  Service: Ophthalmology;  Laterality: Left;   TEE WITHOUT CARDIOVERSION N/A 07/15/2023   Procedure: TRANSESOPHAGEAL ECHOCARDIOGRAM;  Surgeon: Lanier Prude, MD;  Location: [facility] INVASIVE CV LAB;  Service: Cardiovascular;  Laterality: N/A;   TONSILLECTOMY     TOTAL KNEE ARTHROPLASTY Right 06/12/2022   Procedure: RIGHT TOTAL KNEE ARTHROPLASTY;  Surgeon: Kathryne Hitch, MD;  Location: [facility];  Service: Orthopedics;  Laterality: Right;    Current Medications: Current Meds  Medication Sig   albuterol (VENTOLIN HFA) 108 (90 Base) MCG/ACT inhaler Inhale 2 puffs into the lungs every 4 (four) hours as needed for wheezing or shortness of breath.   apixaban (ELIQUIS) 5 MG TABS tablet Take 1 tablet (5 mg total) by mouth 2 (two) times daily.   carvedilol (COREG) 3.125 MG tablet TAKE 1 TABLET BY MOUTH TWICE DAILY STOPPING  CARDIZEM   Cholecalciferol 25 MCG (1000 UT) tablet Take 1,000 Units by mouth daily.   clopidogrel (PLAVIX) 75 MG tablet Take 1 tablet (75 mg total) by mouth daily. START 08/30/23   DULoxetine (CYMBALTA) 60 MG capsule Take 60 mg by mouth every evening.   famotidine (PEPCID) 20 MG tablet Take 20 mg by mouth daily.   finasteride (PROSCAR) 5 MG tablet Take 1 tablet (5 mg total) by mouth daily.   gabapentin (NEURONTIN) 300 MG capsule Take 600 mg by mouth 2 (two) times daily.   losartan (COZAAR) 25 MG tablet Take 0.5 tablets (12.5 mg total) by mouth daily.   meclizine (ANTIVERT) 25 MG tablet Take 1 tablet (25 mg total) by mouth 3 (three) times daily as needed for dizziness.   metFORMIN (GLUCOPHAGE) 500 MG tablet Take 500 mg by mouth 2 (two) times daily with a meal.   mirabegron ER (MYRBETRIQ) 25 MG TB24 tablet Take 1 tablet (25 mg total) by mouth daily.   rosuvastatin (CRESTOR) 40 MG tablet Take 40 mg by mouth at  bedtime.   traZODone (DESYREL) 100 MG tablet Take 150 mg by mouth at bedtime.     Allergies:   Lorazepam   Social History   Socioeconomic History   Marital status: Married    Spouse name: Not on file   Number of children: 3   Years of education: Not on file   Highest education level: Not on file  Occupational History   Occupation: retired  Tobacco Use   Smoking status: Every Day    Current packs/day: 1.50    Average packs/day: 1.5 packs/day for 63.0 years (94.6 ttl pk-yrs)    Types: Cigarettes    Start date: 08/06/1960   Smokeless tobacco: Never  Vaping Use  Vaping status: Former  Substance and Sexual Activity   Alcohol use: Yes    Comment: rarely   Drug use: Never   Sexual activity: Not on file  Other Topics Concern   Not on file  Social History Narrative   Not on file   Social Determinants of Health   Financial Resource Strain: Low Risk  (10/15/2022)   Received from [facility], [facility]   Overall Financial Resource Strain (CARDIA)    Difficulty of Paying Living Expenses: Not very hard  Food Insecurity: No Food Insecurity (07/15/2023)   Hunger Vital Sign    Worried About Running Out of Food in the Last Year: Never true    Ran Out of Food in the Last Year: Never true  Transportation Needs: No Transportation Needs (07/15/2023)   PRAPARE - Administrator, Civil Service (Medical): No    Lack of Transportation (Non-Medical): No  Physical Activity: Not on file  Stress: Stress Concern Present (09/11/2022)   Received from [facility], [facility] of Occupational Health - Occupational Stress Questionnaire    Feeling of Stress : Rather much  Social Connections: Unknown (09/10/2022)   Received from [facility], [facility]   Social Network    Social Network: Not on file    Family History: The patient's family history includes Cancer in his mother and sister; Diabetes in his father. There is no history of Colon  cancer.  ROS:   Please see the history of present illness.    All other systems reviewed and are negative.  EKGs/Labs/Other Studies Reviewed:    The following studies were reviewed today:  Cardiac Studies & Procedures     STRESS TESTS  NM MYOCAR MULTI W/SPECT W 05/19/2016  Narrative  There was no ST segment deviation noted during stress.  No T wave inversion was noted during stress.  The study is normal.  This is a low risk study.  The left ventricular ejection fraction is hyperdynamic (>65%).   ECHOCARDIOGRAM  ECHOCARDIOGRAM COMPLETE 04/21/2023  Narrative ECHOCARDIOGRAM REPORT    Patient Name:   DELYNN MARANA Date of Exam: 04/21/2023 Medical Rec #:  [id number]        Height:       70.0 in Accession #:    [id number]       Weight:       160.8 lb Date of Birth:  09-Aug-1946       BSA:          1.902 m Patient Age:    45 years         BP:           160/80 mmHg Patient Gender: M                HR:           61 bpm. Exam Location:  [place]  Procedure: 2D Echo, Cardiac Doppler and Color Doppler  Indications:    I48.0 Paroxysmal atrial fibrillation  History:        Patient has prior history of Echocardiogram examinations, most recent 08/13/2020. COPD, Arrythmias:Atrial Fibrillation; Risk Factors:Hypertension, Diabetes, Dyslipidemia and Current Smoker.  Sonographer:    Dominica Severin RCS, RVS Referring Phys: [id number] Lanier Prude   Sonographer Comments: Global longitudinal strain was attempted. IMPRESSIONS   1. Left ventricular ejection fraction, by estimation, is 60 to 65%. The left ventricle has normal function. The left ventricle has no regional wall motion abnormalities. Left ventricular diastolic parameters  are consistent with Grade I diastolic dysfunction (impaired relaxation). 2. Right ventricular systolic function is normal. The right ventricular size is normal. There is mildly elevated pulmonary artery systolic pressure. 3. The mitral valve is abnormal.  Trivial mitral valve regurgitation. No evidence of mitral stenosis. Severe mitral annular calcification. 4. The aortic valve is tricuspid. There is moderate calcification of the aortic valve. Aortic valve regurgitation is not visualized. Aortic valve sclerosis/calcification is present, without any evidence of aortic stenosis. Aortic valve mean gradient measures 10.0 mmHg. Aortic valve Vmax measures 2.02 m/s. 5. The inferior vena cava is normal in size with greater than 50% respiratory variability, suggesting right atrial pressure of 3 mmHg.  Comparison(s): No significant change from prior study.  FINDINGS Left Ventricle: Left ventricular ejection fraction, by estimation, is 60 to 65%. The left ventricle has normal function. The left ventricle has no regional wall motion abnormalities. The left ventricular internal cavity size was normal in size. There is no left ventricular hypertrophy. Left ventricular diastolic parameters are consistent with Grade I diastolic dysfunction (impaired relaxation).  Right Ventricle: The right ventricular size is normal. No increase in right ventricular wall thickness. Right ventricular systolic function is normal. There is mildly elevated pulmonary artery systolic pressure. The tricuspid regurgitant velocity is 2.93 m/s, and with an assumed right atrial pressure of 3 mmHg, the estimated right ventricular systolic pressure is 37.3 mmHg.  Left Atrium: Left atrial size was normal in size.  Right Atrium: Right atrial size was normal in size.  Pericardium: Trivial pericardial effusion is present.  Mitral Valve: The mitral valve is abnormal. Severe mitral annular calcification. Trivial mitral valve regurgitation. No evidence of mitral valve stenosis. MV peak gradient, 8.1 mmHg. The mean mitral valve gradient is 3.0 mmHg.  Tricuspid Valve: The tricuspid valve is normal in structure. Tricuspid valve regurgitation is mild . No evidence of tricuspid stenosis.  Aortic  Valve: The aortic valve is tricuspid. There is moderate calcification of the aortic valve. Aortic valve regurgitation is not visualized. Aortic valve sclerosis/calcification is present, without any evidence of aortic stenosis. Aortic valve mean gradient measures 10.0 mmHg. Aortic valve peak gradient measures 16.3 mmHg. Aortic valve area, by VTI measures 3.07 cm.  Pulmonic Valve: The pulmonic valve was grossly normal. Pulmonic valve regurgitation is not visualized. No evidence of pulmonic stenosis.  Aorta: The aortic root and ascending aorta are structurally normal, with no evidence of dilitation.  Venous: The inferior vena cava is normal in size with greater than 50% respiratory variability, suggesting right atrial pressure of 3 mmHg.  IAS/Shunts: No atrial level shunt detected by color flow Doppler.   LEFT VENTRICLE PLAX 2D LVIDd:         4.70 cm     Diastology LVIDs:         2.90 cm     LV e' medial:    3.70 cm/s LV PW:         1.30 cm     LV E/e' medial:  26.0 LV IVS:        1.30 cm     LV e' lateral:   6.09 cm/s LVOT diam:     2.20 cm     LV E/e' lateral: 15.8 LV SV:         123 LV SV Index:   65 LVOT Area:     3.80 cm  LV Volumes (MOD) LV vol d, MOD A2C: 63.3 ml LV vol d, MOD A4C: 74.6 ml LV vol s, MOD A2C: 22.1  ml LV vol s, MOD A4C: 27.3 ml LV SV MOD A2C:     41.2 ml LV SV MOD A4C:     74.6 ml LV SV MOD BP:      46.4 ml  RIGHT VENTRICLE RV Basal diam:  3.00 cm RV Mid diam:    3.70 cm RV S prime:     20.90 cm/s TAPSE (M-mode): 2.0 cm  LEFT ATRIUM             Index        RIGHT ATRIUM           Index LA diam:        3.70 cm 1.94 cm/m   RA Area:     12.80 cm LA Vol (A2C):   47.9 ml 25.18 ml/m  RA Volume:   29.10 ml  15.30 ml/m LA Vol (A4C):   51.3 ml 26.96 ml/m LA Biplane Vol: 52.0 ml 27.33 ml/m AORTIC VALVE                     PULMONIC VALVE AV Area (Vmax):    3.33 cm      PV Vmax:       1.17 m/s AV Area (Vmean):   3.50 cm      PV Peak grad:  5.5 mmHg AV  Area (VTI):     3.07 cm AV Vmax:           202.00 cm/s AV Vmean:          134.500 cm/s AV VTI:            0.400 m AV Peak Grad:      16.3 mmHg AV Mean Grad:      10.0 mmHg LVOT Vmax:         177.00 cm/s LVOT Vmean:        124.000 cm/s LVOT VTI:          0.323 m LVOT/AV VTI ratio: 0.81  AORTA Ao Root diam: 3.50 cm Ao Asc diam:  3.50 cm  MITRAL VALVE                TRICUSPID VALVE MV Area (PHT): 2.17 cm     TR Peak grad:   34.3 mmHg MV Area VTI:   2.41 cm     TR Vmax:        293.00 cm/s MV Peak grad:  8.1 mmHg MV Mean grad:  3.0 mmHg     SHUNTS MV Vmax:       1.42 m/s     Systemic VTI:  0.32 m MV Vmean:      88.0 cm/s    Systemic Diam: 2.20 cm MV Decel Time: 350 msec MV E velocity: 96.10 cm/s MV A velocity: 146.00 cm/s MV E/A ratio:  0.66  Vishnu Priya Mallipeddi Electronically signed by Winfield Rast Mallipeddi Signature Date/Time: 04/22/2023/8:18:26 PM    Final   TEE  ECHO TEE 07/15/2023  Narrative TRANSESOPHOGEAL ECHO REPORT    Patient Name:   JAKARRI HOLZ Date of Exam: 07/15/2023 Medical Rec #:  [id number]        Height:       70.0 in Accession #:    [id number]       Weight:       157.0 lb Date of Birth:  09-10-1946       BSA:          1.883 m Patient Age:    77 years  BP:           151/72 mmHg Patient Gender: M                HR:           58 bpm. Exam Location:  Inpatient  Procedure: Transesophageal Echo, 3D Echo, Color Doppler and Cardiac Doppler  Indications:     I48.1 Persistent atrial fibrillation  History:         Patient has prior history of Echocardiogram examinations, most recent 04/22/2023. COPD, Arrythmias:Atrial Fibrillation; Risk Factors:Hypertension, Dyslipidemia and Diabetes.  Sonographer:     Irving Burton Senior RDCS Referring Phys:  [id number] Lanier Prude Diagnosing Phys: Riley Lam MD  PROCEDURE: After discussion of the risks and benefits of a TEE, an informed consent was obtained from the patient. The transesophogeal  probe was passed without difficulty through the esophogus of the patient. Sedation performed by different physician. The patient was monitored while under deep sedation. The patient developed no complications during the procedure.  IMPRESSIONS   1. Prior to procedure, patent left atrial appendage. Maximal 2D ostial diameter 19 mm. Maximal 3D Ostial diameter 21 mm. A transeptal puncture was performed inferior and posterior to the patient's PFO. A 27 mm Watchman FLX was placed. There is a 1/3 mitral shoulder. No peridevice leak. Average compression ~ 28%. Successful Watchman placement. Left atrial size was mildly dilated. No left atrial/left atrial appendage thrombus was detected. 2. Chordal systolic anterior motion of the mitral valve. There is no significant LVOT obstruction during anesthesia procedure, there is LVOT flow accleration. There is papillary muscle hypertrophy. Left ventricular ejection fraction, by estimation, is 60 to 65%. The left ventricle has normal function. There is moderate concentric left ventricular hypertrophy. 3. There is prolapse of the medial portion of the A2 segment. At presenting blood pressure there is late systolic flow reversal of the left upper pulmonary vein spectal Doppler. Mitral regurgitation appears moderate during hyoptension. Less than 1 cm posterior leaflet length and valve calcification would make procedural intervention with mTEER technically challenging. 3D MVA 4.6 cm2. The mitral valve is degenerative. Moderate to severe mitral valve regurgitation. No evidence of mitral stenosis. There is severe holosystolic prolapse of of the mitral valve. The mean mitral valve gradient is 2.0 mmHg with average heart rate of 58 bpm. 4. Right ventricular systolic function is normal. The right ventricular size is normal. 5. Right atrial size was mildly dilated. 6. The aortic valve is tricuspid. Aortic valve regurgitation is not visualized. Aortic valve  sclerosis/calcification is present, without any evidence of aortic stenosis. 7. There is Moderate (Grade III) plaque involving the aortic arch. 8. Evidence of atrial level shunting detected by color flow Doppler.  FINDINGS Left Ventricle: Chordal systolic anterior motion of the mitral valve. There is no significant LVOT obstruction during anesthesia procedure, there is LVOT flow accleration. There is papillary muscle hypertrophy. Left ventricular ejection fraction, by estimation, is 60 to 65%. The left ventricle has normal function. The left ventricular internal cavity size was normal in size. There is moderate concentric left ventricular hypertrophy.  Right Ventricle: The right ventricular size is normal. No increase in right ventricular wall thickness. Right ventricular systolic function is normal.  Left Atrium: Prior to procedure, patent left atrial appendage. Maximal 2D ostial diameter 19 mm. Maximal 3D Ostial diameter 21 mm. A transeptal puncture was performed inferior and posterior to the patient's PFO. A 27 mm Watchman FLX was placed. There is a 1/3 mitral shoulder. No peridevice leak. Average compression ~  28%. Successful Watchman placement. Left atrial size was mildly dilated. No left atrial/left atrial appendage thrombus was detected.  Right Atrium: Right atrial size was mildly dilated. Prominent Chiari network.  Pericardium: There is no evidence of pericardial effusion.  Mitral Valve: There is prolapse of the medial portion of the A2 segment. At presenting blood pressure there is late systolic flow reversal of the left upper pulmonary vein spectal Doppler. Mitral regurgitation appears moderate during hyoptension. Less than 1 cm posterior leaflet length and valve calcification would make procedural intervention with mTEER technically challenging. 3D MVA 4.6 cm2. The mitral valve is degenerative in appearance. There is severe holosystolic prolapse of of the mitral valve. Moderate to  severe mitral valve regurgitation. No evidence of mitral valve stenosis. The mean mitral valve gradient is 2.0 mmHg with average heart rate of 58 bpm.  Tricuspid Valve: The tricuspid valve is normal in structure. Tricuspid valve regurgitation is mild . No evidence of tricuspid stenosis.  Aortic Valve: The aortic valve is tricuspid. Aortic valve regurgitation is not visualized. Aortic valve sclerosis/calcification is present, without any evidence of aortic stenosis.  Pulmonic Valve: The pulmonic valve was normal in structure. Pulmonic valve regurgitation is not visualized. No evidence of pulmonic stenosis.  Aorta: The aortic root and ascending aorta are structurally normal, with no evidence of dilitation. There is moderate (Grade III) plaque involving the aortic arch.  Pulmonary Artery: The pulmonary artery is of normal size.  IAS/Shunts: The interatrial septum appears to be lipomatous. Evidence of atrial level shunting detected by color flow Doppler.  Additional Comments: Spectral Doppler performed.  LEFT VENTRICLE PLAX 2D LVOT diam:     2.00 cm LVOT Area:     3.14 cm   MITRAL VALVE MV Mean grad: 2.0 mmHg    SHUNTS MR Peak grad: 70.9 mmHg   Systemic Diam: 2.00 cm MR Mean grad: 40.0 mmHg MR Vmax:      421.00 cm/s MR Vmean:     293.0 cm/s  Riley Lam MD Electronically signed by Riley Lam MD Signature Date/Time: 07/15/2023/1:40:57 PM    Final             EKG:  EKG is not ordered today.    Recent Labs: 07/15/2023: ALT 22; BUN 20; Creatinine, Ser 1.75; Hemoglobin 13.2; Platelets 125; Potassium 4.1; Sodium 139  Recent Lipid Panel    Component Value Date/Time   CHOL 118 05/18/2016 0442   TRIG 143 05/18/2016 0442   HDL 31 (L) 05/18/2016 0442   CHOLHDL 3.8 05/18/2016 0442   VLDL 29 05/18/2016 0442   LDLCALC 58 05/18/2016 0442   Risk Assessment/Calculations:    HAS-BLED score 3 Hypertension Yes  Abnormal renal and liver function (Dialysis,  transplant, Cr >2.26 mg/dL /Cirrhosis or Bilirubin >2x Normal or AST/ALT/AP >3x Normal) No  Stroke No  Bleeding Yes  Labile INR (Unstable/high INR) No  Elderly (>65) Yes  Drugs or alcohol (>= 8 drinks/week, anti-plt or NSAID) No    CHA2DS2-VASc Score = 5  The patient's score is based upon: CHF History: 0 HTN History: 1 Diabetes History: 1 Stroke History: 0 Vascular Disease History: 1 Age Score: 2 Gender Score: 0  Physical Exam:    VS:  BP 124/68   Pulse (!) 55   Ht 5\' 10"  (1.778 m)   Wt 164 lb 3.2 oz (74.5 kg)   SpO2 98%   BMI 23.56 kg/m     Wt Readings from Last 3 Encounters:  08/23/23 164 lb 3.2 oz (74.5 kg)  07/15/23 164 lb 6.4 oz (74.6 kg)  06/30/23 160 lb 3.2 oz (72.7 kg)    General: Well developed, well nourished, NAD Lungs:Clear to ausculation bilaterally. No wheezes, rales, or rhonchi. Breathing is unlabored. Cardiovascular: RRR with S1 S2. No murmurs Extremities: No edema.  Neuro: Alert and oriented. No focal deficits. No facial asymmetry. MAE spontaneously. Psych: Responds to questions appropriately with normal affect.    ASSESSMENT/PLAN:    Paroxsymal atrial fibrillation: s/p LAAO with Watchman 27mm device on 07/15/23. He was restarted on Eliquis 5mg  for 45 days then stop (12/8). On 12/9 he will start Plavix 75mg  to complete 6 months of post implant therapy (01/13/2024). No need for dental SBE as he wears full upper and lower dentures. Pre TEE instructions reviewed with understanding. Obtain CBC, BMET today. Plan 6 month follow up with our team 12/2023.   [facility] has been requested to perform a transesophageal echocardiogram on Nicolas Berry for [facility].  After careful review of history and examination, the risks and benefits of transesophageal echocardiogram have been explained including risks of esophageal damage, perforation (1:10,000 risk), bleeding, pharyngeal hematoma as well as other potential complications associated  with conscious sedation including aspiration, arrhythmia, respiratory failure and death. Alternatives to treatment were discussed, questions were answered. Patient is willing to proceed.   HTN: WNL today with no changes needed at this time.   GI bleeding: No recurrent bleeding since procedure.   I spent 20 minutes caring for this patient today including face-to-face discussions, ordering and reviewing labs, reviewing records from [facility] and other outside facilities, documenting in the record, and arranging for follow up.    Informed Consent   Shared Decision Making/Informed Consent   The risks [esophageal damage, perforation (1:10,000 risk), bleeding, pharyngeal hematoma as well as other potential complications associated with conscious sedation including aspiration, arrhythmia, respiratory failure and death], benefits (treatment guidance and diagnostic support) and alternatives of a transesophageal echocardiogram were discussed in detail with Mr. Kissling and he is willing to proceed.       Medication Adjustments/Labs and Tests Ordered: Current medicines are reviewed at length with the patient today.  Concerns regarding medicines are outlined above.  Orders Placed This Encounter  Procedures   Basic metabolic panel   CBC   Meds ordered this encounter  Medications   clopidogrel (PLAVIX) 75 MG tablet    Sig: Take 1 tablet (75 mg total) by mouth daily. START 08/30/23    Dispense:  90 tablet    Refill:  3    Pt will continue Eliquis through 08/29/23 and start Plavix 08/30/23    Patient Instructions  Medication Instructions:  Your physician has recommended you make the following change in your medication:   Continue Eliquis through 08/29/23. START Plavix 75 mg once daily beginning 08/30/23.   *If you need a refill on your cardiac medications before your next appointment, please call your pharmacy*  Lab Work: TODAY BMP and CBC If you have labs (blood work) drawn today and your  tests are completely normal, you will receive your results only by: MyChart Message (if you have MyChart) OR A paper copy in the mail If you have any lab test that is abnormal or we need to change your treatment, we will call you to review the results.  Testing/Procedures: None ordered today.  Follow-Up: At [facility], you and your health needs are our priority.  As part of our continuing mission to provide you with exceptional heart care, we  have created designated Provider Care Teams.  These Care Teams include your primary Cardiologist (physician) and Advanced Practice Providers (APPs -  Physician Assistants and Nurse Practitioners) who all work together to provide you with the care you need, when you need it.  We recommend signing up for the patient portal called "MyChart".  Sign up information is provided on this After Visit Summary.  MyChart is used to connect with patients for Virtual Visits (Telemedicine).  Patients are able to view lab/test results, encounter notes, upcoming appointments, etc.  Non-urgent messages can be sent to your provider as well.   To learn more about what you can do with MyChart, go to [email or web address].    Your next appointment:   4 month(s) (April 2025)  The format for your next appointment:   In Person    Signed, Georgie Chard, NP  08/23/2023 9:58 AM    [facility]

## 2023-09-02 ENCOUNTER — Other Ambulatory Visit: Payer: Self-pay | Admitting: Cardiology

## 2023-09-02 ENCOUNTER — Telehealth: Payer: Self-pay

## 2023-09-02 DIAGNOSIS — I480 Paroxysmal atrial fibrillation: Secondary | ICD-10-CM | POA: Diagnosis not present

## 2023-09-02 NOTE — Telephone Encounter (Signed)
[facility] called and said that they aren't getting any lab orders for [place] and they needed to be faxed. I printed lab requisitions for BMET and CBC and faxed to [phone]    KT

## 2023-09-02 NOTE — Telephone Encounter (Signed)
The patient called to ask about getting labs drawn. He understands he can get his labs drawn at any [facility] (it looks like Noreene Larsson ordered BMET and CBC).  While on the phone, confirmed with the patient he STOPPED ELIQUIS and STARTED PLAVIX as directed 12/9. Med list updated.

## 2023-09-03 LAB — BASIC METABOLIC PANEL
BUN/Creatinine Ratio: 9 — ABNORMAL LOW (ref 10–24)
BUN: 17 mg/dL (ref 8–27)
CO2: 24 mmol/L (ref 20–29)
Calcium: 10.3 mg/dL — ABNORMAL HIGH (ref 8.6–10.2)
Chloride: 103 mmol/L (ref 96–106)
Creatinine, Ser: 1.81 mg/dL — ABNORMAL HIGH (ref 0.76–1.27)
Glucose: 113 mg/dL — ABNORMAL HIGH (ref 70–99)
Potassium: 4.3 mmol/L (ref 3.5–5.2)
Sodium: 142 mmol/L (ref 134–144)
eGFR: 38 mL/min/{1.73_m2} — ABNORMAL LOW (ref >59)

## 2023-09-03 LAB — CBC
Hematocrit: 43.3 % (ref 37.5–51.0)
Hemoglobin: 14 g/dL (ref 13.0–17.7)
MCH: 30.6 pg (ref 26.6–33.0)
MCHC: 32.3 g/dL (ref 31.5–35.7)
MCV: 95 fL (ref 79–97)
Platelets: 142 10*3/uL — ABNORMAL LOW (ref 150–450)
RBC: 4.58 x10E6/uL (ref 4.14–5.80)
RDW: 12.4 % (ref 11.6–15.4)
WBC: 7.9 10*3/uL (ref 3.4–10.8)

## 2023-09-07 NOTE — Progress Notes (Signed)
Unable to reach patient about procedure, but was able to leave a detailed message. Stated that the patient needed to arrive at the hospital at 1015 , remain NPO after 0000, needs to have a ride home and a responsible adult to stay with them for 24 hours after the procedure. Instructed the patient to call back if they had any questions.

## 2023-09-08 ENCOUNTER — Encounter (INDEPENDENT_AMBULATORY_CARE_PROVIDER_SITE_OTHER): Payer: Self-pay | Admitting: *Deleted

## 2023-09-08 ENCOUNTER — Ambulatory Visit (HOSPITAL_COMMUNITY): Payer: No Typology Code available for payment source | Admitting: Anesthesiology

## 2023-09-08 ENCOUNTER — Encounter (HOSPITAL_COMMUNITY): Payer: Self-pay | Admitting: Cardiovascular Disease

## 2023-09-08 ENCOUNTER — Encounter (HOSPITAL_COMMUNITY)
Admission: RE | Disposition: A | Discharge status: Home / Self Care | Payer: Self-pay | Source: Home / Self Care | Attending: Cardiovascular Disease

## 2023-09-08 ENCOUNTER — Ambulatory Visit (HOSPITAL_COMMUNITY)
Admission: RE | Admit: 2023-09-08 | Discharge: 2023-09-08 | Disposition: A | Discharge status: Home / Self Care | Payer: No Typology Code available for payment source | Attending: Cardiovascular Disease | Admitting: Cardiovascular Disease

## 2023-09-08 ENCOUNTER — Ambulatory Visit (HOSPITAL_BASED_OUTPATIENT_CLINIC_OR_DEPARTMENT_OTHER)
Admission: RE | Admit: 2023-09-08 | Discharge: 2023-09-08 | Disposition: A | Discharge status: Home / Self Care | Payer: No Typology Code available for payment source | Source: Ambulatory Visit | Attending: Cardiology | Admitting: Cardiology

## 2023-09-08 ENCOUNTER — Other Ambulatory Visit: Payer: Self-pay

## 2023-09-08 DIAGNOSIS — D631 Anemia in chronic kidney disease: Secondary | ICD-10-CM | POA: Diagnosis not present

## 2023-09-08 DIAGNOSIS — K219 Gastro-esophageal reflux disease without esophagitis: Secondary | ICD-10-CM | POA: Diagnosis not present

## 2023-09-08 DIAGNOSIS — Z79899 Other long term (current) drug therapy: Secondary | ICD-10-CM | POA: Insufficient documentation

## 2023-09-08 DIAGNOSIS — J449 Chronic obstructive pulmonary disease, unspecified: Secondary | ICD-10-CM | POA: Diagnosis not present

## 2023-09-08 DIAGNOSIS — Z7901 Long term (current) use of anticoagulants: Secondary | ICD-10-CM | POA: Insufficient documentation

## 2023-09-08 DIAGNOSIS — E1122 Type 2 diabetes mellitus with diabetic chronic kidney disease: Secondary | ICD-10-CM | POA: Diagnosis not present

## 2023-09-08 DIAGNOSIS — Z7902 Long term (current) use of antithrombotics/antiplatelets: Secondary | ICD-10-CM | POA: Diagnosis not present

## 2023-09-08 DIAGNOSIS — Z95818 Presence of other cardiac implants and grafts: Secondary | ICD-10-CM | POA: Diagnosis not present

## 2023-09-08 DIAGNOSIS — Z7984 Long term (current) use of oral hypoglycemic drugs: Secondary | ICD-10-CM | POA: Insufficient documentation

## 2023-09-08 DIAGNOSIS — N1832 Chronic kidney disease, stage 3b: Secondary | ICD-10-CM | POA: Insufficient documentation

## 2023-09-08 DIAGNOSIS — I080 Rheumatic disorders of both mitral and aortic valves: Secondary | ICD-10-CM | POA: Diagnosis not present

## 2023-09-08 DIAGNOSIS — I350 Nonrheumatic aortic (valve) stenosis: Secondary | ICD-10-CM | POA: Diagnosis not present

## 2023-09-08 DIAGNOSIS — I480 Paroxysmal atrial fibrillation: Secondary | ICD-10-CM | POA: Diagnosis not present

## 2023-09-08 DIAGNOSIS — I129 Hypertensive chronic kidney disease with stage 1 through stage 4 chronic kidney disease, or unspecified chronic kidney disease: Secondary | ICD-10-CM | POA: Insufficient documentation

## 2023-09-08 DIAGNOSIS — F17210 Nicotine dependence, cigarettes, uncomplicated: Secondary | ICD-10-CM | POA: Insufficient documentation

## 2023-09-08 DIAGNOSIS — I4819 Other persistent atrial fibrillation: Secondary | ICD-10-CM | POA: Insufficient documentation

## 2023-09-08 DIAGNOSIS — I4891 Unspecified atrial fibrillation: Secondary | ICD-10-CM

## 2023-09-08 DIAGNOSIS — E785 Hyperlipidemia, unspecified: Secondary | ICD-10-CM | POA: Diagnosis not present

## 2023-09-08 HISTORY — PX: TRANSESOPHAGEAL ECHOCARDIOGRAM (CATH LAB): EP1270

## 2023-09-08 LAB — ECHO TEE
AV Mean grad: 15 mm[Hg]
AV Peak grad: 24.8 mm[Hg]
Ao pk vel: 2.49 m/s

## 2023-09-08 LAB — GLUCOSE, CAPILLARY: Glucose-Capillary: 119 mg/dL — ABNORMAL HIGH (ref 70–99)

## 2023-09-08 SURGERY — TRANSESOPHAGEAL ECHOCARDIOGRAM (TEE) (CATHLAB)
Anesthesia: General

## 2023-09-08 MED ORDER — SODIUM CHLORIDE 0.9% FLUSH
10.0000 mL | Freq: Two times a day (BID) | INTRAVENOUS | Status: DC
  Administered 2023-09-08: 10 mL via INTRAVENOUS

## 2023-09-08 MED ORDER — PHENYLEPHRINE HCL-NACL 20-0.9 MG/250ML-% IV SOLN
INTRAVENOUS | Status: DC | PRN
  Administered 2023-09-08: 40 ug/min via INTRAVENOUS

## 2023-09-08 MED ORDER — PROPOFOL 10 MG/ML IV BOLUS
INTRAVENOUS | Status: DC | PRN
  Administered 2023-09-08: 80 mg via INTRAVENOUS

## 2023-09-08 MED ORDER — SODIUM CHLORIDE 0.9 % IV SOLN: INTRAVENOUS | Status: DC | PRN

## 2023-09-08 MED ORDER — LIDOCAINE 2% (20 MG/ML) 5 ML SYRINGE
INTRAMUSCULAR | Status: DC | PRN
  Administered 2023-09-08: 100 mg via INTRAVENOUS

## 2023-09-08 MED ORDER — EPHEDRINE SULFATE-NACL 50-0.9 MG/10ML-% IV SOSY
PREFILLED_SYRINGE | INTRAVENOUS | Status: DC | PRN
  Administered 2023-09-08: 5 mg via INTRAVENOUS

## 2023-09-08 MED ORDER — PROPOFOL 500 MG/50ML IV EMUL
INTRAVENOUS | Status: DC | PRN
  Administered 2023-09-08: 100 ug/kg/min via INTRAVENOUS

## 2023-09-08 NOTE — CV Procedure (Signed)
TEE: Post 27 mm Watchman FLX  Good device position with stable 1/3 mitral shoulder No leak by 2D/color flow Good compression See full report in syngo  SAM with LVOT gradient in 33m/s range Mild AS mean gradient in 7 mmHg range  Trivial MR Mild TR No residual PFO/ASD post trans septal puncture  See full report in Syngo  Charlton Haws MD [facility]

## 2023-09-08 NOTE — Interval H&P Note (Signed)
History and Physical Interval Note:  09/08/2023 10:49 AM  Nicolas Berry  has presented today for surgery, with the diagnosis of POST WATCHMAN EVALUATION.  The various methods of treatment have been discussed with the patient and family. After consideration of risks, benefits and other options for treatment, the patient has consented to  Procedure(s): TRANSESOPHAGEAL ECHOCARDIOGRAM (N/A) as a surgical intervention.  The patient's history has been reviewed, patient examined, no change in status, stable for surgery.  I have reviewed the patient's chart and labs.  Questions were answered to the patient's satisfaction.     Charlton Haws

## 2023-09-08 NOTE — Anesthesia Preprocedure Evaluation (Addendum)
Anesthesia Evaluation  Patient identified by MRN, date of birth, ID band Patient awake    Reviewed: Allergy & Precautions, NPO status , Patient's Chart, lab work & pertinent test results, reviewed documented beta blocker date and time   History of Anesthesia Complications Negative for: history of anesthetic complications  Airway Mallampati: II  TM Distance: >3 FB Neck ROM: Full    Dental  (+) Dental Advisory Given   Pulmonary COPD,  COPD inhaler, Current Smoker and Patient abstained from smoking.   breath sounds clear to auscultation       Cardiovascular hypertension, Pt. on medications and Pt. on home beta blockers (-) angina + dysrhythmias Atrial Fibrillation  Rhythm:Regular Rate:Normal  06/2023 echo:  1. Prior to procedure, patent left atrial appendage. Maximal 2D ostial diameter 19 mm. Maximal 3D Ostial diameter 21 mm.     A transeptal puncture was performed inferior and posterior to the patient's PFO.     A 27 mm Watchman FLX was placed. There is a 1/3 mitral shoulder. No peridevice leak. Average compression ~ 28%. Successful Watchman placement. Left atrial size was mildly dilated. No left atrial/left atrial appendage thrombus was detected.  2. Chordal systolic anterior motion of the mitral valve. There is no significant LVOT obstruction during anesthesia procedure, there is LVOT flow accleration. There is papillary muscle hypertrophy. Left ventricular ejection fraction, by estimation, is 60 to 65%. The left ventricle has normal function. There is moderate concentric left ventricular hypertrophy.  3. There is prolapse of the medial portion of the A2 segment. At presenting blood pressure there is late systolic flow reversal of the left upper pulmonary vein spectal Doppler. Mitral regurgitation appears moderate during hyoptension. Less than 1 cm posterior leaflet length and valve calcification would make procedural intervention with  mTEER technically challenging. 3D MVA 4.6 cm2. The mitral valve is degenerative. Moderate to severe mitral valve regurgitation. No evidence of mitral stenosis. There  is severe holosystolic prolapse of of the mitral valve. The mean mitral valve gradient is 2.0 mmHg with average heart rate of 58 bpm.  4. Right ventricular systolic function is normal. The right ventricular size is normal.  5. Right atrial size was mildly dilated.  6. The aortic valve is tricuspid. Aortic valve regurgitation is not visualized. Aortic valve sclerosis/calcification is present, without any evidence of aortic stenosis.    Neuro/Psych  Headaches    GI/Hepatic Neg liver ROS,GERD  Medicated and Controlled,,  Endo/Other  diabetes (glu 119), Oral Hypoglycemic Agents    Renal/GU Renal InsufficiencyRenal disease     Musculoskeletal   Abdominal   Peds  Hematology plaVIX   Anesthesia Other Findings   Reproductive/Obstetrics                              Anesthesia Physical Anesthesia Plan  ASA: 3  Anesthesia Plan: MAC   Post-op Pain Management: Minimal or no pain anticipated   Induction:   PONV Risk Score and Plan: 1 and Treatment may vary due to age or medical condition  Airway Management Planned: Natural Airway and Nasal Cannula  Additional Equipment: None  Intra-op Plan:   Post-operative Plan:   Informed Consent: I have reviewed the patients History and Physical, chart, labs and discussed the procedure including the risks, benefits and alternatives for the proposed anesthesia with the patient or authorized representative who has indicated his/her understanding and acceptance.     Dental advisory given  Plan Discussed with: CRNA and  Surgeon  Anesthesia Plan Comments:         Anesthesia Quick Evaluation

## 2023-09-08 NOTE — Anesthesia Postprocedure Evaluation (Signed)
Anesthesia Post Note  Patient: Nicolas Berry  Procedure(s) Performed: TRANSESOPHAGEAL ECHOCARDIOGRAM     Patient location during evaluation: Cath Lab Anesthesia Type: General Level of consciousness: awake and alert, patient cooperative and oriented Pain management: pain level controlled Vital Signs Assessment: post-procedure vital signs reviewed and stable Respiratory status: nonlabored ventilation, spontaneous breathing and respiratory function stable Cardiovascular status: blood pressure returned to baseline and stable Postop Assessment: no apparent nausea or vomiting Anesthetic complications: no   No notable events documented.  Last Vitals:  Vitals:   09/08/23 1125 09/08/23 1130  BP: 96/66 127/67  Pulse: 61 62  Resp: 14 13  Temp:    SpO2: 96% 96%    Last Pain:  Vitals:   09/08/23 1123  TempSrc: Temporal  PainSc: 0-No pain                 Aryssa Rosamond,E. Emelynn Rance

## 2023-09-08 NOTE — Transfer of Care (Signed)
Immediate Anesthesia Transfer of Care Note  Patient: Nicolas Berry  Procedure(s) Performed: TRANSESOPHAGEAL ECHOCARDIOGRAM  Patient Location: PACU and Cath Lab  Anesthesia Type:MAC  Level of Consciousness: awake and patient cooperative  Airway & Oxygen Therapy: Patient Spontanous Breathing  Post-op Assessment: Report given to RN and Post -op Vital signs reviewed and stable  Post vital signs: Reviewed and stable  Last Vitals:  Vitals Value Taken Time  BP    Temp    Pulse 60 09/08/23 1122  Resp 10 09/08/23 1122  SpO2 96 % 09/08/23 1122  Vitals shown include unfiled device data.  Last Pain:  Vitals:   09/08/23 0944  TempSrc: Temporal         Complications: No notable events documented.

## 2023-09-13 ENCOUNTER — Telehealth: Payer: Self-pay

## 2023-09-13 NOTE — Telephone Encounter (Signed)
Reviewed results with patient who verbalized understanding.   Confirmed with the patient he stopped Eliquis and started Plavix as directed. Confirmed 6 month visit in April 2024. He was grateful for call and agreed with plan.

## 2023-09-13 NOTE — Telephone Encounter (Signed)
-----   Message from Rossie Muskrat [facility] sent at 09/12/2023  7:36 PM EST ----- I dont think I saw it. OK to continue with planned post implant med regimen.  Thanks, Sheria Lang ----- Message ----- From: Henrietta Dine, RN Sent: 09/09/2023   2:53 PM EST To: Lanier Prude, MD  Hey! Not sure if this made it to your basket. Post LAAO TEE showed "Well placed 27 mm Watchman FLX device with stable 1/3 mitral shoulder no leak by color flow Average compression 15%."  Thank you!  KK

## 2023-09-16 ENCOUNTER — Other Ambulatory Visit (HOSPITAL_COMMUNITY): Payer: No Typology Code available for payment source

## 2023-09-23 ENCOUNTER — Encounter: Payer: Self-pay | Admitting: Hematology

## 2023-09-30 ENCOUNTER — Ambulatory Visit: Payer: No Typology Code available for payment source | Admitting: Nurse Practitioner

## 2023-09-30 NOTE — Progress Notes (Deleted)
  Cardiology Office Note:  .   Date:  09/30/2023  ID:  Nicolas Berry, DOB 03/09/1946, MRN [id number] PCP: Rogers Hai, MD  [facility] Providers Cardiologist:  [name], MD Electrophysiologist:  [name], MD { Click to update primary MD,subspecialty MD or APP then REFRESH:1}   History of Present Illness: Nicolas Berry is a 78 y.o. male with a PMH of PAF, CAD, HTN, HLD, valvular heart disease, dizziness, hx of several GI bleeds, esophageal thickening, COPD, CKD, and leg pains, who presents today for scheduled follow-up.  Followed by the A-fib clinic.  Last seen by Dr. Carrier Alvan on June 30, 2023. Was noted pt had a longstanding hx of dizziness, negative orthostatics.  It was felt that his dizziness was not due to cardiac etiology.  He had extensive workup performed by neuro. history of intermittent GI bleeding and was discussed there were plans for watchman implant later that month.  He remained on Eliquis  at that time and noted intermittent dark stools.  Underwent LAAO with watchman 27 mm FLX Pro device on July 15, 2023.  Was restarted on Eliquis  with plans to continue for 45 days until August 29, 2023, with plans to stop Eliquis  and start Plavix  75 mg daily to complete 6 months of therapy until January 13, 2024.  Closely followed by structural heart team.  Has been noted that he does not require dental SBE as he is full upper and lower dentures.  Underwent TEE postprocedure on September 08, 2023 that showed device was well-placed with no leak by color flow, average compression 15%.  See full report below.  Today he presents for follow-up.  He states...  ROS: ***  Studies Reviewed: .        *** Risk Assessment/Calculations:   {Does this patient have ATRIAL FIBRILLATION?:[phone]} No BP recorded.  {Refresh Note OR Click here to enter BP  :1}***       Physical Exam:   VS:  There were no vitals taken for this visit.   Wt Readings from  Last 3 Encounters:  09/08/23 157 lb (71.2 kg)  08/23/23 164 lb 3.2 oz (74.5 kg)  07/15/23 164 lb 6.4 oz (74.6 kg)    GEN: Well nourished, well developed in no acute distress NECK: No JVD; No carotid bruits CARDIAC: ***RRR, no murmurs, rubs, gallops RESPIRATORY:  Clear to auscultation without rales, wheezing or rhonchi  ABDOMEN: Soft, non-tender, non-distended EXTREMITIES:  No edema; No deformity   ASSESSMENT AND PLAN: .   ***    {Are you ordering a CV Procedure (e.g. stress test, cath, DCCV, TEE, etc)?   Press F2        :[id number]}  Dispo: ***  Signed, Almarie Crate, NP

## 2023-10-01 ENCOUNTER — Other Ambulatory Visit: Payer: Self-pay

## 2023-10-01 DIAGNOSIS — Z87891 Personal history of nicotine dependence: Secondary | ICD-10-CM

## 2023-10-01 DIAGNOSIS — Z122 Encounter for screening for malignant neoplasm of respiratory organs: Secondary | ICD-10-CM

## 2023-10-12 DIAGNOSIS — E1129 Type 2 diabetes mellitus with other diabetic kidney complication: Secondary | ICD-10-CM | POA: Diagnosis not present

## 2023-10-12 DIAGNOSIS — D631 Anemia in chronic kidney disease: Secondary | ICD-10-CM | POA: Diagnosis not present

## 2023-10-12 DIAGNOSIS — E211 Secondary hyperparathyroidism, not elsewhere classified: Secondary | ICD-10-CM | POA: Diagnosis not present

## 2023-10-12 DIAGNOSIS — N189 Chronic kidney disease, unspecified: Secondary | ICD-10-CM | POA: Diagnosis not present

## 2023-10-12 DIAGNOSIS — I5032 Chronic diastolic (congestive) heart failure: Secondary | ICD-10-CM | POA: Diagnosis not present

## 2023-10-12 DIAGNOSIS — R809 Proteinuria, unspecified: Secondary | ICD-10-CM | POA: Diagnosis not present

## 2023-10-25 ENCOUNTER — Encounter: Payer: Self-pay | Admitting: Hematology

## 2023-10-31 IMAGING — MR MR CERVICAL SPINE W/O CM
5 series · 34 of 48 positions shown · non-contrast
Comparison: None.

CLINICAL DATA: Dizziness R42 (51O-7F-CM); lightheadedness R42
(51O-7F-CM); weakness 1L9.9 (51O-7F-CM).

Weakness of lower extremity, unspecified laterality
(51O-7F-CM).
EXAM:
MRI CERVICAL AND LUMBAR SPINE WITHOUT CONTRAST
TECHNIQUE: Multiplanar and multiecho pulse sequences of the cervical spine, to
include the craniocervical junction and cervicothoracic junction,
and lumbar spine, were obtained without intravenous contrast.

[Series 16: T2 · sagittal · 3.0mm · 0.69mm/px · 6 of 15 slices shown (1 of 2)]
[im 1/15]
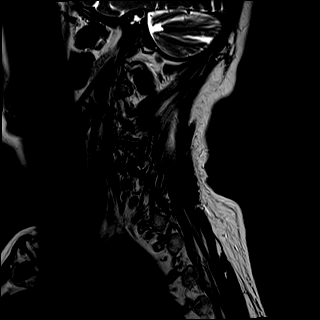
[im 3/15]
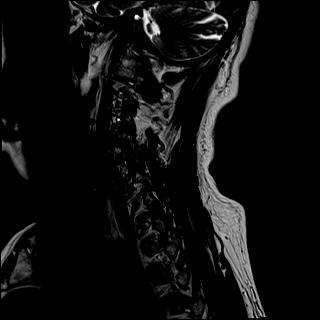
[im 6/15]
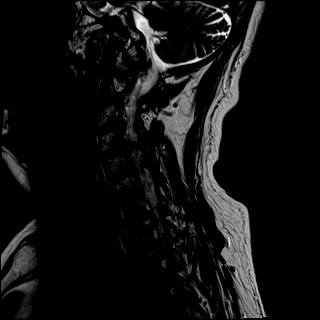
[im 9/15]
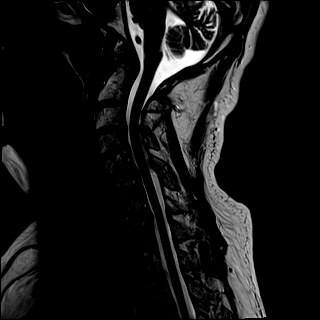
[im 12/15]
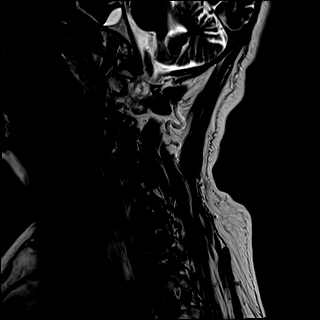
[im 15/15]
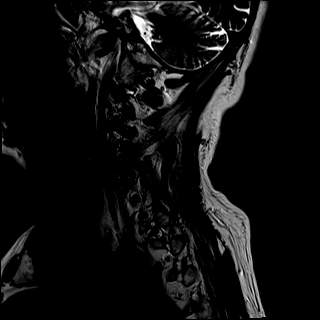

[Series 17: T1 · sagittal · 3.0mm · 0.69mm/px · 6 of 15 slices shown]
[im 1/15]
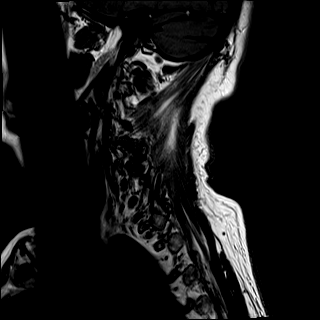
[im 3/15]
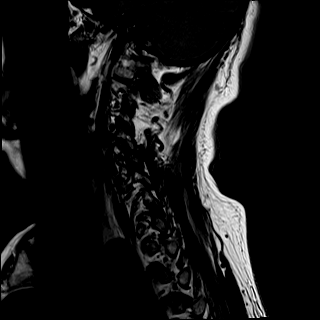
[im 6/15]
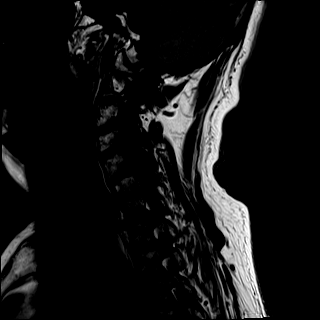
[im 9/15]
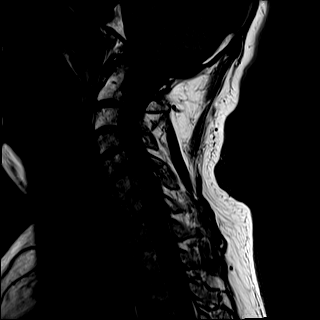
[im 12/15]
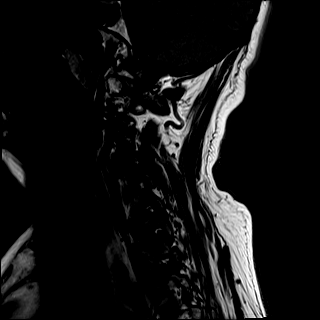
[im 15/15]
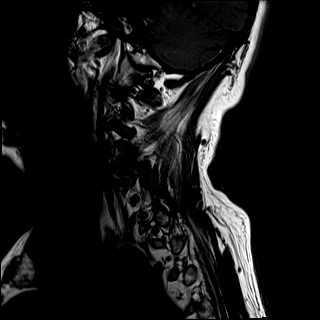

[Series 18: STIR · sagittal · 3.0mm · 0.86mm/px · 6 of 15 slices shown]
[im 1/15]
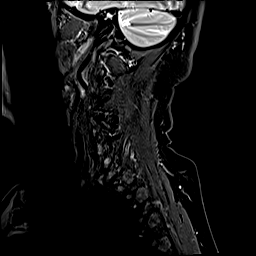
[im 3/15]
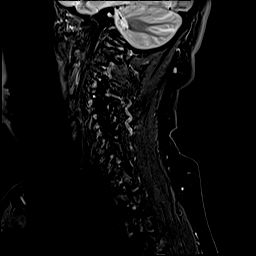
[im 6/15]
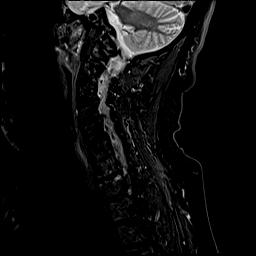
[im 9/15]
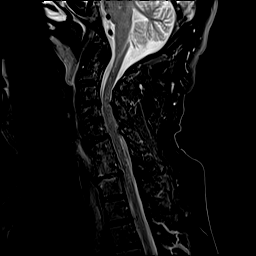
[im 12/15]
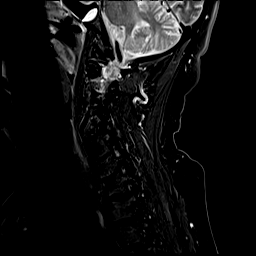
[im 15/15]
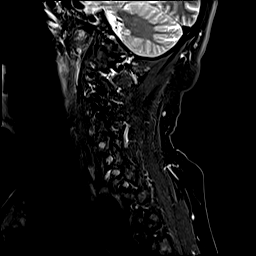

[Series 19: T2 · oblique · 3.0mm · 0.66mm/px · 9 of 36 slices shown (2 of 2)]
[im 1/36]
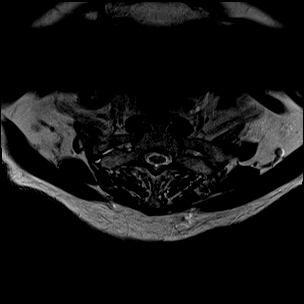
[im 6/36]
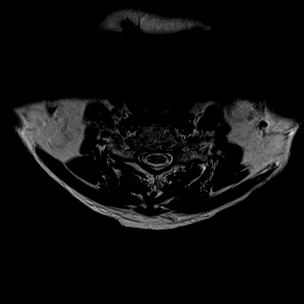
[im 11/36]
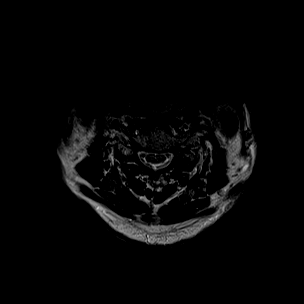
[im 16/36]
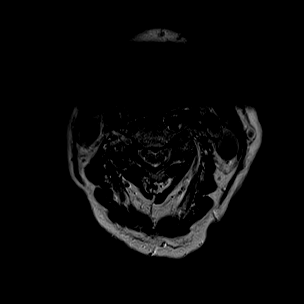
[im 18/36]
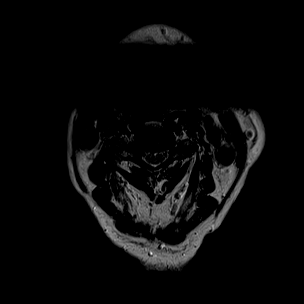
[im 21/36]
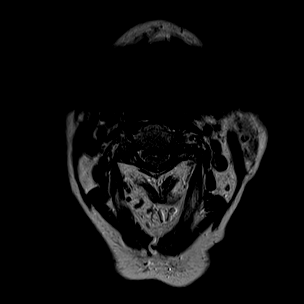
[im 26/36]
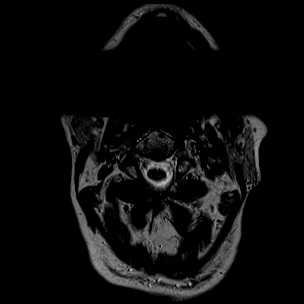
[im 31/36]
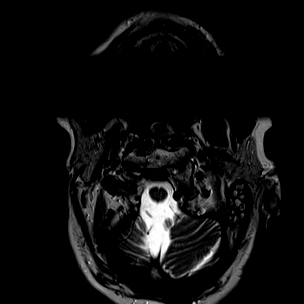
[im 36/36]
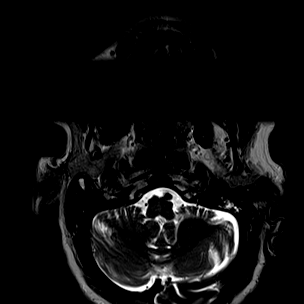

[Series 20: GRE · oblique · 3.0mm · 0.35mm/px · 7 of 36 slices shown]
[im 1/36]
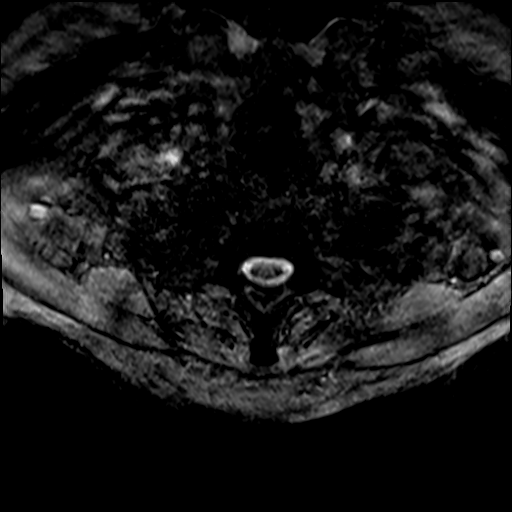
[im 6/36]
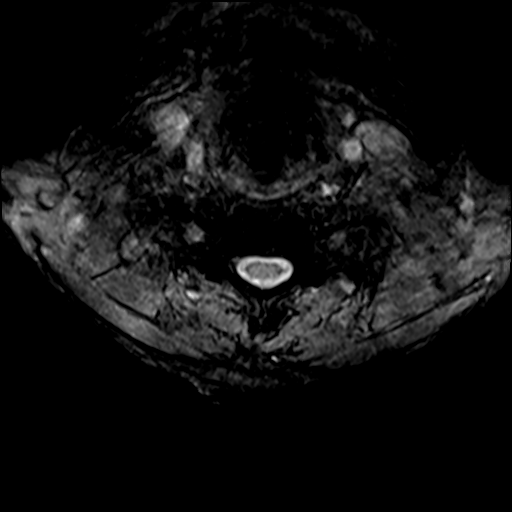
[im 11/36]
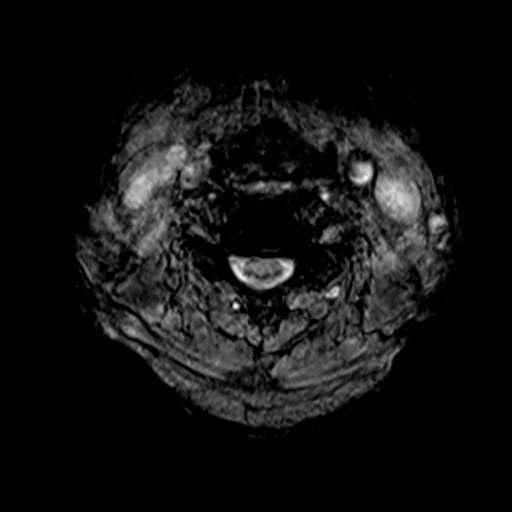
[im 16/36]
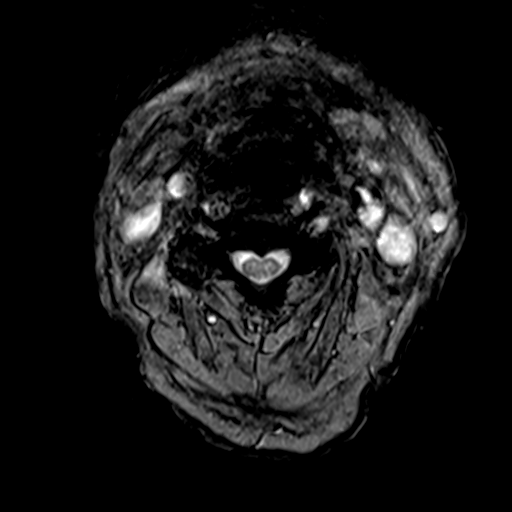
[im 21/36]
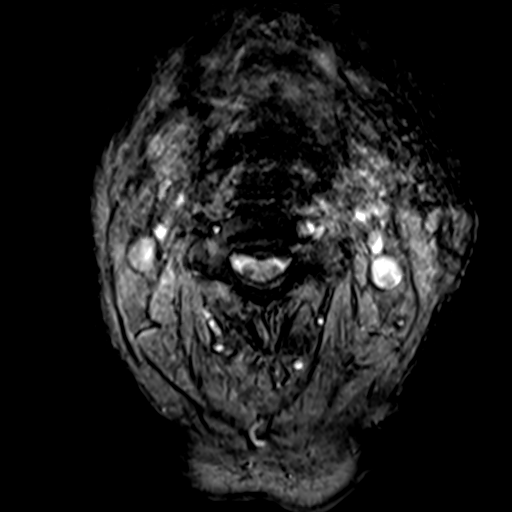
[im 26/36]
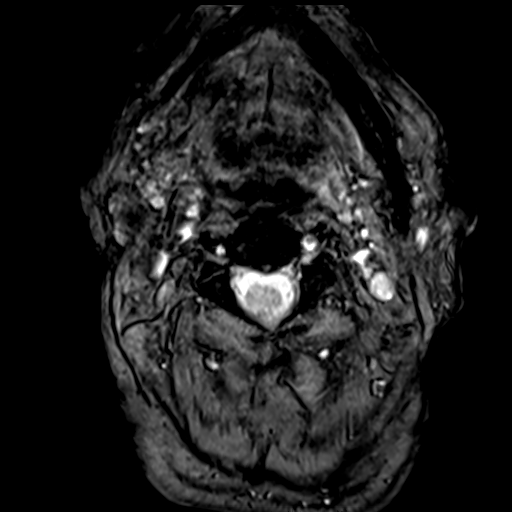
[im 31/36]
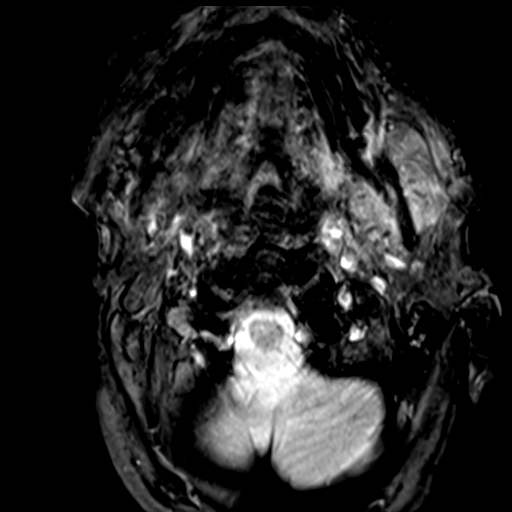

[34 of 48 positions shown; findings below may reference images not displayed]

FINDINGS: MRI CERVICAL SPINE FINDINGS

Alignment: Straightening of the cervical curvature with small
anterolisthesis of C5 over C6.

Vertebrae: No fracture, evidence of discitis, or bone lesion. C6-7
partial vertebral body fusion.

Cord: Normal signal and morphology.

Posterior Fossa, vertebral arteries, paraspinal tissues: Bilateral
mastoid effusion, left greater than right.

Disc levels:

C2-3: No spinal canal or neural foraminal stenosis.

C3-4: Posterior disc osteophyte complex resulting in mild spinal
canal stenosis. Uncovertebral and facet degenerative changes with
left facet hypertrophy resulting in severe bilateral neural
foraminal narrowing.

C4-5: Posterior disc osteophyte complex resulting in mild spinal
canal stenosis. Uncovertebral and facet degenerative changes
resulting in moderate bilateral neural foraminal narrowing.

C5-6: Posterior disc osteophyte complex resulting in mild spinal
canal stenosis. Uncovertebral and facet degenerative changes
resulting moderate bilateral neural foraminal narrowing.

C6-7: Uncovertebral degenerative changes resulting in mild left
neural foraminal narrowing. No spinal canal stenosis.

C7-T1: Posterior disc osteophyte complex resulting in mild spinal
canal stenosis. Uncovertebral and facet degenerative changes
resulting in mild-to-moderate bilateral neural foraminal narrowing

MRI LUMBAR SPINE FINDINGS

Segmentation:  Standard.

Alignment: Grade 1 anterolisthesis of L4 over L5 related to facet
osteoarthritis.

Vertebrae:  No fracture, evidence of discitis, or bone lesion.

Conus medullaris and cauda equina: Conus extends to the L1 level.
Conus appear normal. Two diminutive round lesions are seen within
the thecal sac at the L1-2 and L2-3 levels (series 12, image 9)
measuring 2 mm and 1 mm, respectively.

Paraspinal and other soft tissues: Negative.

Disc levels:

T12-L1: No spinal canal or neural foraminal stenosis.

L1-2: No spinal canal or neural foraminal stenosis.

L2-3: Disc bulge and mild facet degenerative changes resulting in
mild spinal canal stenosis and mild bilateral neural foraminal
narrowing.

L3-4: Disc bulge, moderate hypertrophic facet degenerative changes
ligamentum flavum redundancy resulting in mild spinal canal stenosis
with narrowing of the bilateral subarticular zones and mild
bilateral neural foraminal narrowing.

L4-5: Disc bulge/disc uncovering with prominent hypertrophic facet
degenerative changes and ligamentum flavum redundancy resulting in
severe spinal canal stenosis and mild-to-moderate bilateral neural
foraminal narrowing.

L5-S1: Mild-to-moderate facet degenerative changes. No significant
spinal canal or neural foraminal stenosis.
IMPRESSION: 1. Degenerative changes of the cervical spine with mild spinal canal
stenosis from C3-4 through C5-6 and at C7-T1.
2. Severe bilateral neural foraminal narrowing at C3-4.
3. Moderate bilateral neural foraminal narrowing at C4-5 and C5-6.
4. Degenerative changes of the lumbar spine, more pronounced at L4-5
where prominent hypertrophic facet osteoarthritis contribute for
severe spinal canal stenosis and mild-to-moderate bilateral neural
foraminal narrowing.
5. Two diminutive round lesions are seen within the thecal sac at
the L1-2 and L2-3 levels. In the absence of known history of
malignancy, these most likely represent benign nerve sheath tumors.

## 2023-10-31 IMAGING — MR MR LUMBAR SPINE W/O CM
4 of 5 series · 28 of 48 positions shown · non-contrast
Comparison: None.

CLINICAL DATA: Dizziness R42 (51O-7F-CM); lightheadedness R42
(51O-7F-CM); weakness 1L9.9 (51O-7F-CM).

Weakness of lower extremity, unspecified laterality
(51O-7F-CM).
EXAM:
MRI CERVICAL AND LUMBAR SPINE WITHOUT CONTRAST
TECHNIQUE: Multiplanar and multiecho pulse sequences of the cervical spine, to
include the craniocervical junction and cervicothoracic junction,
and lumbar spine, were obtained without intravenous contrast.

[Series 12: T2 · sagittal · 4.0mm · 0.73mm/px · 8 of 15 slices shown (1 of 2)]
[im 1/15]
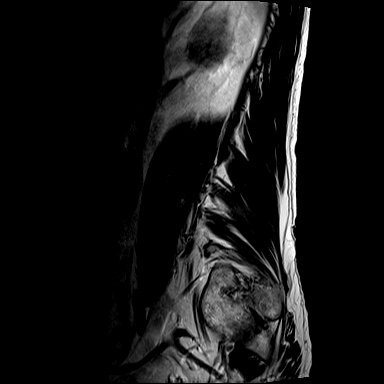
[im 3/15]
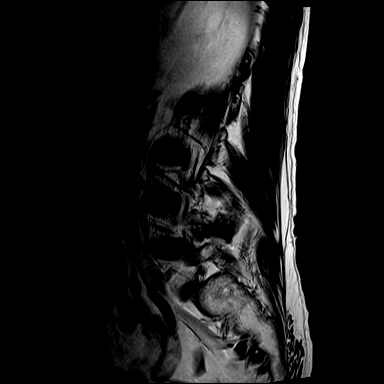
[im 5/15]
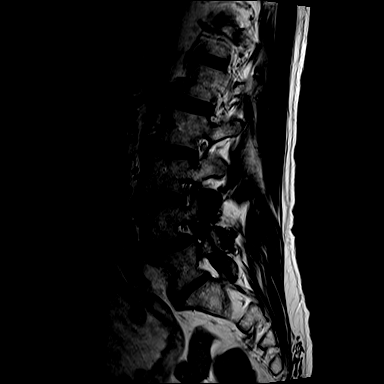
[im 7/15]
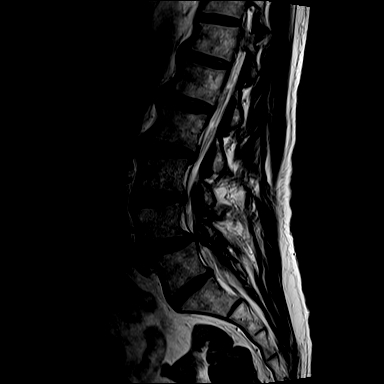
[im 9/15]
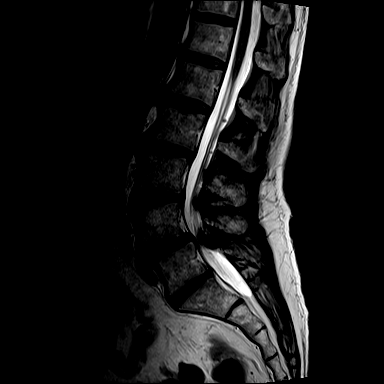
[im 11/15]
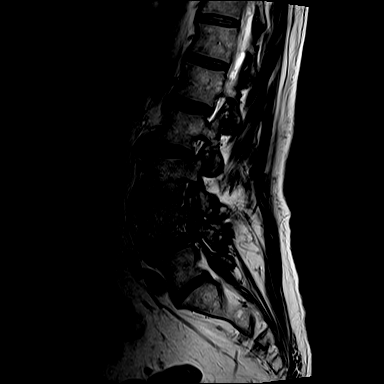
[im 13/15]
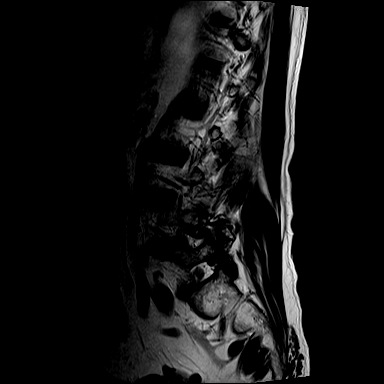
[im 15/15]
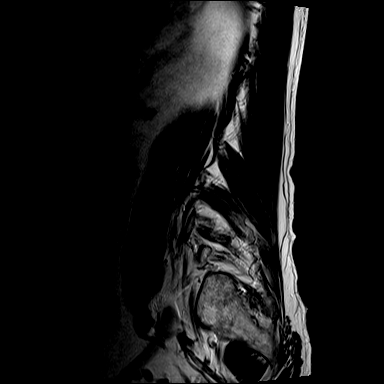

[Series 14: T1 · sagittal · 4.0mm · 0.88mm/px · 8 of 15 slices shown (1 of 2)]
[im 1/15]
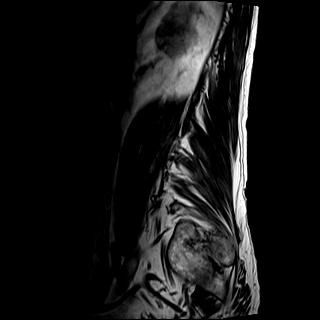
[im 3/15]
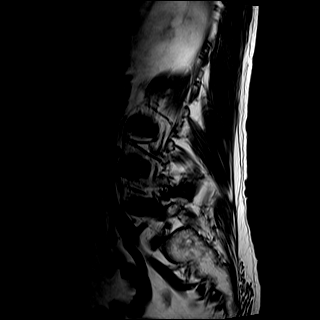
[im 5/15]
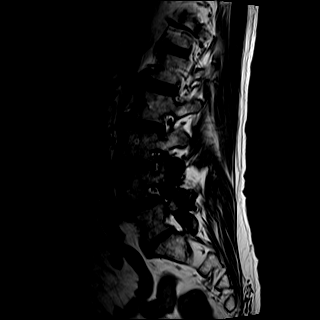
[im 7/15]
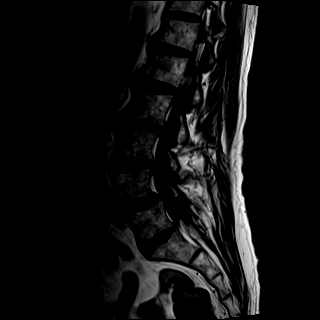
[im 9/15]
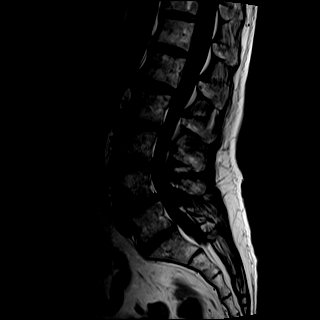
[im 11/15]
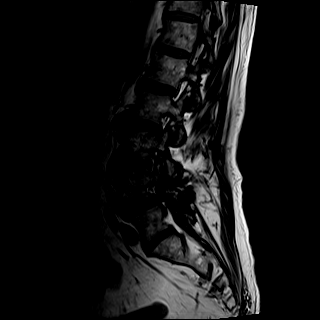
[im 13/15]
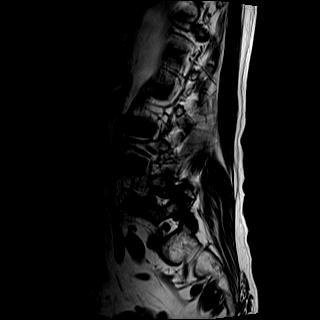
[im 15/15]
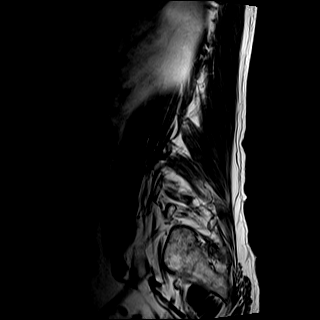

[Series 15: T2 · axial · 5.0mm · 0.57mm/px · z∈[-509,-315]mm · 9 of 23 slices shown (2 of 2)]
[im 1/23]
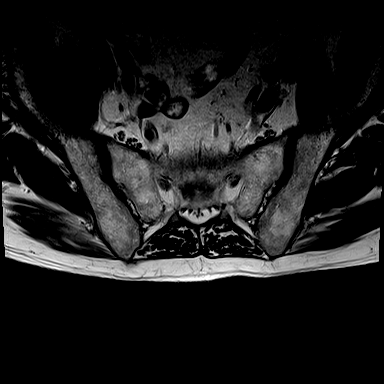
[im 5/23]
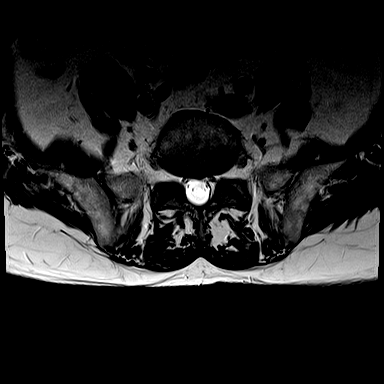
[im 7/23]
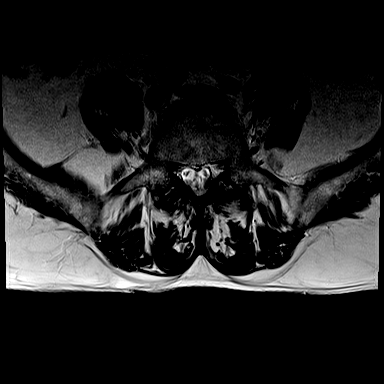
[im 11/23]
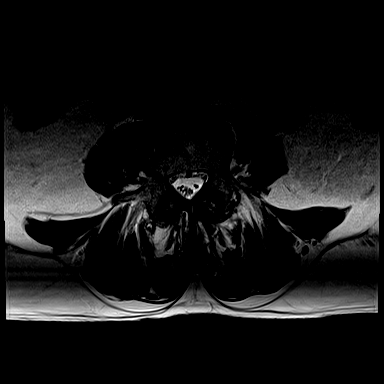
[im 13/23]
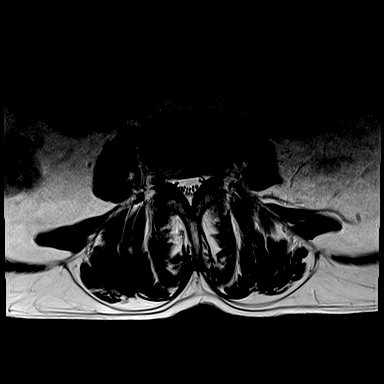
[im 17/23]
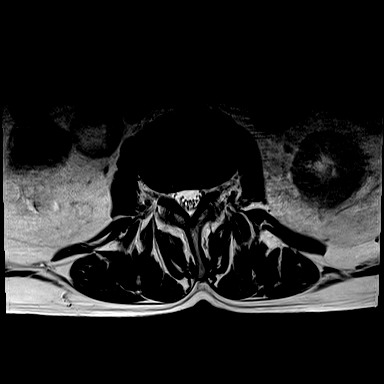
[im 19/23]
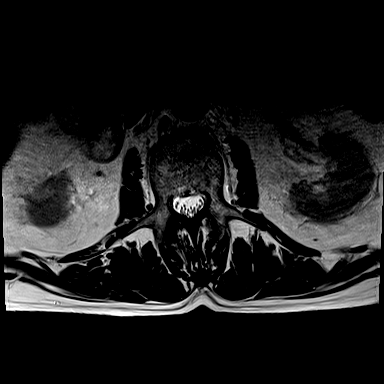
[im 21/23]
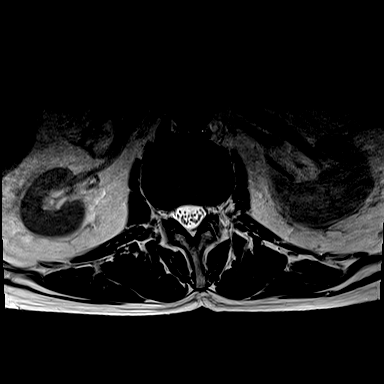
[im 23/23]
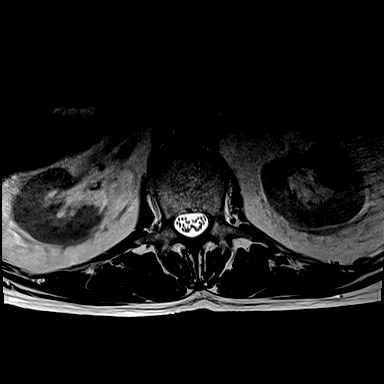

[Series 16: T1 · axial · 5.0mm · 0.34mm/px · z∈[-480,-330]mm · 3 of 23 slices shown (2 of 2)]
[im 5/23]
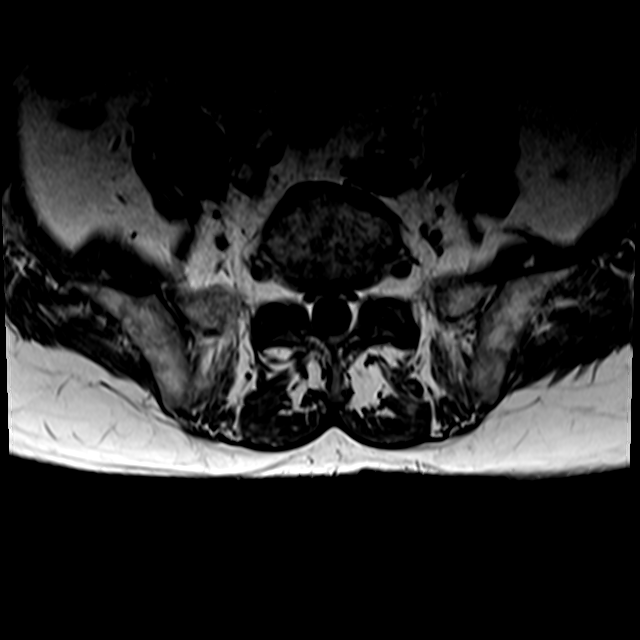
[im 13/23]
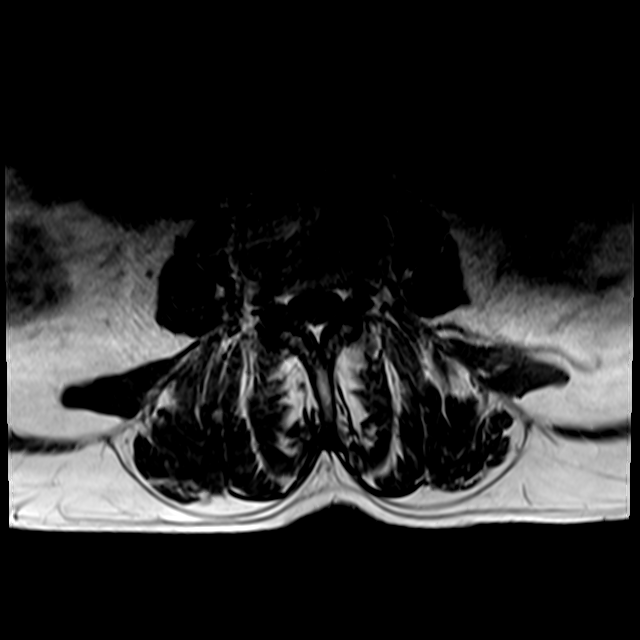
[im 21/23]
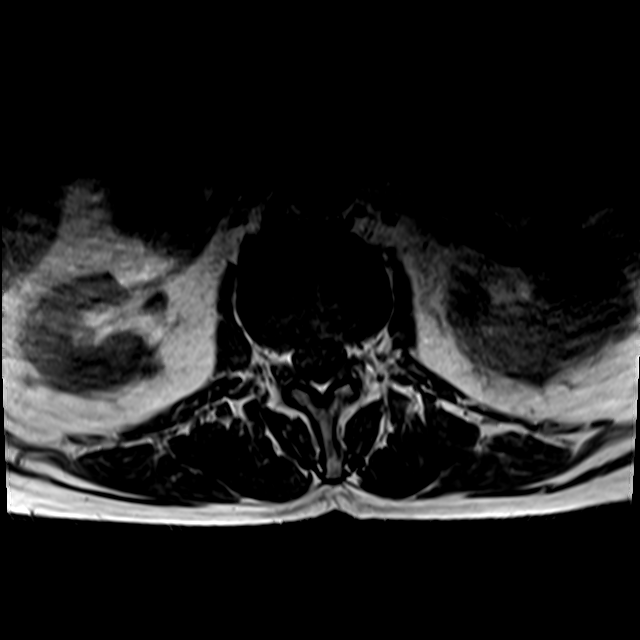

[28 of 48 positions shown; findings below may reference images not displayed]

FINDINGS: MRI CERVICAL SPINE FINDINGS

Alignment: Straightening of the cervical curvature with small
anterolisthesis of C5 over C6.

Vertebrae: No fracture, evidence of discitis, or bone lesion. C6-7
partial vertebral body fusion.

Cord: Normal signal and morphology.

Posterior Fossa, vertebral arteries, paraspinal tissues: Bilateral
mastoid effusion, left greater than right.

Disc levels:

C2-3: No spinal canal or neural foraminal stenosis.

C3-4: Posterior disc osteophyte complex resulting in mild spinal
canal stenosis. Uncovertebral and facet degenerative changes with
left facet hypertrophy resulting in severe bilateral neural
foraminal narrowing.

C4-5: Posterior disc osteophyte complex resulting in mild spinal
canal stenosis. Uncovertebral and facet degenerative changes
resulting in moderate bilateral neural foraminal narrowing.

C5-6: Posterior disc osteophyte complex resulting in mild spinal
canal stenosis. Uncovertebral and facet degenerative changes
resulting moderate bilateral neural foraminal narrowing.

C6-7: Uncovertebral degenerative changes resulting in mild left
neural foraminal narrowing. No spinal canal stenosis.

C7-T1: Posterior disc osteophyte complex resulting in mild spinal
canal stenosis. Uncovertebral and facet degenerative changes
resulting in mild-to-moderate bilateral neural foraminal narrowing

MRI LUMBAR SPINE FINDINGS

Segmentation:  Standard.

Alignment: Grade 1 anterolisthesis of L4 over L5 related to facet
osteoarthritis.

Vertebrae:  No fracture, evidence of discitis, or bone lesion.

Conus medullaris and cauda equina: Conus extends to the L1 level.
Conus appear normal. Two diminutive round lesions are seen within
the thecal sac at the L1-2 and L2-3 levels (series 12, image 9)
measuring 2 mm and 1 mm, respectively.

Paraspinal and other soft tissues: Negative.

Disc levels:

T12-L1: No spinal canal or neural foraminal stenosis.

L1-2: No spinal canal or neural foraminal stenosis.

L2-3: Disc bulge and mild facet degenerative changes resulting in
mild spinal canal stenosis and mild bilateral neural foraminal
narrowing.

L3-4: Disc bulge, moderate hypertrophic facet degenerative changes
ligamentum flavum redundancy resulting in mild spinal canal stenosis
with narrowing of the bilateral subarticular zones and mild
bilateral neural foraminal narrowing.

L4-5: Disc bulge/disc uncovering with prominent hypertrophic facet
degenerative changes and ligamentum flavum redundancy resulting in
severe spinal canal stenosis and mild-to-moderate bilateral neural
foraminal narrowing.

L5-S1: Mild-to-moderate facet degenerative changes. No significant
spinal canal or neural foraminal stenosis.
IMPRESSION: 1. Degenerative changes of the cervical spine with mild spinal canal
stenosis from C3-4 through C5-6 and at C7-T1.
2. Severe bilateral neural foraminal narrowing at C3-4.
3. Moderate bilateral neural foraminal narrowing at C4-5 and C5-6.
4. Degenerative changes of the lumbar spine, more pronounced at L4-5
where prominent hypertrophic facet osteoarthritis contribute for
severe spinal canal stenosis and mild-to-moderate bilateral neural
foraminal narrowing.
5. Two diminutive round lesions are seen within the thecal sac at
the L1-2 and L2-3 levels. In the absence of known history of
malignancy, these most likely represent benign nerve sheath tumors.

## 2023-10-31 IMAGING — MR MR ORBITS WO/W CM
4 of 8 series · 22 of 48 positions shown · IV contrast (gadavist)
Comparison: MRI of the brain July 08, 2017.

CLINICAL DATA: Strabismus 9RF.4 (PJC-3B-CM). Diplopia
(PJC-3B-CM).

EXAM:
MRI OF THE ORBITS WITHOUT AND WITH CONTRAST
TECHNIQUE: Multiplanar, multi-echo pulse sequences of the orbits and
surrounding structures were acquired including fat saturation
techniques, before and after intravenous contrast administration.
CONTRAST:  7mL GADAVIST GADOBUTROL 1 MMOL/ML IV SOLN

[Series 5: T1 · sagittal · 3.0mm · 0.38mm/px · 7 of 33 slices shown (1 of 2)]
[im 1/33]
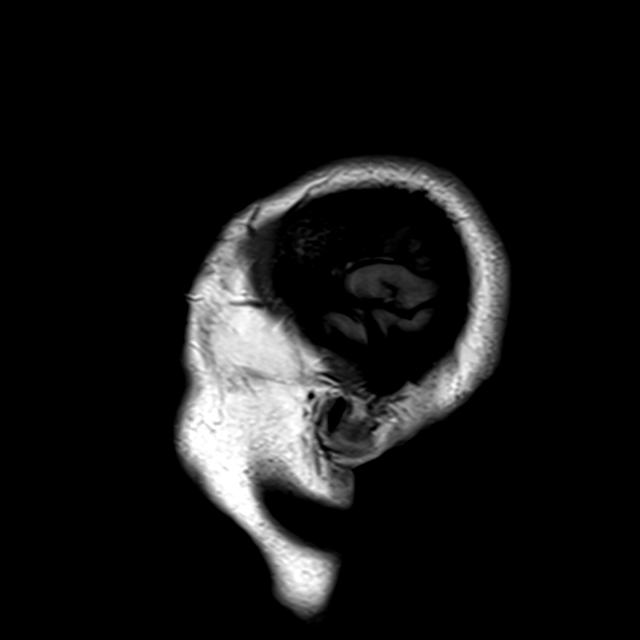
[im 4/33]
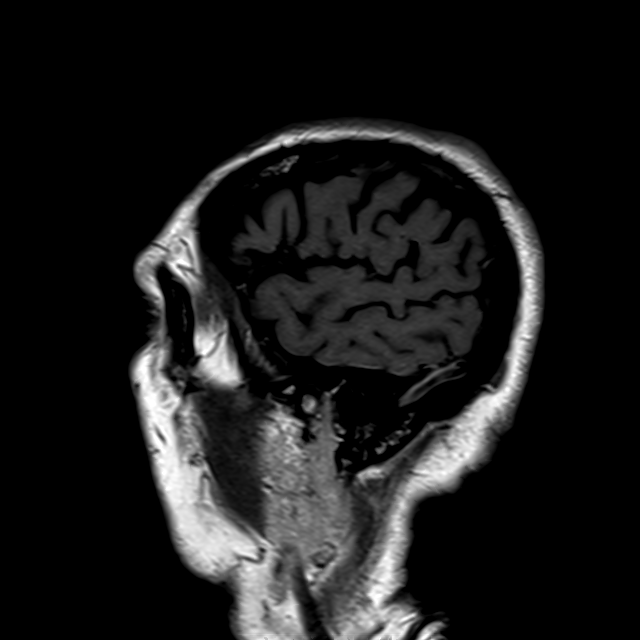
[im 11/33]
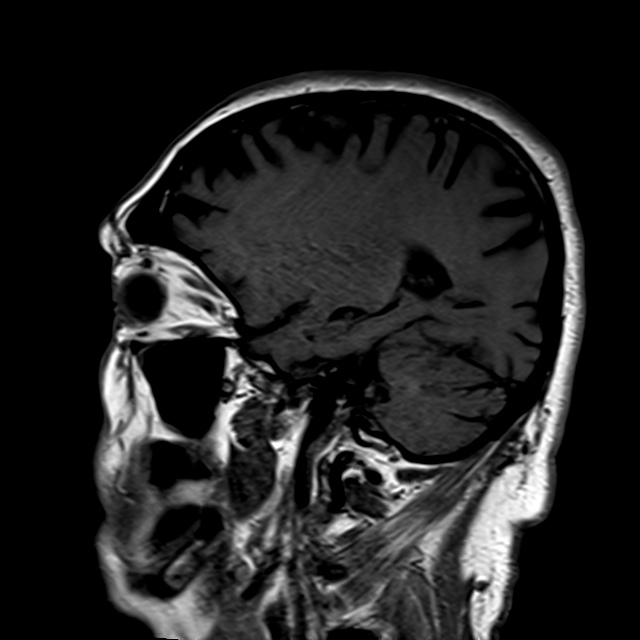
[im 15/33]
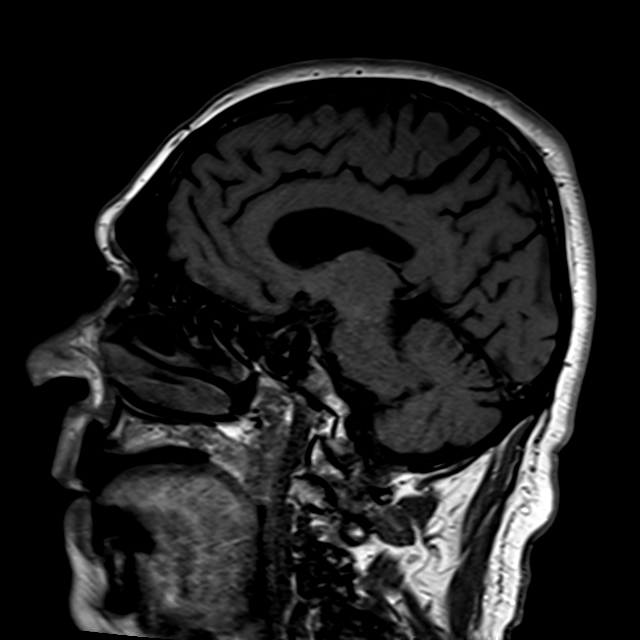
[im 18/33]
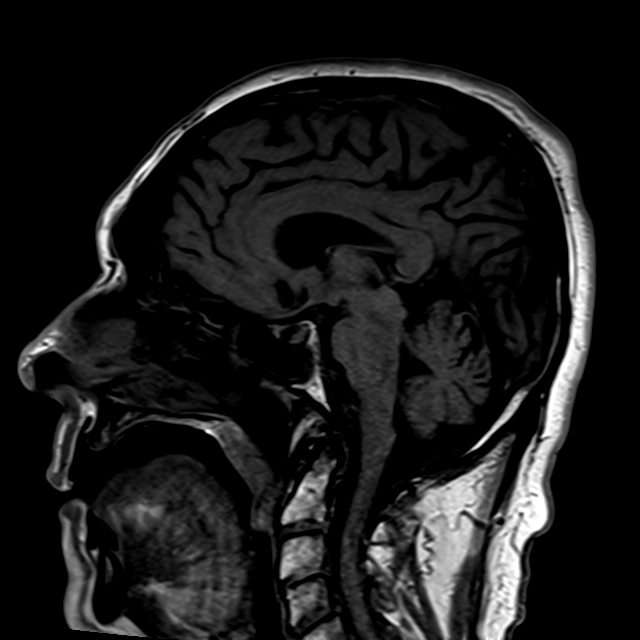
[im 22/33]
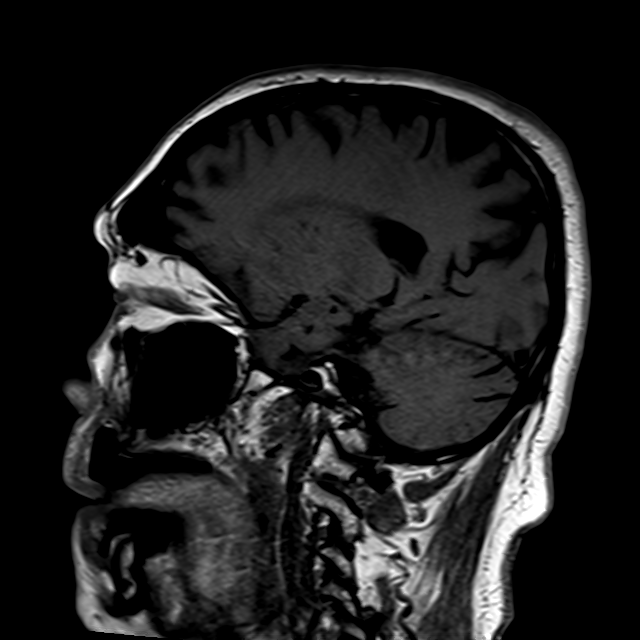
[im 29/33]
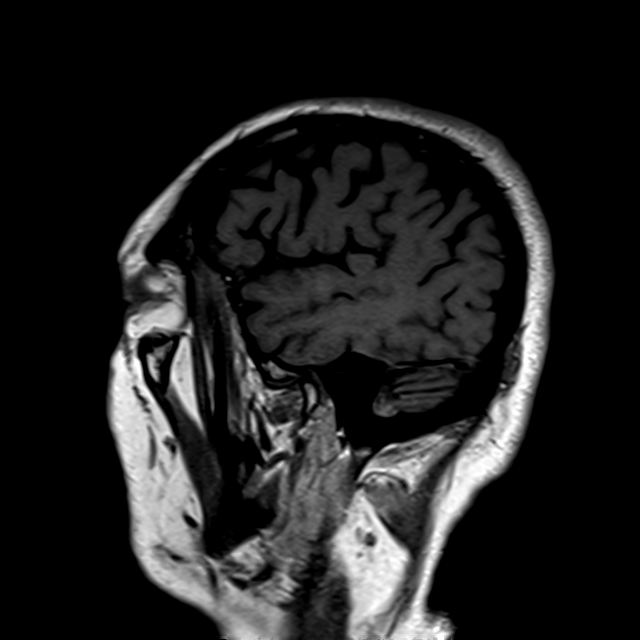

[Series 9: T2 fat-sat · axial · 3.0mm · 0.54mm/px · z∈[+89,+148]mm · 5 of 15 slices shown (1 of 2)]
[im 1/15]
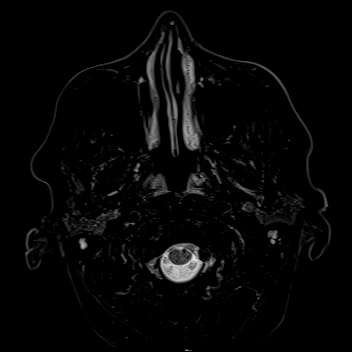
[im 4/15]
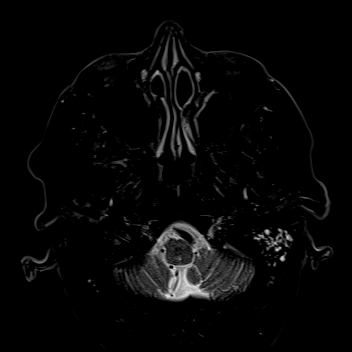
[im 8/15]
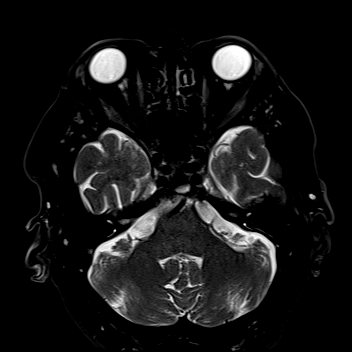
[im 11/15]
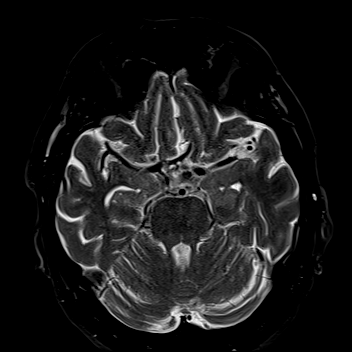
[im 15/15]
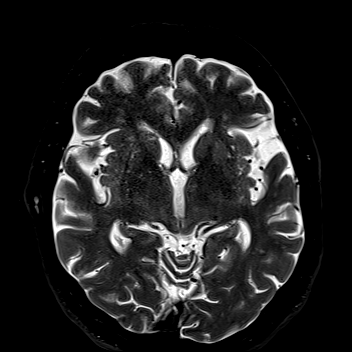

[Series 10: T1 · axial · non-contrast · 3.0mm · 0.37mm/px · z∈[+89,+148]mm · 3 of 15 slices shown (2 of 2)]
[im 1/15]
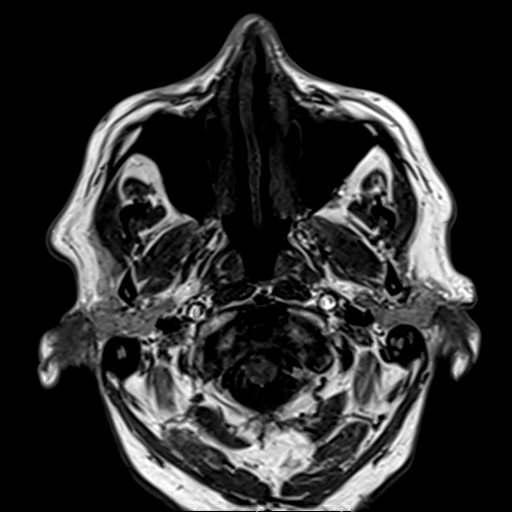
[im 10/15]
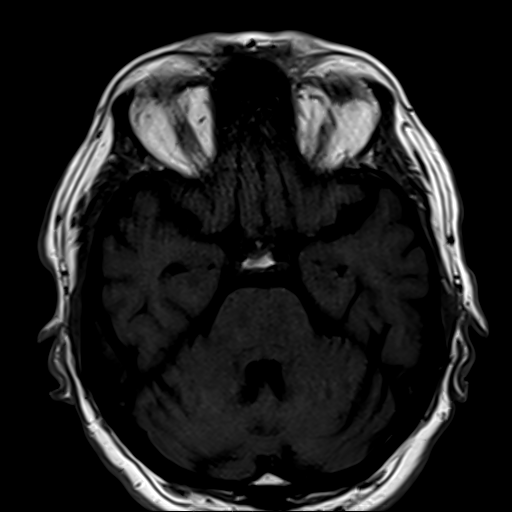
[im 15/15]
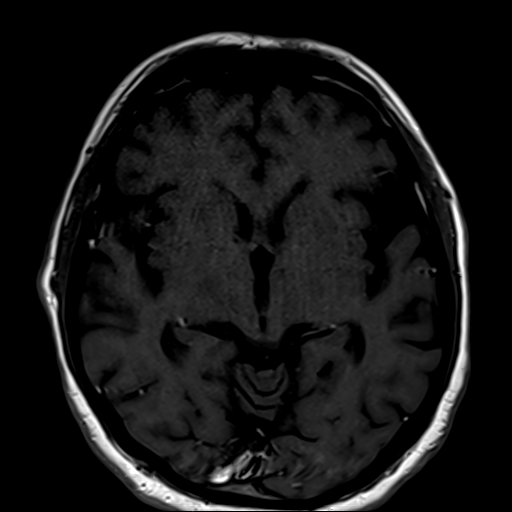

[Series 12: T2 fat-sat · coronal · 3.0mm · 0.54mm/px · 7 of 25 slices shown (2 of 2)]
[im 1/25]
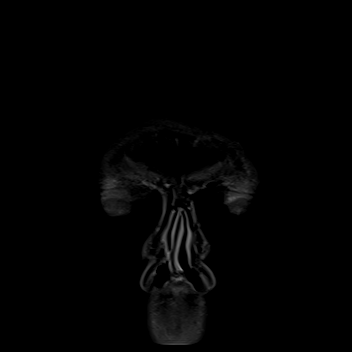
[im 5/25]
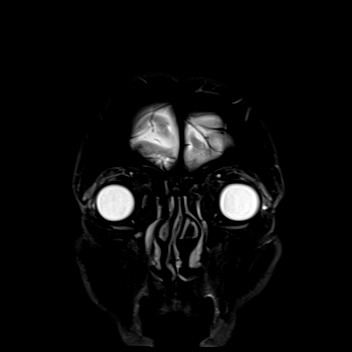
[im 9/25]
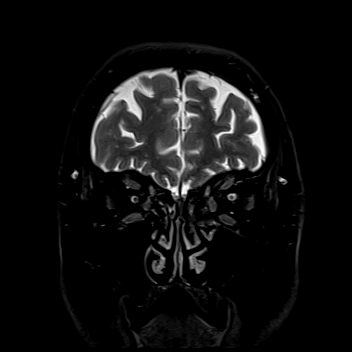
[im 13/25]
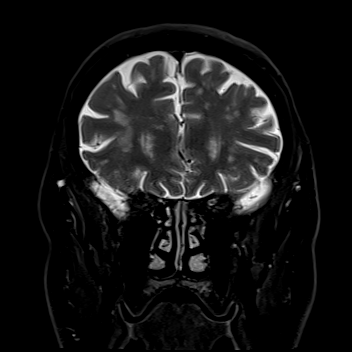
[im 17/25]
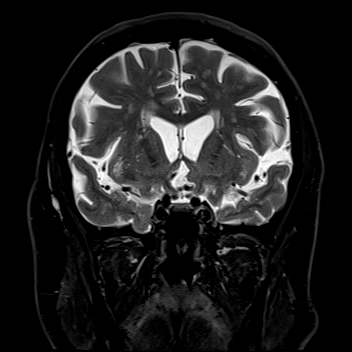
[im 21/25]
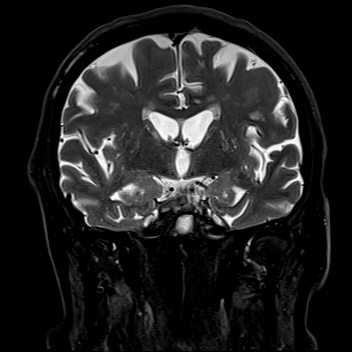
[im 25/25]
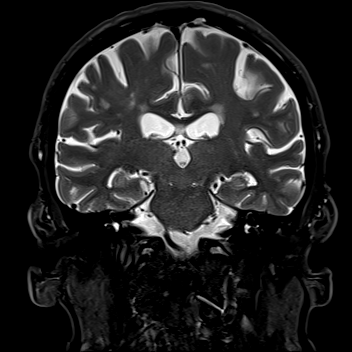

[22 of 48 positions shown; findings below may reference images not displayed]

FINDINGS: Orbits: Bilateral lens surgery. No orbital mass or evidence of
inflammation. Normal appearance of the globes, optic nerve-sheath
complexes, extraocular muscles, orbital fat and lacrimal glands.

Visualized sinuses: Mucous retention cyst in the right sphenoid
sinus. Bilateral mastoid effusions, left greater than right.

Soft tissues: Normal.

Limited intracranial: No acute or significant finding.
IMPRESSION: 1. Unremarkable MRI of the orbits.
2. Bilateral mastoid effusion.

## 2023-11-01 ENCOUNTER — Ambulatory Visit: Payer: No Typology Code available for payment source | Attending: Nurse Practitioner | Admitting: Nurse Practitioner

## 2023-11-01 ENCOUNTER — Encounter: Payer: Self-pay | Admitting: Nurse Practitioner

## 2023-11-01 VITALS — BP 130/74 | HR 88 | Ht 70.0 in | Wt 157.0 lb

## 2023-11-01 DIAGNOSIS — I10 Essential (primary) hypertension: Secondary | ICD-10-CM

## 2023-11-01 DIAGNOSIS — Z95818 Presence of other cardiac implants and grafts: Secondary | ICD-10-CM

## 2023-11-01 DIAGNOSIS — T148XXD Other injury of unspecified body region, subsequent encounter: Secondary | ICD-10-CM

## 2023-11-01 DIAGNOSIS — R0609 Other forms of dyspnea: Secondary | ICD-10-CM | POA: Diagnosis not present

## 2023-11-01 DIAGNOSIS — I480 Paroxysmal atrial fibrillation: Secondary | ICD-10-CM

## 2023-11-01 DIAGNOSIS — D696 Thrombocytopenia, unspecified: Secondary | ICD-10-CM

## 2023-11-01 DIAGNOSIS — I2510 Atherosclerotic heart disease of native coronary artery without angina pectoris: Secondary | ICD-10-CM

## 2023-11-01 DIAGNOSIS — E785 Hyperlipidemia, unspecified: Secondary | ICD-10-CM

## 2023-11-01 DIAGNOSIS — T148XXA Other injury of unspecified body region, initial encounter: Secondary | ICD-10-CM

## 2023-11-01 DIAGNOSIS — J449 Chronic obstructive pulmonary disease, unspecified: Secondary | ICD-10-CM

## 2023-11-01 DIAGNOSIS — I350 Nonrheumatic aortic (valve) stenosis: Secondary | ICD-10-CM

## 2023-11-01 DIAGNOSIS — R0789 Other chest pain: Secondary | ICD-10-CM

## 2023-11-01 DIAGNOSIS — R42 Dizziness and giddiness: Secondary | ICD-10-CM

## 2023-11-01 DIAGNOSIS — Z720 Tobacco use: Secondary | ICD-10-CM

## 2023-11-01 DIAGNOSIS — N183 Chronic kidney disease, stage 3 unspecified: Secondary | ICD-10-CM

## 2023-11-01 MED ORDER — NICOTINE 21 MG/24HR TD PT24: 21.0000 mg | MEDICATED_PATCH | Freq: Every day | TRANSDERMAL | 0 refills | Status: AC

## 2023-11-01 NOTE — Patient Instructions (Addendum)
 Medication Instructions:  Your physician has recommended you make the following change in your medication:  It is very important that he quit smoking. There are various alternatives available to help with this difficult task, but first and foremost, he must make a firm commitment and decision to quit. The nature of nicotine  addiction is discussed. The usefulness of behavioral therapy is discussed and suggested.  The correct use, cost and side effects of nicotine  replacement therapy such as gum or patches is discussed. Bupropion and its cost (sometimes not covered fully by insurance) and side effects are reviewed. The quit rates are discussed. I recommend he not allow potential costs of treatment to deter him from using nicotine  replacement therapy or bupropion, as the long term economic and health benefits are obvious.  Labwork: None   Testing/Procedures: None   Follow-Up: Your physician recommends that you schedule a follow-up appointment in: 6-8 weeks   Any Other Special Instructions Will Be Listed Below (If Applicable).  If you need a refill on your cardiac medications before your next appointment, please call your pharmacy.

## 2023-11-01 NOTE — Progress Notes (Signed)
Cardiology Office Note:  .   Date:  11/01/2023 ID:  Nicolas Berry, DOB September 01, 1946, MRN [id number] PCP: [facility] Providers Cardiologist:  Dina Rich, MD Electrophysiologist:  Lanier Prude, MD    History of Present Illness: Nicolas Berry is a 78 y.o. male with a PMH of PAF, CAD, hypertension, hyperlipidemia, valvular heart disease, dizziness, carotid bruits, COPD, CKD, history of GI bleed, who presents today for scheduled follow-up.  Last seen by Dr. Dina Rich on June 30, 2023.  He was pending plans for future watchman implant later that month.  While on Eliquis, he noted intermittent dark stools.  It was noted he had long history of dizziness with negative orthostatics.  He had received extensive workup by neuro, was felt to be not cardiac in etiology.  He underwent LAAO with Watchman 27 mm FLX Pro device placed on July 15, 2023.  Was restarted on Eliquis with plans to continue for 45 days (until August 29, 2023), and at that time would stop Eliquis and start Plavix 75 mg to complete 6 months of therapy until January 13, 2024.  Was noted that he did not require SBE prophylaxis as patient had full set of dentures.  TEE 08/2023 revealed EF 60 to 65%, moderate aortic valve stenosis with SAM, systolic anterior motion of anterior leaflet noted with LVOT turbulence, well-placed Watchman device with no leak noted.   Today he presents for scheduled follow-up.  He states his main concern is the bruising along his arms. Does notices some intermittent CP at times for at least the past 6 months. Says it is particularly noticed at rest, described as ache, and lasts a few minutes at a time, denies any specific triggers, and denies any radiation. 2/10 in intensity. Rare in occurrence and random when it happens. Does admit to intermittent SHOB noticed with walking uphill while playing golf and has been ongoing since he had his knee surgery over 1  year ago. This DOE has been stable over time. Continues to note chronic and stable dizziness at times, says he has not been taking the meclizine like he should be. Denies any palpitations, syncope, presyncope, orthopnea, PND, swelling or significant weight changes, acute bleeding, or claudication.  ROS: Negative. See HPI.  SH: Smokes 2 packs per day. Very active and plays golf several days per week.   Studies Reviewed: Marland Kitchen    EKG: EKG is not ordered today.   TEE 08/2023:  IMPRESSIONS    1. Mixed subvalvular gradient and AS with SAM and trivial MR Mean AV graident is 15 mmHg peak 25 mmHg Peak LVOT graident is 11 mmHg.   2. Well placed 27 mm Watchman FLX device with stable 1/3 mitral shoulder no leak by color flow Average compression 15%.   3. Left ventricular ejection fraction, by estimation, is 60 to 65%. The  left ventricle has normal function.   4. Right ventricular systolic function is normal. The right ventricular  size is normal.   5. Left atrial size was mildly dilated. No left atrial/left atrial  appendage thrombus was detected.   6. Systolic anterior motion of anterior leaflet noted with LVOT  turbulence. The mitral valve is abnormal. Trivial mitral valve  regurgitation.   7. The aortic valve is tricuspid. There is moderate calcification of the  aortic valve. There is moderate thickening of the aortic valve. Aortic  valve regurgitation is not visualized. Mild to moderate aortic valve  stenosis.  Echo 04/2023:  1. Left ventricular ejection fraction, by estimation, is 60 to 65%. The  left ventricle has normal function. The left ventricle has no regional  wall motion abnormalities. Left ventricular diastolic parameters are  consistent with Grade I diastolic  dysfunction (impaired relaxation).   2. Right ventricular systolic function is normal. The right ventricular  size is normal. There is mildly elevated pulmonary artery systolic  pressure.   3. The mitral valve is abnormal.  Trivial mitral valve regurgitation. No  evidence of mitral stenosis. Severe mitral annular calcification.   4. The aortic valve is tricuspid. There is moderate calcification of the  aortic valve. Aortic valve regurgitation is not visualized. Aortic valve  sclerosis/calcification is present, without any evidence of aortic  stenosis. Aortic valve mean gradient  measures 10.0 mmHg. Aortic valve Vmax measures 2.02 m/s.   5. The inferior vena cava is normal in size with greater than 50%  respiratory variability, suggesting right atrial pressure of 3 mmHg.   Comparison(s): No significant change from prior study.  Lexiscan 04/2016:  There was no ST segment deviation noted during stress. No T wave inversion was noted during stress. The study is normal. This is a low risk study. The left ventricular ejection fraction is hyperdynamic (>65%).  Physical Exam:   VS:  BP 130/74   Pulse 88   Ht 5\' 10"  (1.778 m)   Wt 157 lb (71.2 kg)   SpO2 99%   BMI 22.53 kg/m    Wt Readings from Last 3 Encounters:  11/01/23 157 lb (71.2 kg)  09/08/23 157 lb (71.2 kg)  08/23/23 164 lb 3.2 oz (74.5 kg)    GEN: Well nourished, well developed in no acute distress NECK: No JVD; No carotid bruits CARDIAC: S1/S2, RRR, no murmurs, rubs, gallops RESPIRATORY:  Clear to auscultation without rales, wheezing or rhonchi  ABDOMEN: Soft, non-tender, non-distended EXTREMITIES:  No edema; No deformity   ASSESSMENT AND PLAN: .    PAF, s/p LAAO with Watchman device (06/2023) TEE 08/2023 revealed well placed Watchman device with no leak noted. Denies any tachycardia or palpitations. HR is well controlled today. Continue Coreg and Plavix. Completed Eliquis therapy. Heart healthy diet encouraged.   Aortic valve stenosis, DOE TEE 08/2023 revealed mild to moderate aortic valve stenosis. Admits to chronic and stable DOE while walking uphill - etiology multifactorial. Denies any red flag signs and symptoms. He is currently  following Structural Heart Team. No medication changes at this time. Continue to f/u with Structural Heart Team. Will notify medical team of this.   CAD, atypical chest pain Admits to atypical CP over the past 6 months. No indication for ischemic evaluation at this time. Most recent NST in 2017 was normal. Discussed treatment options with patient and came to shared medical decision that we will continue to monitor at this time. Continue current medication regimen. Heart healthy diet encouraged. Care and ED precautions discussed.   HTN BP is stable. Discussed to monitor BP at home at least 2 hours after medications and sitting for 5-10 minutes. No medication changes at this time. Heart healthy diet encouraged.  HLD No recent labs on file. Will request most recent labs from PCP's office. Continue rosuvastatin. Continue to follow with PCP. Heart healthy diet encouraged.   COPD, tobacco abuse Admits to stable, DOE while walking uphill for over the past year. Smokes 2 packs per day. Continue current medication regimen. Continue to follow with PCP. Care and ED precautions discussed. Smoking cessation encouraged and discussed. Will write Rx  for nicotine patches.   Dizziness Chronic and stable. Being managed by PCP. Continue to follow with PCP.  8. CKD stage 3 Most recent labs stable. Avoid nephrotoxic agents. No medication changes at this time. Continue to follow-up with PCP and Dr. Wolfgang Phoenix.   9. Bruising, thrombocytopenia Pt has generalized bruising along bilateral forearms most likely d/t Plavix. Current platelet count is low but improving at 142, 000. Will continue to monitor. Continue to follow with PCP. Care and ED precautions discussed.   Dispo: Follow-up with me/APP in 6-8 weeks or sooner if anything changes.   Signed, Sharlene Dory, NP

## 2023-11-02 DIAGNOSIS — H40053 Ocular hypertension, bilateral: Secondary | ICD-10-CM | POA: Diagnosis not present

## 2023-11-03 ENCOUNTER — Encounter: Payer: Self-pay | Admitting: Nurse Practitioner

## 2023-11-11 ENCOUNTER — Encounter: Payer: Self-pay | Admitting: Hematology

## 2023-11-18 ENCOUNTER — Telehealth: Payer: Self-pay | Admitting: Cardiology

## 2023-11-18 NOTE — Telephone Encounter (Signed)
 Spoke with patient and he states the sie in the groin is swollen  and his upper thigh is bruised. He had watchman done in October.   Nurse Cordelia Pen called nurse navigator Karsten Fells and she states this far out patient should not have bruising and swelling from procedure. He should contact PCP to be seen.  Informed patient to contact his PCP to have area examined. He states he does not have PCP he will have to go to the [place]. I will forward to provider to see what he recommends.

## 2023-11-18 NOTE — Telephone Encounter (Signed)
 Pt states that since having Watchman procedure a few months ago. He has developed 3 red lumps/ bruises as well as the site where procedure was done has become swollen. He would like a c/b regarding this matter. Please advise

## 2023-11-18 NOTE — Telephone Encounter (Signed)
Patient's wife is calling to follow up. Please advise.

## 2023-11-19 ENCOUNTER — Ambulatory Visit (HOSPITAL_COMMUNITY): Payer: No Typology Code available for payment source

## 2023-12-03 NOTE — Progress Notes (Signed)
 Patient cancelled LDCT previously scheduled. Attempted to reach patient to reschedule follow-up LDCT. Unable to reach patient directly. Detailed VM left asking that the patient return my call.

## 2023-12-20 ENCOUNTER — Ambulatory Visit: Payer: No Typology Code available for payment source | Attending: Nurse Practitioner | Admitting: Nurse Practitioner

## 2023-12-20 ENCOUNTER — Encounter: Payer: Self-pay | Admitting: Nurse Practitioner

## 2023-12-20 VITALS — BP 124/70 | HR 64 | Ht 70.5 in | Wt 153.2 lb

## 2023-12-20 DIAGNOSIS — N183 Chronic kidney disease, stage 3 unspecified: Secondary | ICD-10-CM

## 2023-12-20 DIAGNOSIS — I480 Paroxysmal atrial fibrillation: Secondary | ICD-10-CM | POA: Diagnosis not present

## 2023-12-20 DIAGNOSIS — T148XXD Other injury of unspecified body region, subsequent encounter: Secondary | ICD-10-CM

## 2023-12-20 DIAGNOSIS — R0609 Other forms of dyspnea: Secondary | ICD-10-CM | POA: Diagnosis not present

## 2023-12-20 DIAGNOSIS — Z95818 Presence of other cardiac implants and grafts: Secondary | ICD-10-CM

## 2023-12-20 DIAGNOSIS — D696 Thrombocytopenia, unspecified: Secondary | ICD-10-CM

## 2023-12-20 DIAGNOSIS — I350 Nonrheumatic aortic (valve) stenosis: Secondary | ICD-10-CM | POA: Diagnosis not present

## 2023-12-20 DIAGNOSIS — I2510 Atherosclerotic heart disease of native coronary artery without angina pectoris: Secondary | ICD-10-CM | POA: Diagnosis not present

## 2023-12-20 DIAGNOSIS — J449 Chronic obstructive pulmonary disease, unspecified: Secondary | ICD-10-CM

## 2023-12-20 DIAGNOSIS — I10 Essential (primary) hypertension: Secondary | ICD-10-CM

## 2023-12-20 DIAGNOSIS — Z720 Tobacco use: Secondary | ICD-10-CM

## 2023-12-20 DIAGNOSIS — E785 Hyperlipidemia, unspecified: Secondary | ICD-10-CM

## 2023-12-20 DIAGNOSIS — T148XXA Other injury of unspecified body region, initial encounter: Secondary | ICD-10-CM

## 2023-12-20 DIAGNOSIS — D699 Hemorrhagic condition, unspecified: Secondary | ICD-10-CM

## 2023-12-20 NOTE — Progress Notes (Unsigned)
 Cardiology Office Note:  .   Date:  12/20/2023 ID:  Nicolas Berry, DOB [birth year], MRN [id number] PCP: [facility] Providers Cardiologist:  Dina Rich, MD Electrophysiologist:  Lanier Prude, MD    History of Present Illness: Nicolas Berry is a 78 y.o. male with a PMH of PAF, CAD, hypertension, hyperlipidemia, valvular heart disease, dizziness, carotid bruits, COPD, CKD, history of GI bleed, who presents today for scheduled follow-up.  Last seen by Dr. Dina Rich on June 30, 2023.  He was pending plans for future watchman implant later that month.  While on Eliquis, he noted intermittent dark stools.  It was noted he had long history of dizziness with negative orthostatics.  He had received extensive workup by neuro, was felt to be not cardiac in etiology.  He underwent LAAO with Watchman 27 mm FLX Pro device placed on July 15, 2023.  Was restarted on Eliquis with plans to continue for 45 days (until August 29, 2023), and at that time would stop Eliquis and start Plavix 75 mg to complete 6 months of therapy until January 13, 2024.  Was noted that he did not require SBE prophylaxis as patient had full set of dentures.  TEE 08/2023 revealed EF 60 to 65%, moderate aortic valve stenosis with SAM, systolic anterior motion of anterior leaflet noted with LVOT turbulence, well-placed Watchman device with no leak noted.   11/01/2023 - Today he presents for scheduled follow-up.  He states his main concern is the bruising along his arms. Does notices some intermittent CP at times for at least the past 6 months. Says it is particularly noticed at rest, described as ache, and lasts a few minutes at a time, denies any specific triggers, and denies any radiation. 2/10 in intensity. Rare in occurrence and random when it happens. Does admit to intermittent SHOB noticed with walking uphill while playing golf and has been ongoing since he had his knee  surgery over 1 year ago. This DOE has been stable over time. Continues to note chronic and stable dizziness at times, says he has not been taking the meclizine like he should be. Denies any palpitations, syncope, presyncope, orthopnea, PND, swelling or significant weight changes, acute bleeding, or claudication.  12/20/2023 -he presents today for follow-up.  Doing well, overall his chief concern is his bleeding/bruising.  Shows me a moist bandage on his left forearm.  Compliant with his medications and tells me he is taking Plavix. Denies any chest pain, shortness of breath, palpitations, syncope, presyncope, dizziness, orthopnea, PND, swelling or significant weight changes, or claudication.   ROS: Negative. See HPI.  SH: Smokes 2 packs per day. Very active and plays golf several days per week.   Studies Reviewed: Marland Kitchen    EKG: EKG is not ordered today.   TEE 08/2023:  IMPRESSIONS    1. Mixed subvalvular gradient and AS with SAM and trivial MR Mean AV graident is 15 mmHg peak 25 mmHg Peak LVOT graident is 11 mmHg.   2. Well placed 27 mm Watchman FLX device with stable 1/3 mitral shoulder no leak by color flow Average compression 15%.   3. Left ventricular ejection fraction, by estimation, is 60 to 65%. The  left ventricle has normal function.   4. Right ventricular systolic function is normal. The right ventricular  size is normal.   5. Left atrial size was mildly dilated. No left atrial/left atrial  appendage thrombus was detected.   6. Systolic anterior  motion of anterior leaflet noted with LVOT  turbulence. The mitral valve is abnormal. Trivial mitral valve  regurgitation.   7. The aortic valve is tricuspid. There is moderate calcification of the  aortic valve. There is moderate thickening of the aortic valve. Aortic  valve regurgitation is not visualized. Mild to moderate aortic valve  stenosis.  Echo 04/2023:  1. Left ventricular ejection fraction, by estimation, is 60 to 65%. The   left ventricle has normal function. The left ventricle has no regional  wall motion abnormalities. Left ventricular diastolic parameters are  consistent with Grade I diastolic  dysfunction (impaired relaxation).   2. Right ventricular systolic function is normal. The right ventricular  size is normal. There is mildly elevated pulmonary artery systolic  pressure.   3. The mitral valve is abnormal. Trivial mitral valve regurgitation. No  evidence of mitral stenosis. Severe mitral annular calcification.   4. The aortic valve is tricuspid. There is moderate calcification of the  aortic valve. Aortic valve regurgitation is not visualized. Aortic valve  sclerosis/calcification is present, without any evidence of aortic  stenosis. Aortic valve mean gradient  measures 10.0 mmHg. Aortic valve Vmax measures 2.02 m/s.   5. The inferior vena cava is normal in size with greater than 50%  respiratory variability, suggesting right atrial pressure of 3 mmHg.   Comparison(s): No significant change from prior study.  Lexiscan 04/2016:  There was no ST segment deviation noted during stress. No T wave inversion was noted during stress. The study is normal. This is a low risk study. The left ventricular ejection fraction is hyperdynamic (>65%).  Physical Exam:   VS:  BP 124/70   Pulse 64   Ht 5' 10.5" (1.791 m)   Wt 153 lb 3.2 oz (69.5 kg)   SpO2 97%   BMI 21.67 kg/m    Wt Readings from Last 3 Encounters:  12/20/23 153 lb 3.2 oz (69.5 kg)  11/01/23 157 lb (71.2 kg)  09/08/23 157 lb (71.2 kg)    GEN: Well nourished, well developed in no acute distress NECK: No JVD; No carotid bruits CARDIAC: S1/S2, RRR, no murmurs, rubs, gallops RESPIRATORY:  Clear to auscultation without rales, wheezing or rhonchi  ABDOMEN: Soft, non-tender, non-distended EXTREMITIES:  No edema; No deformity, thin skin and generalized bruising along bilateral forearms, bandaid with recent bleeding noted, small amount of blood  on bandaid.  ASSESSMENT AND PLAN: .    PAF, s/p LAAO with Watchman device (06/2023) TEE 08/2023 revealed well placed Watchman device with no leak noted. Denies any tachycardia or palpitations. HR is well controlled today. Continue Coreg and Plavix, and was recommended to complete 6 months of post implant therapy for Plavix with end date estimated to be end of April 2025 per office note on August 23, 2023 by Georgie Chard, NP. Completed Eliquis therapy. Heart healthy diet encouraged.  Will arrange follow-up with structural heart team as he is due to follow-up in April 2025.  He admits to easy bleeding/bruising-see below.  Will obtain CBC. Care and ED precautions discussed.   Aortic valve stenosis, DOE TEE 08/2023 revealed mild to moderate aortic valve stenosis. Admits to chronic and stable DOE while walking uphill - etiology multifactorial. Denies any red flag signs and symptoms. He is currently following Structural Heart Team. No medication changes at this time. Will arrange f/u with Structural Heart Team.   CAD Denies any chest pain. No indication for ischemic evaluation at this time. Most recent NST in 2017 was normal. Continue  current medication regimen. Heart healthy diet encouraged. Care and ED precautions discussed.   HTN BP is stable. Discussed to monitor BP at home at least 2 hours after medications and sitting for 5-10 minutes. No medication changes at this time. Heart healthy diet encouraged.  HLD No recent labs on file. Will request most recent labs from PCP's office. Continue rosuvastatin. Continue to follow with PCP. Heart healthy diet encouraged.   COPD, tobacco abuse Admits to stable, DOE while walking uphill for over the past year. Smokes 2 packs per day. Continue current medication regimen. Continue to follow with PCP. Care and ED precautions discussed. Smoking cessation encouraged and discussed.   8. CKD stage 3 Most recent labs stable. Avoid nephrotoxic agents. No  medication changes at this time. Continue to follow-up with PCP and Dr. Wolfgang Phoenix.   9. Bruising, bleeding easily, thrombocytopenia Pt has generalized bruising along bilateral forearms most likely d/t Plavix. Current platelet count is low but improving at 142, 000. Will obtain CBC. Denies any GI bleeding or coughing up blood or hematemesis. Continue to follow with PCP. Care and ED precautions discussed.   Dispo: Follow-up with Dr. Dina Rich or APP in 6 months or sooner if anything changes.   Signed, Sharlene Dory, NP

## 2023-12-20 NOTE — Patient Instructions (Signed)
 Medication Instructions:  Your physician recommends that you continue on your current medications as directed. Please refer to the Current Medication list given to you today.  Labwork: In 1-2 weeks   Testing/Procedures: None   Follow-Up: Your physician recommends that you schedule a follow-up appointment in: 6 months   Any Other Special Instructions Will Be Listed Below (If Applicable).  If you need a refill on your cardiac medications before your next appointment, please call your pharmacy.

## 2023-12-27 NOTE — Progress Notes (Signed)
 Called patient to discuss rescheduling LDCT. Patient does not wish to reschedule at this time, he is awaiting cardiology recommendation on how to proceed.

## 2024-01-05 DIAGNOSIS — D631 Anemia in chronic kidney disease: Secondary | ICD-10-CM | POA: Diagnosis not present

## 2024-01-05 DIAGNOSIS — N189 Chronic kidney disease, unspecified: Secondary | ICD-10-CM | POA: Diagnosis not present

## 2024-01-05 DIAGNOSIS — D696 Thrombocytopenia, unspecified: Secondary | ICD-10-CM | POA: Diagnosis not present

## 2024-01-05 DIAGNOSIS — T148XXA Other injury of unspecified body region, initial encounter: Secondary | ICD-10-CM | POA: Diagnosis not present

## 2024-01-05 DIAGNOSIS — R809 Proteinuria, unspecified: Secondary | ICD-10-CM | POA: Diagnosis not present

## 2024-01-06 LAB — CBC
Hematocrit: 45.6 % (ref 37.5–51.0)
Hemoglobin: 15.1 g/dL (ref 13.0–17.7)
MCH: 31.6 pg (ref 26.6–33.0)
MCHC: 33.1 g/dL (ref 31.5–35.7)
MCV: 95 fL (ref 79–97)
Platelets: 113 10*3/uL — ABNORMAL LOW (ref 150–450)
RBC: 4.78 x10E6/uL (ref 4.14–5.80)
RDW: 15.2 % (ref 11.6–15.4)
WBC: 7.1 10*3/uL (ref 3.4–10.8)

## 2024-01-07 ENCOUNTER — Ambulatory Visit: Admitting: Nurse Practitioner

## 2024-01-07 NOTE — Progress Notes (Deleted)
 Cardiology Office Note:  .   Date:  12/20/2023 ID:  Nicolas Berry, DOB [birth year], MRN [id number] PCP: [facility] Providers Cardiologist:  Nicolas Lander, MD Electrophysiologist:  Nicolas Byes, MD    History of Present Illness: Nicolas Berry is a 78 y.o. male with a PMH of PAF, CAD, hypertension, hyperlipidemia, valvular heart disease, dizziness, carotid bruits, COPD, CKD, history of GI bleed, who presents today for scheduled follow-up.  Last seen by Dr. Armida Berry on June 30, 2023.  He was pending plans for future watchman implant later that month.  While on Eliquis , he noted intermittent dark stools.  It was noted he had long history of dizziness with negative orthostatics.  He had received extensive workup by neuro, was felt to be not cardiac in etiology.  He underwent LAAO with Watchman 27 mm FLX Pro device placed on July 15, 2023.  Was restarted on Eliquis  with plans to continue for 45 days (until August 29, 2023), and at that time would stop Eliquis  and start Plavix  75 mg to complete 6 months of therapy until January 13, 2024.  Was noted that he did not require SBE prophylaxis as patient had full set of dentures.  TEE 08/2023 revealed EF 60 to 65%, moderate aortic valve stenosis with SAM, systolic anterior motion of anterior leaflet noted with LVOT turbulence, well-placed Watchman device with no leak noted.   11/01/2023 - Today he presents for scheduled follow-up.  He states his main concern is the bruising along his arms. Does notices some intermittent CP at times for at least the past 6 months. Says it is particularly noticed at rest, described as ache, and lasts a few minutes at a time, denies any specific triggers, and denies any radiation. 2/10 in intensity. Rare in occurrence and random when it happens. Does admit to intermittent SHOB noticed with walking uphill while playing golf and has been ongoing since he had his knee  surgery over 1 year ago. This DOE has been stable over time. Continues to note chronic and stable dizziness at times, says he has not been taking the meclizine  like he should be. Denies any palpitations, syncope, presyncope, orthopnea, PND, swelling or significant weight changes, acute bleeding, or claudication.  12/20/2023 -he presents today for follow-up.  Doing well, overall his chief concern is his bleeding/bruising.  Shows me a moist bandage on his left forearm.  Compliant with his medications and tells me he is taking Plavix . Denies any chest pain, shortness of breath, palpitations, syncope, presyncope, dizziness, orthopnea, PND, swelling or significant weight changes, or claudication.   ROS: Negative. See HPI.  SH: Smokes 2 packs per day. Very active and plays golf several days per week.   Studies Reviewed: Nicolas Berry    EKG: EKG is not ordered today.   TEE 08/2023:  IMPRESSIONS    1. Mixed subvalvular gradient and AS with SAM and trivial MR Mean AV graident is 15 mmHg peak 25 mmHg Peak LVOT graident is 11 mmHg.   2. Well placed 27 mm Watchman FLX device with stable 1/3 mitral shoulder no leak by color flow Average compression 15%.   3. Left ventricular ejection fraction, by estimation, is 60 to 65%. The  left ventricle has normal function.   4. Right ventricular systolic function is normal. The right ventricular  size is normal.   5. Left atrial size was mildly dilated. No left atrial/left atrial  appendage thrombus was detected.   6. Systolic anterior  motion of anterior leaflet noted with LVOT  turbulence. The mitral valve is abnormal. Trivial mitral valve  regurgitation.   7. The aortic valve is tricuspid. There is moderate calcification of the  aortic valve. There is moderate thickening of the aortic valve. Aortic  valve regurgitation is not visualized. Mild to moderate aortic valve  stenosis.  Echo 04/2023:  1. Left ventricular ejection fraction, by estimation, is 60 to 65%. The   left ventricle has normal function. The left ventricle has no regional  wall motion abnormalities. Left ventricular diastolic parameters are  consistent with Grade I diastolic  dysfunction (impaired relaxation).   2. Right ventricular systolic function is normal. The right ventricular  size is normal. There is mildly elevated pulmonary artery systolic  pressure.   3. The mitral valve is abnormal. Trivial mitral valve regurgitation. No  evidence of mitral stenosis. Severe mitral annular calcification.   4. The aortic valve is tricuspid. There is moderate calcification of the  aortic valve. Aortic valve regurgitation is not visualized. Aortic valve  sclerosis/calcification is present, without any evidence of aortic  stenosis. Aortic valve mean gradient  measures 10.0 mmHg. Aortic valve Vmax measures 2.02 m/s.   5. The inferior vena cava is normal in size with greater than 50%  respiratory variability, suggesting right atrial pressure of 3 mmHg.   Comparison(s): No significant change from prior study.  Lexiscan  04/2016:  There was no ST segment deviation noted during stress. No T wave inversion was noted during stress. The study is normal. This is a low risk study. The left ventricular ejection fraction is hyperdynamic (>65%).  Physical Exam:   VS:  There were no vitals taken for this visit.   Wt Readings from Last 3 Encounters:  12/20/23 153 lb 3.2 oz (69.5 kg)  11/01/23 157 lb (71.2 kg)  09/08/23 157 lb (71.2 kg)    GEN: Well nourished, well developed in no acute distress NECK: No JVD; No carotid bruits CARDIAC: S1/S2, RRR, no murmurs, rubs, gallops RESPIRATORY:  Clear to auscultation without rales, wheezing or rhonchi  ABDOMEN: Soft, non-tender, non-distended EXTREMITIES:  No edema; No deformity, thin skin and generalized bruising along bilateral forearms, bandaid with recent bleeding noted, small amount of blood on bandaid.  ASSESSMENT AND PLAN: .    PAF, s/p LAAO with  Watchman device (06/2023) TEE 08/2023 revealed well placed Watchman device with no leak noted. Denies any tachycardia or palpitations. HR is well controlled today. Continue Coreg  and Plavix , and was recommended to complete 6 months of post implant therapy for Plavix  with end date estimated to be end of April 2025 per office note on August 23, 2023 by Drema Genta, NP. Completed Eliquis  therapy. Heart healthy diet encouraged.  Will arrange follow-up with structural heart team as he is due to follow-up in April 2025.  He admits to easy bleeding/bruising-see below.  Will obtain CBC. Care and ED precautions discussed.   Aortic valve stenosis, DOE TEE 08/2023 revealed mild to moderate aortic valve stenosis. Admits to chronic and stable DOE while walking uphill - etiology multifactorial. Denies any red flag signs and symptoms. He is currently following Structural Heart Team. No medication changes at this time. Will arrange f/u with Structural Heart Team.   CAD Denies any chest pain. No indication for ischemic evaluation at this time. Most recent NST in 2017 was normal. Continue current medication regimen. Heart healthy diet encouraged. Care and ED precautions discussed.   HTN BP is stable. Discussed to monitor BP at home  at least 2 hours after medications and sitting for 5-10 minutes. No medication changes at this time. Heart healthy diet encouraged.  HLD No recent labs on file. Will request most recent labs from PCP's office. Continue rosuvastatin . Continue to follow with PCP. Heart healthy diet encouraged.   COPD, tobacco abuse Admits to stable, DOE while walking uphill for over the past year. Smokes 2 packs per day. Continue current medication regimen. Continue to follow with PCP. Care and ED precautions discussed. Smoking cessation encouraged and discussed.   8. CKD stage 3 Most recent labs stable. Avoid nephrotoxic agents. No medication changes at this time. Continue to follow-up with PCP and Dr.  Carrolyn Clan.   9. Bruising, bleeding easily, thrombocytopenia Pt has generalized bruising along bilateral forearms most likely d/t Plavix . Current platelet count is low but improving at 142, 000. Will obtain CBC. Denies any GI bleeding or coughing up blood or hematemesis. Continue to follow with PCP. Care and ED precautions discussed.   Dispo: Follow-up with Dr. Armida Berry or APP in 6 months or sooner if anything changes.   Signed, Lasalle Pointer, NP

## 2024-01-10 ENCOUNTER — Telehealth: Payer: Self-pay | Admitting: Nurse Practitioner

## 2024-01-10 ENCOUNTER — Ambulatory Visit: Payer: No Typology Code available for payment source

## 2024-01-10 NOTE — Telephone Encounter (Signed)
-----   Message from Lasalle Pointer sent at 01/10/2024 12:34 PM EDT ----- Please tell patient I have heard back from Dr. Amanda Jungling.  He stated okay to stop Plavix .  Please let patient know about this and let's remove this off his medication list.  Thanks!   Best, Lasalle Pointer, NP ----- Message ----- From: Laurann Pollock, MD Sent: 01/10/2024  10:17 AM EDT To: Lasalle Pointer, NP  Yes ok to stop plavix , looks like was only on for watchman so we can follow the recommendations from structural heart team to stop plavix    Letta Raw MD ----- Message ----- From: Lasalle Pointer, NP Sent: 01/07/2024   2:37 PM EDT To: Laurann Pollock, MD  Is patient okay to stop Plavix ? It was previously recommended to complete 6 months of post implant therapy for Plavix  with end date estimated to be end of April 2025 per office note on August 23, 2023 by Drema Genta, NP. Platelet count is down from before. Easy bruising/bleeding. Okay to d/c?   Thanks!   Best, Lasalle Pointer, NP

## 2024-01-10 NOTE — Telephone Encounter (Signed)
 Left detailed message per patient dpr. Removed plavix  off med list

## 2024-01-12 ENCOUNTER — Ambulatory Visit: Payer: No Typology Code available for payment source

## 2024-01-18 ENCOUNTER — Telehealth: Payer: Self-pay | Admitting: Physician Assistant

## 2024-01-18 NOTE — Telephone Encounter (Signed)
 Called to check in with patient, who had LAAO on 07/15/2023. Per review of phone note on 4/21, Nicolas Berry and Dr. Amanda Jungling stopped his Plavix  (which was 6 months out past Watchman) due to thrombocytopenia. Med list was updated. [facility] also updated.  The patient reports doing well with no issues.  The patient understands to call with questions or concerns.

## 2024-03-22 ENCOUNTER — Encounter: Payer: Self-pay | Admitting: *Deleted

## 2024-03-22 NOTE — Progress Notes (Signed)
 Attempted to reach patient regarding follow-up LDCT. Unable to reach patient directly. Detailed VM left asking that the patient return my call.

## 2024-03-27 DIAGNOSIS — H16142 Punctate keratitis, left eye: Secondary | ICD-10-CM | POA: Diagnosis not present

## 2024-03-30 ENCOUNTER — Encounter: Payer: Self-pay | Admitting: *Deleted

## 2024-03-30 NOTE — Progress Notes (Signed)
 Patient returned call to schedule LDCT screening exam.  He states that he spoke with the [facility] and they are going to pay for this exam.  He has asked them to get us  the paperwork approving this.  I have scheduled his exam for 4 weeks out to allow time to get this paperwork in to the pre-service center.  Patient is aware of date and time. I will send him a letter with this date and time as well.

## 2024-05-11 ENCOUNTER — Ambulatory Visit (HOSPITAL_COMMUNITY)
Admission: RE | Admit: 2024-05-11 | Discharge: 2024-05-11 | Disposition: A | Discharge status: Home / Self Care | Source: Ambulatory Visit | Attending: Physician Assistant | Admitting: Physician Assistant

## 2024-05-11 DIAGNOSIS — Z122 Encounter for screening for malignant neoplasm of respiratory organs: Secondary | ICD-10-CM | POA: Diagnosis present

## 2024-05-11 DIAGNOSIS — Z87891 Personal history of nicotine dependence: Secondary | ICD-10-CM | POA: Insufficient documentation

## 2024-05-19 DIAGNOSIS — N189 Chronic kidney disease, unspecified: Secondary | ICD-10-CM | POA: Diagnosis not present

## 2024-05-19 DIAGNOSIS — R809 Proteinuria, unspecified: Secondary | ICD-10-CM | POA: Diagnosis not present

## 2024-05-19 DIAGNOSIS — D631 Anemia in chronic kidney disease: Secondary | ICD-10-CM | POA: Diagnosis not present

## 2024-06-19 ENCOUNTER — Ambulatory Visit: Admitting: Nurse Practitioner

## 2024-07-06 ENCOUNTER — Encounter: Payer: Self-pay | Admitting: Nurse Practitioner

## 2024-07-06 ENCOUNTER — Ambulatory Visit: Attending: Nurse Practitioner | Admitting: Nurse Practitioner

## 2024-07-06 VITALS — BP 124/62 | HR 53 | Ht 69.0 in | Wt 148.2 lb

## 2024-07-06 DIAGNOSIS — I480 Paroxysmal atrial fibrillation: Secondary | ICD-10-CM | POA: Diagnosis not present

## 2024-07-06 DIAGNOSIS — R001 Bradycardia, unspecified: Secondary | ICD-10-CM | POA: Diagnosis not present

## 2024-07-06 DIAGNOSIS — J449 Chronic obstructive pulmonary disease, unspecified: Secondary | ICD-10-CM

## 2024-07-06 DIAGNOSIS — E785 Hyperlipidemia, unspecified: Secondary | ICD-10-CM

## 2024-07-06 DIAGNOSIS — I25119 Atherosclerotic heart disease of native coronary artery with unspecified angina pectoris: Secondary | ICD-10-CM

## 2024-07-06 DIAGNOSIS — R079 Chest pain, unspecified: Secondary | ICD-10-CM

## 2024-07-06 DIAGNOSIS — Z720 Tobacco use: Secondary | ICD-10-CM

## 2024-07-06 DIAGNOSIS — I10 Essential (primary) hypertension: Secondary | ICD-10-CM

## 2024-07-06 DIAGNOSIS — R0609 Other forms of dyspnea: Secondary | ICD-10-CM

## 2024-07-06 DIAGNOSIS — I350 Nonrheumatic aortic (valve) stenosis: Secondary | ICD-10-CM

## 2024-07-06 DIAGNOSIS — N1832 Chronic kidney disease, stage 3b: Secondary | ICD-10-CM

## 2024-07-06 MED ORDER — NITROGLYCERIN 0.4 MG SL SUBL: 0.4000 mg | SUBLINGUAL_TABLET | SUBLINGUAL | 3 refills | Status: AC | PRN

## 2024-07-06 NOTE — Progress Notes (Addendum)
 Cardiology Office Note:  .   Date:  07/06/2024 ID:  Nicolas Berry, DOB 09-May-1946, MRN [id number] PCP: [name] Providers Cardiologist:  [name], MD Electrophysiologist:  [name], MD    History of Present Illness: Nicolas Berry is a 78 y.o. male with a PMH of PAF, CAD, hypertension, hyperlipidemia, valvular heart disease, dizziness, carotid bruits, COPD, CKD, history of GI bleed, who presents today for scheduled follow-up.  Last seen by Dr. Carrier Alvan on June 30, 2023.  He was pending plans for future watchman implant later that month.  While on Eliquis , he noted intermittent dark stools.  It was noted he had long history of dizziness with negative orthostatics.  He had received extensive workup by neuro, was felt to be not cardiac in etiology.  He underwent LAAO with Watchman 27 mm FLX Pro device placed on July 15, 2023.  Was restarted on Eliquis  with plans to continue for 45 days (until August 29, 2023), and at that time would stop Eliquis  and start Plavix  75 mg to complete 6 months of therapy until January 13, 2024.  Was noted that he did not require SBE prophylaxis as patient had full set of dentures.  TEE 08/2023 revealed EF 60 to 65%, moderate aortic valve stenosis with SAM, systolic anterior motion of anterior leaflet noted with LVOT turbulence, well-placed Watchman device with no leak noted.   11/01/2023 - Today he presents for scheduled follow-up.  He states his main concern is the bruising along his arms. Does notices some intermittent CP at times for at least the past 6 months. Says it is particularly noticed at rest, described as ache, and lasts a few minutes at a time, denies any specific triggers, and denies any radiation. 2/10 in intensity. Rare in occurrence and random when it happens. Does admit to intermittent SHOB noticed with walking uphill while playing golf and has been ongoing since he had his knee  surgery over 1 year ago. This DOE has been stable over time. Continues to note chronic and stable dizziness at times, says he has not been taking the meclizine  like he should be. Denies any palpitations, syncope, presyncope, orthopnea, PND, swelling or significant weight changes, acute bleeding, or claudication.  12/20/2023 -he presents today for follow-up.  Doing well, overall his chief concern is his bleeding/bruising.  Shows me a moist bandage on his left forearm.  Compliant with his medications and tells me he is taking Plavix . Denies any chest pain, shortness of breath, palpitations, syncope, presyncope, dizziness, orthopnea, PND, swelling or significant weight changes, or claudication.   07/06/2024 -  Here for follow-up. Admits to chronic and stable feeling of dizziness and being off balance, has been ongoing for a few years. Bruising has improved since now being off Plavix . He does admit to occasional left-sided chest pressure noticed at rest, lasting a few minutes and then goes away on its own, denies any specific triggers.  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  Occasionally experiences choking on food and has cough/phlegm at times (recommended to speak with PCP about this).  Closely follows with Dr. Rachele. Denies any worsening shortness of breath or any recent palpitations, syncope, presyncope, orthopnea, PND, swelling or significant weight changes, acute bleeding, or claudication.  ROS: Negative. See HPI.  SH: Smokes 2 packs per day. Very active and plays golf several days per week.   Studies Reviewed: SABRA    EKG: EKG Interpretation Date/Time:  Thursday  July 06 2024 14:01:37 EDT Ventricular Rate:  51 PR Interval:  156 QRS Duration:  86 QT Interval:  444 QTC Calculation: 409 R Axis:   36  Text Interpretation: Sinus bradycardia with occasional Premature ventricular complexes ST & T wave abnormality, consider inferior ischemia When compared with ECG of  06-Jul-2024 13:49, No significant change was found Confirmed by Miriam Norris [phone]) on 07/07/2024 9:49:50 AM    TEE 08/2023:  IMPRESSIONS    1. Mixed subvalvular gradient and AS with SAM and trivial MR Mean AV graident is 15 mmHg peak 25 mmHg Peak LVOT graident is 11 mmHg.   2. Well placed 27 mm Watchman FLX device with stable 1/3 mitral shoulder no leak by color flow Average compression 15%.   3. Left ventricular ejection fraction, by estimation, is 60 to 65%. The  left ventricle has normal function.   4. Right ventricular systolic function is normal. The right ventricular  size is normal.   5. Left atrial size was mildly dilated. No left atrial/left atrial  appendage thrombus was detected.   6. Systolic anterior motion of anterior leaflet noted with LVOT  turbulence. The mitral valve is abnormal. Trivial mitral valve  regurgitation.   7. The aortic valve is tricuspid. There is moderate calcification of the  aortic valve. There is moderate thickening of the aortic valve. Aortic  valve regurgitation is not visualized. Mild to moderate aortic valve  stenosis.  Echo 04/2023:  1. Left ventricular ejection fraction, by estimation, is 60 to 65%. The  left ventricle has normal function. The left ventricle has no regional  wall motion abnormalities. Left ventricular diastolic parameters are  consistent with Grade I diastolic  dysfunction (impaired relaxation).   2. Right ventricular systolic function is normal. The right ventricular  size is normal. There is mildly elevated pulmonary artery systolic  pressure.   3. The mitral valve is abnormal. Trivial mitral valve regurgitation. No  evidence of mitral stenosis. Severe mitral annular calcification.   4. The aortic valve is tricuspid. There is moderate calcification of the  aortic valve. Aortic valve regurgitation is not visualized. Aortic valve  sclerosis/calcification is present, without any evidence of aortic  stenosis. Aortic valve  mean gradient  measures 10.0 mmHg. Aortic valve Vmax measures 2.02 m/s.   5. The inferior vena cava is normal in size with greater than 50%  respiratory variability, suggesting right atrial pressure of 3 mmHg.   Comparison(s): No significant change from prior study.  Lexiscan  04/2016:  There was no ST segment deviation noted during stress. No T wave inversion was noted during stress. The study is normal. This is a low risk study. The left ventricular ejection fraction is hyperdynamic (>65%).  Physical Exam:   VS:  BP 124/62   Pulse (!) 53   Ht 5' 9 (1.753 m)   Wt 148 lb 3.2 oz (67.2 kg)   SpO2 98%   BMI 21.89 kg/m    Wt Readings from Last 3 Encounters:  07/06/24 148 lb 3.2 oz (67.2 kg)  12/20/23 153 lb 3.2 oz (69.5 kg)  11/01/23 157 lb (71.2 kg)    GEN: Well nourished, well developed in no acute distress NECK: No JVD; No carotid bruits CARDIAC: S1/S2, RRR, Grade 2 murmur, no rubs, no gallops RESPIRATORY:  Clear to auscultation without rales, wheezing or rhonchi  ABDOMEN: Soft, non-tender, non-distended EXTREMITIES:  No edema; No deformity, thin skin  ASSESSMENT AND PLAN: .    Chest pain of uncertain etiology, CAD Does admit  to some occasional/seldom chest pain, EKG today reveals ST segment ST/T wave abnormality, no previously seen on past EKG. did discuss ischemic evaluation including nuclear stress testing and discussed risk versus benefits, and he verbalized understanding is agreeable to proceed.  Will arrange Lexiscan . Will write Rx for NTG PRN for chest pain. Stopping Coreg  as noted below. Continue rest of medication regimen. Care and ED precautions discussed.   Informed Consent   Shared Decision Making/Informed Consent The risks [chest pain, shortness of breath, cardiac arrhythmias, dizziness, blood pressure fluctuations, myocardial infarction, stroke/transient ischemic attack, nausea, vomiting, allergic reaction, radiation exposure, metallic taste sensation and  life-threatening complications (estimated to be 1 in 10,000)], benefits (risk stratification, diagnosing coronary artery disease, treatment guidance) and alternatives of a nuclear stress test were discussed in detail with Nicolas Berry and he agrees to proceed.    PAF, s/p LAAO with Watchman device (06/2023), bradycardia TEE 08/2023 revealed well placed Watchman device with no leak noted. Denies any tachycardia or palpitations. HR is well controlled today, he is SB on EKG. Completed Eliquis  therapy. Heart healthy diet encouraged. Stopping Coreg  as noted below d/t his symptoms. Care and ED precautions discussed.   Aortic valve stenosis TEE 08/2023 revealed mild to moderate aortic valve stenosis. Plan to update Echo in 1 year, December 2025 or sooner if needed.   HTN BP is stable. Discussed to monitor BP at home at least 2 hours after medications and sitting for 5-10 minutes. Stopping Coreg  as he notes dizziness/feeling off balance. No other medication changes at this time. Heart healthy diet encouraged.  HLD No recent labs on file. Will request most recent labs from PCP's office. Continue rosuvastatin . Continue to follow with PCP. Heart healthy diet encouraged.   COPD, DOE, tobacco abuse Admits to stable, DOE while walking uphill for over the past year. Smokes 2 packs per day. Continue current medication regimen. Continue to follow with PCP. Care and ED precautions discussed. Smoking cessation encouraged and discussed.   7. CKD stage 3b Most recent labs stable. Avoid nephrotoxic agents. No medication changes at this time. Continue to follow-up with PCP and Dr. Rachele.   8. Dizziness, poor balance Will d/c Coreg  to see if symptoms improve.  Etiology multifactorial.  Recommended to also discuss with PCP for further evaluation.  Care and ED precautions discussed.  He verbalized understanding.  Dispo: Follow-up with Dr. Dorn Ross or APP in 6-8 weeks or sooner if anything changes.    Signed, Almarie Crate, NP

## 2024-07-06 NOTE — Patient Instructions (Addendum)
 Medication Instructions:  Your physician has recommended you make the following change in your medication:  Please STOP your Carvedilol   The proper use and anticipated side effects of nitroglycerin has been carefully explained.  If a single episode of chest pain is not relieved by one tablet, the patient will try another within 5 minutes; and if this doesn't relieve the pain, the patient is instructed to call 911 for transportation to an emergency department.  Labwork: None   Testing/Procedures: Your physician has requested that you have a lexiscan  myoview . For further information please visit [email or web address] Please follow instruction sheet, as given.  Follow-Up: Your physician recommends that you schedule a follow-up appointment in: 6-8 weeks  Any Other Special Instructions Will Be Listed Below (If Applicable).  If you need a refill on your cardiac medications before your next appointment, please call your pharmacy.

## 2024-07-12 ENCOUNTER — Encounter (HOSPITAL_COMMUNITY)

## 2024-07-12 ENCOUNTER — Encounter (HOSPITAL_COMMUNITY): Admission: RE | Admit: 2024-07-12 | Source: Ambulatory Visit

## 2024-07-19 ENCOUNTER — Telehealth: Payer: Self-pay | Admitting: Cardiology

## 2024-07-19 DIAGNOSIS — R079 Chest pain, unspecified: Secondary | ICD-10-CM

## 2024-07-19 DIAGNOSIS — R0609 Other forms of dyspnea: Secondary | ICD-10-CM

## 2024-07-19 NOTE — Telephone Encounter (Signed)
 Advised to remain off carvedilol . Advised that message would be sent to provider for recommendations for his stress test.

## 2024-07-19 NOTE — Telephone Encounter (Signed)
 Patient was transferred to me to r/s his test. The patient stated that he does not want to to the nuclear medicine portion of the test. He said he has done it before and does not like it. He said he would try to do the treadmill. Does there need to be a new order put in before r/s? Also the patient stated that Dr. Alvan took him off Carvedilol  and he wants to know if he should start taking it again. [phone] is the best number to reach the pt.

## 2024-07-19 NOTE — Telephone Encounter (Signed)
 Patient called to report he canceled his NM MYO MULT SPECT and will be rescheduling his CV MYOCARDIAL PERFUSION.

## 2024-07-20 ENCOUNTER — Inpatient Hospital Stay (HOSPITAL_COMMUNITY): Admission: RE | Admit: 2024-07-20 | Source: Ambulatory Visit

## 2024-07-20 ENCOUNTER — Encounter (HOSPITAL_COMMUNITY)

## 2024-07-21 ENCOUNTER — Encounter: Payer: Self-pay | Admitting: *Deleted

## 2024-07-21 NOTE — Telephone Encounter (Signed)
 Patient informed and verbalized understanding of plan. Routed to schedulers to arrange GXT.

## 2024-07-24 NOTE — Progress Notes (Signed)
 Artavius Stearns                                          MRN: [id number]   07/24/2024   The [facility] Quality Team Specialist reviewed this patient medical record for the purposes of chart review for care gap closure. The following were reviewed: chart review for care gap closure-kidney health evaluation for diabetes:eGFR  and uACR.    [facility] Quality Team

## 2024-07-24 NOTE — Addendum Note (Signed)
 Addended by: Reagen Haberman M on: 07/24/2024 10:04 AM   Modules accepted: Orders

## 2024-07-25 NOTE — Telephone Encounter (Signed)
 Discussed GXT instructions with patient and advised that he should hold his metformin  on the morning of the test as mentioned previously and per instruction letter.  Reports he has an appointment with Dr. Rachele on Friday, 07/28/2024 and is unable to keep GXT appointment. Request to have GXT appointment rescheduled to next week. Will forward to scheduler.

## 2024-07-26 ENCOUNTER — Telehealth: Payer: Self-pay | Admitting: Nurse Practitioner

## 2024-07-26 NOTE — Telephone Encounter (Signed)
 Checking percert on the following patient for testing scheduled at [facility].    GXT   08/01/2024

## 2024-07-28 ENCOUNTER — Ambulatory Visit (HOSPITAL_COMMUNITY)

## 2024-08-01 ENCOUNTER — Ambulatory Visit (HOSPITAL_COMMUNITY)
Admission: RE | Admit: 2024-08-01 | Discharge: 2024-08-01 | Disposition: A | Discharge status: Home / Self Care | Source: Ambulatory Visit | Attending: Nurse Practitioner | Admitting: Nurse Practitioner

## 2024-08-01 ENCOUNTER — Telehealth: Payer: Self-pay | Admitting: Student

## 2024-08-01 DIAGNOSIS — R0609 Other forms of dyspnea: Secondary | ICD-10-CM | POA: Diagnosis present

## 2024-08-01 DIAGNOSIS — R079 Chest pain, unspecified: Secondary | ICD-10-CM | POA: Insufficient documentation

## 2024-08-01 LAB — EXERCISE TOLERANCE TEST
Angina Index: 0
Estimated workload: 4.6
Exercise duration (min): 2 min
Exercise duration (sec): 44 s
MPHR: 143 {beats}/min
Peak HR: 136 {beats}/min
Percent HR: 95 %
RPE: 15
Rest HR: 53 {beats}/min

## 2024-08-01 NOTE — Telephone Encounter (Signed)
     Nicolas Berry presented for a treadmill stress test (GXT) today.  I Laymon CHRISTELLA Qua, PA-C, provided direct supervision and was present during the study today, which was completed without significant symptoms, immediate complications, or acute ST/T changes on ECG.  Official results are pending at this time.  Preliminary ECG findings may be listed in the chart, but the stress test result will not be finalized until read by the Cardiologist.  Laymon CHRISTELLA Qua, PA-C  08/01/2024, 11:14 AM

## 2024-08-11 ENCOUNTER — Ambulatory Visit: Payer: Self-pay | Admitting: Nurse Practitioner

## 2024-08-28 ENCOUNTER — Encounter: Payer: Self-pay | Admitting: Nurse Practitioner

## 2024-08-28 ENCOUNTER — Ambulatory Visit: Attending: Nurse Practitioner | Admitting: Nurse Practitioner

## 2024-08-28 VITALS — BP 135/60 | HR 76 | Ht 70.5 in | Wt 157.4 lb

## 2024-08-28 DIAGNOSIS — I10 Essential (primary) hypertension: Secondary | ICD-10-CM

## 2024-08-28 DIAGNOSIS — J449 Chronic obstructive pulmonary disease, unspecified: Secondary | ICD-10-CM

## 2024-08-28 DIAGNOSIS — I2510 Atherosclerotic heart disease of native coronary artery without angina pectoris: Secondary | ICD-10-CM

## 2024-08-28 DIAGNOSIS — I350 Nonrheumatic aortic (valve) stenosis: Secondary | ICD-10-CM | POA: Diagnosis not present

## 2024-08-28 DIAGNOSIS — R42 Dizziness and giddiness: Secondary | ICD-10-CM

## 2024-08-28 DIAGNOSIS — R2689 Other abnormalities of gait and mobility: Secondary | ICD-10-CM

## 2024-08-28 DIAGNOSIS — Z720 Tobacco use: Secondary | ICD-10-CM

## 2024-08-28 DIAGNOSIS — N1832 Chronic kidney disease, stage 3b: Secondary | ICD-10-CM

## 2024-08-28 DIAGNOSIS — R001 Bradycardia, unspecified: Secondary | ICD-10-CM | POA: Diagnosis not present

## 2024-08-28 DIAGNOSIS — I480 Paroxysmal atrial fibrillation: Secondary | ICD-10-CM

## 2024-08-28 DIAGNOSIS — R0609 Other forms of dyspnea: Secondary | ICD-10-CM

## 2024-08-28 NOTE — Patient Instructions (Signed)
 Medication Instructions:  Your physician recommends that you continue on your current medications as directed. Please refer to the Current Medication list given to you today.   Labwork: None  Testing/Procedures: None  Follow-Up: Your physician recommends that you schedule a follow-up appointment in: 3 months  Any Other Special Instructions Will Be Listed Below (If Applicable). Please drop off blood pressure log in 2 weeks at the office   Thank you for choosing [facility]!     If you need a refill on your cardiac medications before your next appointment, please call your pharmacy.

## 2024-08-28 NOTE — Progress Notes (Signed)
 Cardiology Office Note:  .   Date:  08/28/2024 ID:  Nicolas Berry, DOB 09-26-1945, MRN [id number] PCP: [name] Providers Cardiologist:  [name], MD Electrophysiologist:  [name], MD    History of Present Illness: Nicolas Berry is a 78 y.o. male with a PMH of PAF, CAD, hypertension, hyperlipidemia, valvular heart disease, dizziness, carotid bruits, COPD, CKD, history of GI bleed, who presents today for scheduled follow-up.  Last seen by Dr. Carrier Alvan on June 30, 2023.  He was pending plans for future watchman implant later that month.  While on Eliquis , he noted intermittent dark stools.  It was noted he had long history of dizziness with negative orthostatics.  He had received extensive workup by neuro, was felt to be not cardiac in etiology.  He underwent LAAO with Watchman 27 mm FLX Pro device placed on July 15, 2023.  Was restarted on Eliquis  with plans to continue for 45 days (until August 29, 2023), and at that time would stop Eliquis  and start Plavix  75 mg to complete 6 months of therapy until January 13, 2024.  Was noted that he did not require SBE prophylaxis as patient had full set of dentures.  TEE 08/2023 revealed EF 60 to 65%, moderate aortic valve stenosis with SAM, systolic anterior motion of anterior leaflet noted with LVOT turbulence, well-placed Watchman device with no leak noted.   11/01/2023 - Today he presents for scheduled follow-up.  He states his main concern is the bruising along his arms. Does notices some intermittent CP at times for at least the past 6 months. Says it is particularly noticed at rest, described as ache, and lasts a few minutes at a time, denies any specific triggers, and denies any radiation. 2/10 in intensity. Rare in occurrence and random when it happens. Does admit to intermittent SHOB noticed with walking uphill while playing golf and has been ongoing since he had his knee  surgery over 1 year ago. This DOE has been stable over time. Continues to note chronic and stable dizziness at times, says he has not been taking the meclizine  like he should be. Denies any palpitations, syncope, presyncope, orthopnea, PND, swelling or significant weight changes, acute bleeding, or claudication.  12/20/2023 -he presents today for follow-up.  Doing well, overall his chief concern is his bleeding/bruising.  Shows me a moist bandage on his left forearm.  Compliant with his medications and tells me he is taking Plavix . Denies any chest pain, shortness of breath, palpitations, syncope, presyncope, dizziness, orthopnea, PND, swelling or significant weight changes, or claudication.   07/06/2024 -  Here for follow-up. Admits to chronic and stable feeling of dizziness and being off balance, has been ongoing for a few years. Bruising has improved since now being off Plavix . He does admit to occasional left-sided chest pressure noticed at rest, lasting a few minutes and then goes away on its own, denies any specific triggers.  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  Occasionally experiences choking on food and has cough/phlegm at times (recommended to speak with PCP about this).  Closely follows with Dr. Rachele. Denies any worsening shortness of breath or any recent palpitations, syncope, presyncope, orthopnea, PND, swelling or significant weight changes, acute bleeding, or claudication.  08/28/2024 - Overall doing well.  Does tell me that he was taken off losartan  recently.  Being closely followed by Dr. Rachele.  Says he has been having constant dizziness and chronic left  earache for close to a year, going to be seeing an ENT soon.  Overall doing well from a cardiac standpoint.  His shortness of breath is stable. Denies any chest pain, palpitations, syncope, presyncope, dizziness, orthopnea, PND, swelling or significant weight changes, acute bleeding, or  claudication.  ROS: Negative. See HPI.  SH: Smokes 2 packs per day. Very active and plays golf several days per week.   Studies Reviewed: SABRA    EKG: EKG is not ordered today.    ETT 07/2024:   Patient exercised for 2 minutes and 44 seconds, per modified Bruce protocol, achieving 4.60 METS.   Elevated BP response (141/82 mm Hg to 197/78 mm Hg) and below average exercise capacity.   No angina during the stress test. Test was stopped due to leg pain and shortness of breath.   Significant artifact during the stress test. Non-diagnostic for ischemia evaluation, hence. Consider alternative testing modalities.    TEE 08/2023:  IMPRESSIONS    1. Mixed subvalvular gradient and AS with SAM and trivial MR Mean AV graident is 15 mmHg peak 25 mmHg Peak LVOT graident is 11 mmHg.   2. Well placed 27 mm Watchman FLX device with stable 1/3 mitral shoulder no leak by color flow Average compression 15%.   3. Left ventricular ejection fraction, by estimation, is 60 to 65%. The  left ventricle has normal function.   4. Right ventricular systolic function is normal. The right ventricular  size is normal.   5. Left atrial size was mildly dilated. No left atrial/left atrial  appendage thrombus was detected.   6. Systolic anterior motion of anterior leaflet noted with LVOT  turbulence. The mitral valve is abnormal. Trivial mitral valve  regurgitation.   7. The aortic valve is tricuspid. There is moderate calcification of the  aortic valve. There is moderate thickening of the aortic valve. Aortic  valve regurgitation is not visualized. Mild to moderate aortic valve  stenosis.  Echo 04/2023:  1. Left ventricular ejection fraction, by estimation, is 60 to 65%. The  left ventricle has normal function. The left ventricle has no regional  wall motion abnormalities. Left ventricular diastolic parameters are  consistent with Grade I diastolic  dysfunction (impaired relaxation).   2. Right ventricular systolic  function is normal. The right ventricular  size is normal. There is mildly elevated pulmonary artery systolic  pressure.   3. The mitral valve is abnormal. Trivial mitral valve regurgitation. No  evidence of mitral stenosis. Severe mitral annular calcification.   4. The aortic valve is tricuspid. There is moderate calcification of the  aortic valve. Aortic valve regurgitation is not visualized. Aortic valve  sclerosis/calcification is present, without any evidence of aortic  stenosis. Aortic valve mean gradient  measures 10.0 mmHg. Aortic valve Vmax measures 2.02 m/s.   5. The inferior vena cava is normal in size with greater than 50%  respiratory variability, suggesting right atrial pressure of 3 mmHg.   Comparison(s): No significant change from prior study.  Lexiscan  04/2016:  There was no ST segment deviation noted during stress. No T wave inversion was noted during stress. The study is normal. This is a low risk study. The left ventricular ejection fraction is hyperdynamic (>65%).  Physical Exam:   VS:  BP 135/60   Pulse 76   Ht 5' 10.5 (1.791 m)   Wt 157 lb 6.4 oz (71.4 kg)   SpO2 95%   BMI 22.27 kg/m    Wt Readings from Last 3 Encounters:  08/28/24 157 lb 6.4 oz (71.4 kg)  07/06/24 148 lb 3.2 oz (67.2 kg)  12/20/23 153 lb 3.2 oz (69.5 kg)    GEN: Well nourished, well developed in no acute distress NECK: No JVD; No carotid bruits CARDIAC: S1/S2, RRR, Grade 2 murmur, no rubs, no gallops RESPIRATORY:  Clear to auscultation without rales, wheezing or rhonchi  ABDOMEN: Soft, non-tender, non-distended EXTREMITIES:  No edema; No deformity, thin skin  ASSESSMENT AND PLAN: .    CAD Denies any recent/current chest pain.  EKG in the past revealed ST segment ST/T wave abnormality, no previously seen on past EKG. did discuss ischemic evaluation and patient was adamant about exercise tolerance test that was inconclusive due to the fact that he cannot complete this test.  Was  nondiagnostic for ischemic evaluation.  Very high threshold for cardiac cath due to his recent progression in CKD.  Will continue to monitor at this time.  No medication changes at this time. Care and ED precautions discussed.      PAF, s/p LAAO with Watchman device (06/2023), bradycardia TEE 08/2023 revealed well placed Watchman device with no leak noted. Denies any tachycardia or palpitations. HR is well controlled today. Completed Eliquis  therapy. Heart healthy diet encouraged. Care and ED precautions discussed.   Aortic valve stenosis TEE 08/2023 revealed mild to moderate aortic valve stenosis. Plan to update Echo at next office visit.   HTN BP is stable on recheck. Discussed to monitor BP at home at least 2 hours after medications and sitting for 5-10 minutes.  Medical therapy is limited to his chronic symptoms.  No other medication changes at this time. Heart healthy diet encouraged.  He will update us  in 2 weeks regarding his BP readings.  HLD No recent labs on file. Will request most recent labs from PCP's office. Continue rosuvastatin . Continue to follow with PCP. Heart healthy diet encouraged.   COPD, DOE, tobacco abuse Admits to stable, DOE while walking uphill for over the past year. Smokes 2 packs per day. Continue current medication regimen. Continue to follow with PCP. Care and ED precautions discussed. Smoking cessation encouraged and discussed.   7. CKD stage 3b Most recent labs stable. Avoid nephrotoxic agents. No medication changes at this time. Continue to follow-up with PCP and Dr. Rachele.   8. Dizziness, poor balance Chronic per his report. Etiology multifactorial.  He will be seeing ENT soon.  Continue follow-up with ENT and PCP.  Dispo: Follow-up with Dr. Dorn Ross or APP in 3 months or sooner if anything changes.   Signed, Almarie Crate, NP

## 2024-10-03 ENCOUNTER — Other Ambulatory Visit (HOSPITAL_COMMUNITY): Payer: Self-pay | Admitting: Nurse Practitioner

## 2024-10-03 DIAGNOSIS — R519 Headache, unspecified: Secondary | ICD-10-CM

## 2024-10-11 ENCOUNTER — Ambulatory Visit (HOSPITAL_COMMUNITY): Admission: RE | Admit: 2024-10-11 | Source: Ambulatory Visit

## 2024-10-19 ENCOUNTER — Encounter (HOSPITAL_COMMUNITY): Payer: Self-pay

## 2024-10-20 ENCOUNTER — Ambulatory Visit (HOSPITAL_COMMUNITY)
Admission: RE | Admit: 2024-10-20 | Discharge: 2024-10-20 | Disposition: A | Source: Ambulatory Visit | Attending: Nurse Practitioner | Admitting: Nurse Practitioner

## 2024-10-20 DIAGNOSIS — R42 Dizziness and giddiness: Secondary | ICD-10-CM

## 2024-10-20 DIAGNOSIS — R200 Anesthesia of skin: Secondary | ICD-10-CM

## 2024-10-20 DIAGNOSIS — R519 Headache, unspecified: Secondary | ICD-10-CM | POA: Insufficient documentation

## 2024-11-21 ENCOUNTER — Ambulatory Visit: Admitting: Nurse Practitioner
